# Patient Record
Sex: Female | Born: 2000 | Race: Black or African American | Hispanic: No | Smoking: Never smoker
Health system: Southern US, Community
[De-identification: ages and names within clinical notes are randomized; demographics above are authoritative.]

## PROBLEM LIST (undated history)

## (undated) ENCOUNTER — Inpatient Hospital Stay (HOSPITAL_COMMUNITY): Payer: Self-pay

## (undated) ENCOUNTER — Ambulatory Visit (HOSPITAL_COMMUNITY): Admission: EM

## (undated) DIAGNOSIS — F32A Depression, unspecified: Secondary | ICD-10-CM

## (undated) DIAGNOSIS — O009 Unspecified ectopic pregnancy without intrauterine pregnancy: Secondary | ICD-10-CM

## (undated) DIAGNOSIS — R4689 Other symptoms and signs involving appearance and behavior: Secondary | ICD-10-CM

## (undated) DIAGNOSIS — N83209 Unspecified ovarian cyst, unspecified side: Secondary | ICD-10-CM

## (undated) DIAGNOSIS — F419 Anxiety disorder, unspecified: Secondary | ICD-10-CM

## (undated) DIAGNOSIS — R519 Headache, unspecified: Secondary | ICD-10-CM

## (undated) DIAGNOSIS — O24419 Gestational diabetes mellitus in pregnancy, unspecified control: Secondary | ICD-10-CM

## (undated) DIAGNOSIS — F909 Attention-deficit hyperactivity disorder, unspecified type: Secondary | ICD-10-CM

## (undated) HISTORY — DX: Attention-deficit hyperactivity disorder, unspecified type: F90.9

## (undated) HISTORY — DX: Other symptoms and signs involving appearance and behavior: R46.89

---
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
## 2020-01-04 ENCOUNTER — Ambulatory Visit (HOSPITAL_COMMUNITY)
Admission: EM | Admit: 2020-01-04 | Discharge: 2020-01-04 | Disposition: A | Discharge status: Home / Self Care | Payer: Medicaid Other | Attending: Family Medicine | Admitting: Family Medicine

## 2020-01-04 ENCOUNTER — Encounter (HOSPITAL_COMMUNITY): Payer: Self-pay | Admitting: Emergency Medicine

## 2020-01-04 ENCOUNTER — Other Ambulatory Visit: Payer: Self-pay

## 2020-01-04 DIAGNOSIS — Z3202 Encounter for pregnancy test, result negative: Secondary | ICD-10-CM | POA: Diagnosis not present

## 2020-01-04 DIAGNOSIS — Z113 Encounter for screening for infections with a predominantly sexual mode of transmission: Secondary | ICD-10-CM | POA: Diagnosis present

## 2020-01-04 DIAGNOSIS — R197 Diarrhea, unspecified: Secondary | ICD-10-CM | POA: Diagnosis not present

## 2020-01-04 LAB — POCT URINALYSIS DIPSTICK, ED / UC
Bilirubin Urine: NEGATIVE
Glucose, UA: NEGATIVE mg/dL
Hgb urine dipstick: NEGATIVE
Ketones, ur: NEGATIVE mg/dL
Leukocytes,Ua: NEGATIVE
Nitrite: NEGATIVE
Protein, ur: 30 mg/dL — AB
Specific Gravity, Urine: >=1.03 (ref 1.005–1.030)
Urobilinogen, UA: 0.2 mg/dL (ref 0.0–1.0)
pH: 5.5 (ref 5.0–8.0)

## 2020-01-04 LAB — HIV ANTIBODY (ROUTINE TESTING W REFLEX): HIV Screen 4th Generation wRfx: NONREACTIVE

## 2020-01-04 LAB — POC URINE PREG, ED: Preg Test, Ur: NEGATIVE

## 2020-01-04 NOTE — ED Provider Notes (Signed)
[facility]    CSN: [id number] Arrival date & time: 01/04/20  9379      History   Chief Complaint No chief complaint on file. pregnancy test, STD testing, Diarrhea  HPI Bethany Avery is a 19 y.o. female presenting today for pregnancy testing and STD testing.  Patient reports that her last menstrual cycle was 11/30/2019.  She is not on birth control.  Took 2 pregnancy tests at home reporting 1 was positive hormone was negative.  She is here for clarification.  She denies any pelvic symptoms of abdominal pain, abnormal bleeding, abnormal discharge.  Denies urinary symptoms of dysuria, increased frequency or urgency.  She would like to be screened for STDs.  Does report some diarrhea which has been going on for approximately 2 weeks.  Typically will have bowel movements after eating anything, denies blood in stool.  Denies associated abdominal pain.  Denies nausea or vomiting.  Denies fevers.  HPI  Past Medical History:  Diagnosis Date  . ADHD (attention deficit hyperactivity disorder)   . Gonorrhea 02/02/2018    Patient Active Problem List   Diagnosis Date Noted  . General counseling and advice on female contraception 02/02/2018  . Gonorrhea 02/02/2018  . Adolescent behavior problem     History reviewed. No pertinent surgical history.  OB History    Gravida  1   Para      Term      Preterm      AB  1   Living        SAB  1   TAB      Ectopic      Multiple      Live Births               Home Medications    Prior to Admission medications   Medication Sig Start Date End Date Taking? Authorizing Provider  fluticasone (FLONASE) 50 MCG/ACT nasal spray Place 1-2 sprays into both nostrils daily for 7 days. 11/23/19 11/30/19  Tambra Muller, Junius Creamer, PA-C    Family History Family History  Problem Relation Age of Onset  . Healthy Mother   . Healthy Father     Social History Social History   Tobacco Use  . Smoking status: Never Smoker  . Smokeless  tobacco: Never Used  Vaping Use  . Vaping Use: Never used  Substance Use Topics  . Alcohol use: No  . Drug use: Yes    Types: Marijuana    Comment: LAST SMOKED ON  MONDAY     Allergies   Nickel   Review of Systems Review of Systems  Constitutional: Negative for fever.  Respiratory: Negative for shortness of breath.   Cardiovascular: Negative for chest pain.  Gastrointestinal: Positive for diarrhea. Negative for abdominal pain, nausea and vomiting.  Genitourinary: Positive for menstrual problem. Negative for dysuria, flank pain, genital sores, hematuria, vaginal bleeding, vaginal discharge and vaginal pain.  Musculoskeletal: Negative for back pain.  Skin: Negative for rash.  Neurological: Negative for dizziness, light-headedness and headaches.     Physical Exam Triage Vital Signs ED Triage Vitals  Enc Vitals Group     BP 01/04/20 1037 105/76     Pulse Rate 01/04/20 1037 73     Resp 01/04/20 1037 17     Temp 01/04/20 1037 98.4 F (36.9 C)     Temp Source 01/04/20 1037 Oral     SpO2 01/04/20 1037 100 %     Weight --  Height --      Head Circumference --      Peak Flow --      Pain Score 01/04/20 1036 0     Pain Loc --      Pain Edu? --      Excl. in GC? --    No data found.  Updated Vital Signs BP 105/76 (BP Location: Right Arm)   Pulse 73   Temp 98.4 F (36.9 C) (Oral)   Resp 17   LMP 11/30/2019 (Exact Date)   SpO2 100%   Visual Acuity Right Eye Distance:   Left Eye Distance:   Bilateral Distance:    Right Eye Near:   Left Eye Near:    Bilateral Near:     Physical Exam Vitals and nursing note reviewed.  Constitutional:      Appearance: She is well-developed.     Comments: No acute distress  HENT:     Head: Normocephalic and atraumatic.     Nose: Nose normal.  Eyes:     Conjunctiva/sclera: Conjunctivae normal.  Cardiovascular:     Rate and Rhythm: Normal rate.  Pulmonary:     Effort: Pulmonary effort is normal. No respiratory distress.      Comments: Breathing comfortably at rest, CTABL, no wheezing, rales or other adventitious sounds auscultated Abdominal:     General: There is no distension.     Comments: Soft, nondistended, nontender to light and deep palpation throughout abdomen  Musculoskeletal:        General: Normal range of motion.     Cervical back: Neck supple.  Skin:    General: Skin is warm and dry.  Neurological:     Mental Status: She is alert and oriented to person, place, and time.      UC Treatments / Results  Labs (all labs ordered are listed, but only abnormal results are displayed) Labs Reviewed  POCT URINALYSIS DIPSTICK, ED / UC - Abnormal; Notable for the following components:      Result Value   Protein, ur 30 (*)    All other components within normal limits  HIV ANTIBODY (ROUTINE TESTING W REFLEX)  RPR  POC URINE PREG, ED  CERVICOVAGINAL ANCILLARY ONLY    EKG   Radiology No results found.  Procedures Procedures (including critical care time)  Medications Ordered in UC Medications - No data to display  Initial Impression / Assessment and Plan / UC Course  I have reviewed the triage vital signs and the nursing notes.  Pertinent labs & imaging results that were available during my care of the patient were reviewed by me and considered in my medical decision making (see chart for details).     Pregnancy test negative, UA unremarkable.  Vaginal swab pending for screening of gonorrhea chlamydia and trichomonas.  HIV and RPR blood work pending.  Will call with results and alter treatment as needed.  Recommending probiotics pushing fluids and continued monitoring of diarrhea.  Without abdominal pain.  Symptoms x2 weeks, do not suspect infectious etiology.  Discussed strict return precautions. Patient verbalized understanding and is agreeable with plan.  Final Clinical Impressions(s) / UC Diagnoses   Final diagnoses:  Encounter for pregnancy test with result negative  Screen  for STD (sexually transmitted disease)  Diarrhea, unspecified type     Discharge Instructions     Pregnancy test negative  We are testing you for Gonorrhea, Chlamydia, Trichomonas, Yeast and Bacterial Vaginosis. We will call you if anything is positive and let  you know if you require any further treatment. Please inform partners of any positive results.   Diarrhea-try daily probiotics, daily yogurt/activity, avoid problematic foods-see attached, drink plenty of fluids  Please return if symptoms not improving with treatment, development of fever, nausea, vomiting, abdominal pain.      ED Prescriptions    None     PDMP not reviewed this encounter.   Lew Dawes, PA-C 01/04/20 1124

## 2020-01-04 NOTE — ED Triage Notes (Signed)
Pt presents with concern of pregnancy. States she has taken two home pregnancy test, one resulted negative, and one resulted positive.  Denies vaginal irritation, itching, discharge, burning, blood in urine, or dysuria.   States she has has diarrhea for 1-2 wks. C/o of abdominal and lower back pain and headache on and off. Denies N or V, loss of taste or smell. Pt would like to have pregnancy test and full STD testing.

## 2020-01-04 NOTE — Discharge Instructions (Signed)
Pregnancy test negative  We are testing you for Gonorrhea, Chlamydia, Trichomonas, Yeast and Bacterial Vaginosis. We will call you if anything is positive and let you know if you require any further treatment. Please inform partners of any positive results.   Diarrhea-try daily probiotics, daily yogurt/activity, avoid problematic foods-see attached, drink plenty of fluids  Please return if symptoms not improving with treatment, development of fever, nausea, vomiting, abdominal pain.

## 2020-01-05 ENCOUNTER — Telehealth (HOSPITAL_COMMUNITY): Payer: Self-pay

## 2020-01-05 LAB — CERVICOVAGINAL ANCILLARY ONLY
Bacterial Vaginitis (gardnerella): POSITIVE — AB
Candida Glabrata: NEGATIVE
Candida Vaginitis: NEGATIVE
Chlamydia: NEGATIVE
Comment: NEGATIVE
Comment: NEGATIVE
Comment: NEGATIVE
Comment: NEGATIVE
Comment: NEGATIVE
Comment: NORMAL
Neisseria Gonorrhea: POSITIVE — AB
Trichomonas: NEGATIVE

## 2020-01-05 LAB — RPR: RPR Ser Ql: NONREACTIVE

## 2020-01-05 MED ORDER — METRONIDAZOLE 500 MG PO TABS: 500.0000 mg | ORAL_TABLET | Freq: Two times a day (BID) | ORAL | 0 refills | Status: DC

## 2020-01-09 ENCOUNTER — Other Ambulatory Visit: Payer: Self-pay

## 2020-01-09 ENCOUNTER — Ambulatory Visit (HOSPITAL_COMMUNITY)
Admission: EM | Admit: 2020-01-09 | Discharge: 2020-01-09 | Disposition: A | Discharge status: Home / Self Care | Payer: Medicaid Other | Attending: Urgent Care | Admitting: Urgent Care

## 2020-01-09 DIAGNOSIS — A549 Gonococcal infection, unspecified: Secondary | ICD-10-CM

## 2020-01-09 DIAGNOSIS — Z1152 Encounter for screening for COVID-19: Secondary | ICD-10-CM | POA: Diagnosis present

## 2020-01-09 MED ORDER — LIDOCAINE HCL (PF) 1 % IJ SOLN
INTRAMUSCULAR | Status: AC
  Filled 2020-01-09: qty 2

## 2020-01-09 MED ORDER — CEFTRIAXONE SODIUM 500 MG IJ SOLR
INTRAMUSCULAR | Status: AC
  Filled 2020-01-09: qty 500

## 2020-01-09 MED ORDER — CEFTRIAXONE SODIUM 500 MG IJ SOLR
500.0000 mg | Freq: Once | INTRAMUSCULAR | Status: AC
  Administered 2020-01-09: 500 mg via INTRAMUSCULAR

## 2020-01-09 NOTE — ED Triage Notes (Addendum)
Pt presents for STD treatment. Pt seen on 01/04/20.   Pt present for COVID testing to return to work after travel. Denies any sxs related to covid nor has pt been exposed.

## 2020-01-10 LAB — SARS CORONAVIRUS 2 (TAT 6-24 HRS): SARS Coronavirus 2: NEGATIVE

## 2020-01-14 ENCOUNTER — Ambulatory Visit (HOSPITAL_COMMUNITY)
Admission: EM | Admit: 2020-01-14 | Discharge: 2020-01-14 | Disposition: A | Discharge status: Home / Self Care | Payer: Medicaid Other | Attending: Urgent Care | Admitting: Urgent Care

## 2020-01-14 ENCOUNTER — Other Ambulatory Visit: Payer: Self-pay

## 2020-01-14 ENCOUNTER — Encounter (HOSPITAL_COMMUNITY): Payer: Self-pay

## 2020-01-14 DIAGNOSIS — R05 Cough: Secondary | ICD-10-CM | POA: Insufficient documentation

## 2020-01-14 DIAGNOSIS — R197 Diarrhea, unspecified: Secondary | ICD-10-CM | POA: Diagnosis present

## 2020-01-14 DIAGNOSIS — Z3202 Encounter for pregnancy test, result negative: Secondary | ICD-10-CM | POA: Diagnosis not present

## 2020-01-14 DIAGNOSIS — R079 Chest pain, unspecified: Secondary | ICD-10-CM | POA: Insufficient documentation

## 2020-01-14 DIAGNOSIS — R5383 Other fatigue: Secondary | ICD-10-CM | POA: Diagnosis not present

## 2020-01-14 DIAGNOSIS — R0602 Shortness of breath: Secondary | ICD-10-CM | POA: Diagnosis not present

## 2020-01-14 DIAGNOSIS — R5381 Other malaise: Secondary | ICD-10-CM | POA: Diagnosis not present

## 2020-01-14 DIAGNOSIS — B349 Viral infection, unspecified: Secondary | ICD-10-CM | POA: Insufficient documentation

## 2020-01-14 DIAGNOSIS — R1084 Generalized abdominal pain: Secondary | ICD-10-CM | POA: Diagnosis not present

## 2020-01-14 DIAGNOSIS — R059 Cough, unspecified: Secondary | ICD-10-CM

## 2020-01-14 DIAGNOSIS — R52 Pain, unspecified: Secondary | ICD-10-CM

## 2020-01-14 DIAGNOSIS — U071 COVID-19: Secondary | ICD-10-CM | POA: Insufficient documentation

## 2020-01-14 DIAGNOSIS — R519 Headache, unspecified: Secondary | ICD-10-CM | POA: Diagnosis not present

## 2020-01-14 LAB — POC URINE PREG, ED: Preg Test, Ur: NEGATIVE

## 2020-01-14 MED ORDER — PROMETHAZINE-DM 6.25-15 MG/5ML PO SYRP
5.0000 mL | ORAL_SOLUTION | Freq: Every evening | ORAL | 0 refills | Status: DC | PRN
Start: 2020-01-14 — End: 2020-04-02

## 2020-01-14 MED ORDER — BENZONATATE 100 MG PO CAPS: 100.0000 mg | ORAL_CAPSULE | Freq: Three times a day (TID) | ORAL | 0 refills | Status: DC | PRN

## 2020-01-14 MED ORDER — ALBUTEROL SULFATE HFA 108 (90 BASE) MCG/ACT IN AERS: 1.0000 | INHALATION_SPRAY | Freq: Four times a day (QID) | RESPIRATORY_TRACT | 0 refills | Status: DC | PRN

## 2020-01-14 MED ORDER — LOPERAMIDE HCL 2 MG PO CAPS
4.0000 mg | ORAL_CAPSULE | Freq: Two times a day (BID) | ORAL | 0 refills | Status: DC | PRN
Start: 2020-01-14 — End: 2020-04-02

## 2020-01-14 NOTE — Discharge Instructions (Addendum)
Please make sure you bring back the stool sample today before closing time of 6 PM.  We will notify you of your COVID-19 test results as they arrive and may take between 24 to 48 hours.  I encourage you to sign up for MyChart if you have not already done so as this can be the easiest way for [place] to communicate results to you online or through a phone app.  In the meantime, if you develop worsening symptoms including fever, chest pain, shortness of breath despite our current treatment plan then please report to the emergency room as this may be a sign of worsening status from possible COVID-19 infection.  Otherwise, we will manage this as a viral syndrome. For sore throat or cough try using a honey-based tea. Use 3 teaspoons of honey with juice squeezed from half lemon. Place shaved pieces of ginger into 1/2-1 cup of water and warm over stove top. Then mix the ingredients and repeat every 4 hours as needed. Please take Tylenol 500mg -650mg  every 6 hours for aches and pains, fevers. Hydrate very well with at least 2 liters of water. Eat light meals such as soups to replenish electrolytes and soft fruits, veggies. Start an antihistamine like Zyrtec, Allegra or Claritin for postnasal drainage, sinus congestion.  You can take this together with pseudoephedrine (Sudafed) at a dose of 60 mg 2-3 times a day as needed for the same kind of congestion.

## 2020-01-14 NOTE — ED Provider Notes (Signed)
[facility]   MRN: [id number] DOB: 2001/01/14  Subjective:   Bethany Avery is a 19 y.o. female presenting for 2 to 3-week history of persistent diarrhea, states that she has more than 10 bowel movements a day.  She is also had headaches, body aches, chest pain, shortness of breath, coughing, general malaise and fatigue.  She underwent a course of doxycycline in July and recently got a ceftriaxone injection as well for gonorrhea coverage.  Denies any bloody stools.  Patient would like to get a pregnancy test.  No current facility-administered medications for this encounter.  Current Outpatient Medications:  .  fluticasone (FLONASE) 50 MCG/ACT nasal spray, Place 1-2 sprays into both nostrils daily for 7 days., Disp: 1 g, Rfl: 0 .  metroNIDAZOLE (FLAGYL) 500 MG tablet, Take 1 tablet (500 mg total) by mouth 2 (two) times daily., Disp: 14 tablet, Rfl: 0   Allergies  Allergen Reactions  . Nickel Rash    Past Medical History:  Diagnosis Date  . ADHD (attention deficit hyperactivity disorder)   . Gonorrhea 02/02/2018     History reviewed. No pertinent surgical history.  Family History  Problem Relation Age of Onset  . Healthy Mother   . Healthy Father     Social History   Tobacco Use  . Smoking status: Never Smoker  . Smokeless tobacco: Never Used  Vaping Use  . Vaping Use: Never used  Substance Use Topics  . Alcohol use: No  . Drug use: Yes    Types: Marijuana    Comment: LAST SMOKED ON  MONDAY    ROS   Objective:   Vitals: BP 105/82 (BP Location: Left Arm)   Pulse 82   Temp 98.3 F (36.8 C) (Oral)   Resp 18   SpO2 100%   Physical Exam Constitutional:      General: She is not in acute distress.    Appearance: Normal appearance. She is well-developed and normal weight. She is not ill-appearing, toxic-appearing or diaphoretic.  HENT:     Head: Normocephalic and atraumatic.     Right Ear: External ear normal.     Left Ear: External ear normal.      Nose: Nose normal.     Mouth/Throat:     Mouth: Mucous membranes are moist.     Pharynx: Oropharynx is clear.  Eyes:     General: No scleral icterus.       Right eye: No discharge.        Left eye: No discharge.     Extraocular Movements: Extraocular movements intact.     Conjunctiva/sclera: Conjunctivae normal.     Pupils: Pupils are equal, round, and reactive to light.  Cardiovascular:     Rate and Rhythm: Normal rate and regular rhythm.     Pulses: Normal pulses.     Heart sounds: Normal heart sounds. No murmur heard.  No friction rub. No gallop.   Pulmonary:     Effort: Pulmonary effort is normal. No respiratory distress.     Breath sounds: Normal breath sounds. No stridor. No wheezing, rhonchi or rales.  Abdominal:     General: Bowel sounds are normal. There is no distension.     Palpations: Abdomen is soft. There is no mass.     Tenderness: There is abdominal tenderness (generalized, mild). There is no right CVA tenderness, left CVA tenderness, guarding or rebound.  Skin:    General: Skin is warm and dry.     Coloration: Skin is not pale.  Findings: No rash.  Neurological:     General: No focal deficit present.     Mental Status: She is alert and oriented to person, place, and time.     Cranial Nerves: No cranial nerve deficit.     Motor: No weakness.     Coordination: Coordination normal.     Gait: Gait normal.     Deep Tendon Reflexes: Reflexes normal.  Psychiatric:        Mood and Affect: Mood normal.        Behavior: Behavior normal.        Thought Content: Thought content normal.        Judgment: Judgment normal.     Results for orders placed or performed during the hospital encounter of 01/14/20 (from the past 24 hour(s))  POC urine preg, ED (not at [facility])     Status: None   Collection Time: 01/14/20 12:04 PM  Result Value Ref Range   Preg Test, Ur NEGATIVE NEGATIVE    Assessment and Plan :   PDMP not reviewed this encounter.  1. Viral illness   2.  Diarrhea, unspecified type   3. Generalized abdominal pain   4. Body aches   5. Cough   6. Generalized headaches     Patient provided stool sample in clinic, had a formed stool and therefore I canceled the C. difficile test.  Pregnancy test is negative. Will manage for viral illness such as viral syndrome, COVID-19. Counseled patient on nature of COVID-19 including modes of transmission, diagnostic testing, management and supportive care.  Offered scripts for symptomatic relief. COVID 19 testing is pending. Counseled patient on potential for adverse effects with medications prescribed/recommended today, ER and return-to-clinic precautions discussed, patient verbalized understanding.     Wallis Bamberg, PA-C 01/14/20 1300

## 2020-01-14 NOTE — ED Triage Notes (Signed)
Pt presents for COVID testing and pregnancy test. States having headache, diarrhea and body aches x 2 days. Pt has not take any medications for the complaints.

## 2020-01-15 LAB — GASTROINTESTINAL PANEL BY PCR, STOOL (REPLACES STOOL CULTURE)

## 2020-01-15 LAB — SARS CORONAVIRUS 2 (TAT 6-24 HRS): SARS Coronavirus 2: POSITIVE — AB

## 2020-02-05 ENCOUNTER — Ambulatory Visit (INDEPENDENT_AMBULATORY_CARE_PROVIDER_SITE_OTHER): Payer: Medicaid Other | Admitting: *Deleted

## 2020-02-05 ENCOUNTER — Other Ambulatory Visit: Payer: Self-pay

## 2020-02-05 ENCOUNTER — Encounter: Payer: Self-pay | Admitting: General Practice

## 2020-02-05 VITALS — BP 119/75 | HR 70 | Temp 97.5°F | Ht 64.0 in | Wt 209.8 lb

## 2020-02-05 DIAGNOSIS — Z3202 Encounter for pregnancy test, result negative: Secondary | ICD-10-CM | POA: Diagnosis not present

## 2020-02-05 LAB — POCT URINE PREGNANCY: Preg Test, Ur: NEGATIVE

## 2020-02-05 NOTE — Progress Notes (Signed)
   Bethany Avery presents today for UPT. She has no unusual complaints. LMP: 01/02/2020    OBJECTIVE: Appears well, in no apparent distress.  OB History    Gravida  1   Para      Term      Preterm      AB  1   Living        SAB  1   TAB      Ectopic      Multiple      Live Births             Home UPT Result: Faint positive In-Office UPT result: Negative x 2 I have reviewed the patient's medical, obstetrical, social, and family histories, and medications.   ASSESSMENT: Negative pregnancy test  PLAN Patient to return to clinic for repeat UPT, if no menses for this month.  Clovis Pu, RN

## 2020-03-03 ENCOUNTER — Encounter (HOSPITAL_COMMUNITY): Payer: Self-pay | Admitting: *Deleted

## 2020-03-03 ENCOUNTER — Ambulatory Visit (HOSPITAL_COMMUNITY)
Admission: EM | Admit: 2020-03-03 | Discharge: 2020-03-03 | Disposition: A | Discharge status: Home / Self Care | Payer: Medicaid Other | Attending: Family Medicine | Admitting: Family Medicine

## 2020-03-03 ENCOUNTER — Other Ambulatory Visit: Payer: Self-pay

## 2020-03-03 DIAGNOSIS — R1084 Generalized abdominal pain: Secondary | ICD-10-CM | POA: Insufficient documentation

## 2020-03-03 DIAGNOSIS — R059 Cough, unspecified: Secondary | ICD-10-CM | POA: Diagnosis not present

## 2020-03-03 DIAGNOSIS — Z20822 Contact with and (suspected) exposure to covid-19: Secondary | ICD-10-CM | POA: Insufficient documentation

## 2020-03-03 DIAGNOSIS — J029 Acute pharyngitis, unspecified: Secondary | ICD-10-CM

## 2020-03-03 DIAGNOSIS — Z3202 Encounter for pregnancy test, result negative: Secondary | ICD-10-CM | POA: Diagnosis not present

## 2020-03-03 DIAGNOSIS — Z113 Encounter for screening for infections with a predominantly sexual mode of transmission: Secondary | ICD-10-CM

## 2020-03-03 DIAGNOSIS — Z79899 Other long term (current) drug therapy: Secondary | ICD-10-CM | POA: Insufficient documentation

## 2020-03-03 DIAGNOSIS — Z202 Contact with and (suspected) exposure to infections with a predominantly sexual mode of transmission: Secondary | ICD-10-CM | POA: Insufficient documentation

## 2020-03-03 DIAGNOSIS — R109 Unspecified abdominal pain: Secondary | ICD-10-CM | POA: Insufficient documentation

## 2020-03-03 LAB — POCT URINALYSIS DIPSTICK, ED / UC
Bilirubin Urine: NEGATIVE
Glucose, UA: NEGATIVE mg/dL
Leukocytes,Ua: NEGATIVE
Nitrite: NEGATIVE
Protein, ur: NEGATIVE mg/dL
Specific Gravity, Urine: 1.025 (ref 1.005–1.030)
Urobilinogen, UA: 0.2 mg/dL (ref 0.0–1.0)
pH: 6.5 (ref 5.0–8.0)

## 2020-03-03 LAB — SARS CORONAVIRUS 2 (TAT 6-24 HRS): SARS Coronavirus 2: NEGATIVE

## 2020-03-03 LAB — POC URINE PREG, ED: Preg Test, Ur: NEGATIVE

## 2020-03-03 NOTE — Discharge Instructions (Addendum)
Go to the Emergency Department for evaluation of your abdominal pain.    Your urine pregnancy test is negative.

## 2020-03-03 NOTE — ED Provider Notes (Signed)
[facility]    CSN: [id number] Arrival date & time: 03/03/20  1017      History   Chief Complaint Chief Complaint  Patient presents with  . Cough  . Sore Throat  . Exposure to STD    HPI Bethany Avery is a 19 y.o. female.   Patient presents with 2-week history of cough and sore throat.  She also reports abdominal pain since last night.  She also request an STD test.  She states she thinks her last menstrual cycle was in August.  She denies fever, chills, rash, shortness of breath, vomiting, diarrhea, vaginal discharge, pelvic pain, or other symptoms.  No treatments attempted at home.  COVID positive on 01/14/2020.  Her medical history includes gonorrhea and ADHD.  The history is provided by the patient.    Past Medical History:  Diagnosis Date  . ADHD (attention deficit hyperactivity disorder)   . Gonorrhea 02/02/2018    Patient Active Problem List   Diagnosis Date Noted  . General counseling and advice on female contraception 02/02/2018  . Gonorrhea 02/02/2018  . Adolescent behavior problem     History reviewed. No pertinent surgical history.  OB History    Gravida  1   Para      Term      Preterm      AB  1   Living        SAB  1   TAB      Ectopic      Multiple      Live Births               Home Medications    Prior to Admission medications   Medication Sig Start Date End Date Taking? Authorizing Provider  albuterol (VENTOLIN HFA) 108 (90 Base) MCG/ACT inhaler Inhale 1-2 puffs into the lungs every 6 (six) hours as needed for wheezing or shortness of breath. 01/14/20  Yes Wallis Bamberg, PA-C  benzonatate (TESSALON) 100 MG capsule Take 1-2 capsules (100-200 mg total) by mouth 3 (three) times daily as needed. Patient not taking: Reported on 02/05/2020 01/14/20   Wallis Bamberg, PA-C  fluticasone [facility]) 50 MCG/ACT nasal spray Place 1-2 sprays into both nostrils daily for 7 days. 11/23/19 11/30/19  Wieters, Hallie C, PA-C  loperamide  (IMODIUM) 2 MG capsule Take 2 capsules (4 mg total) by mouth 2 (two) times daily as needed for diarrhea or loose stools. Patient not taking: Reported on 02/05/2020 01/14/20   Wallis Bamberg, PA-C  metroNIDAZOLE (FLAGYL) 500 MG tablet Take 1 tablet (500 mg total) by mouth 2 (two) times daily. Patient not taking: Reported on 02/05/2020 01/05/20   Merrilee Jansky, MD  promethazine-dextromethorphan (PROMETHAZINE-DM) 6.25-15 MG/5ML syrup Take 5 mLs by mouth at bedtime as needed for cough. Patient not taking: Reported on 02/05/2020 01/14/20   Wallis Bamberg, PA-C    Family History Family History  Problem Relation Age of Onset  . Healthy Mother   . Healthy Father     Social History Social History   Tobacco Use  . Smoking status: Never Smoker  . Smokeless tobacco: Never Used  Vaping Use  . Vaping Use: Never used  Substance Use Topics  . Alcohol use: No  . Drug use: Not Currently    Types: Marijuana    Comment: LAST SMOKED ON  MONDAY     Allergies   Nickel   Review of Systems Review of Systems  Constitutional: Negative for chills and fever.  HENT: Positive for  sore throat. Negative for ear pain.   Eyes: Negative for pain and visual disturbance.  Respiratory: Positive for cough. Negative for shortness of breath.   Cardiovascular: Negative for chest pain and palpitations.  Gastrointestinal: Positive for abdominal pain. Negative for diarrhea, nausea and vomiting.  Genitourinary: Negative for dysuria and hematuria.  Musculoskeletal: Negative for arthralgias and back pain.  Skin: Negative for color change and rash.  Neurological: Negative for seizures and syncope.  All other systems reviewed and are negative.    Physical Exam Triage Vital Signs ED Triage Vitals  Enc Vitals Group     BP 03/03/20 1118 115/66     Pulse Rate 03/03/20 1118 79     Resp 03/03/20 1118 18     Temp 03/03/20 1118 98.6 F (37 C)     Temp Source 03/03/20 1118 Oral     SpO2 03/03/20 1118 100 %     Weight --        Height 03/03/20 1123 5\' 5"  (1.651 m)     Head Circumference --      Peak Flow --      Pain Score 03/03/20 1121 8     Pain Loc --      Pain Edu? --      Excl. in GC? --    No data found.  Updated Vital Signs BP 115/66 (BP Location: Right Arm)   Pulse 79   Temp 98.6 F (37 C) (Oral)   Resp 18   Ht 5\' 5"  (1.651 m)   LMP 01/05/2020   SpO2 100%   BMI 34.91 kg/m   Visual Acuity Right Eye Distance:   Left Eye Distance:   Bilateral Distance:    Right Eye Near:   Left Eye Near:    Bilateral Near:     Physical Exam Vitals and nursing note reviewed.  Constitutional:      General: She is not in acute distress.    Appearance: She is well-developed. She is not ill-appearing.  HENT:     Head: Normocephalic and atraumatic.     Right Ear: Tympanic membrane normal.     Left Ear: Tympanic membrane normal.     Nose: Nose normal.     Mouth/Throat:     Mouth: Mucous membranes are moist.     Pharynx: Oropharynx is clear.  Eyes:     Conjunctiva/sclera: Conjunctivae normal.  Cardiovascular:     Rate and Rhythm: Normal rate and regular rhythm.     Heart sounds: Normal heart sounds.  Pulmonary:     Effort: Pulmonary effort is normal. No respiratory distress.     Breath sounds: Normal breath sounds.  Abdominal:     General: Bowel sounds are normal.     Palpations: Abdomen is soft.     Tenderness: There is generalized abdominal tenderness. There is no right CVA tenderness, left CVA tenderness, guarding or rebound.  Musculoskeletal:     Cervical back: Neck supple.  Skin:    General: Skin is warm and dry.     Findings: No rash.  Neurological:     General: No focal deficit present.     Mental Status: She is alert and oriented to person, place, and time.     Gait: Gait normal.  Psychiatric:        Mood and Affect: Mood normal.        Behavior: Behavior normal.      UC Treatments / Results  Labs (all labs ordered are listed, but only abnormal  results are displayed) Labs  Reviewed  POCT URINALYSIS DIPSTICK, ED / UC - Abnormal; Notable for the following components:      Result Value   Ketones, ur TRACE (*)    Hgb urine dipstick TRACE (*)    All other components within normal limits  SARS CORONAVIRUS 2 (TAT 6-24 HRS)  POC URINE PREG, ED  CERVICOVAGINAL ANCILLARY ONLY    EKG   Radiology No results found.  Procedures Procedures (including critical care time)  Medications Ordered in UC Medications - No data to display  Initial Impression / Assessment and Plan / UC Course  I have reviewed the triage vital signs and the nursing notes.  Pertinent labs & imaging results that were available during my care of the patient were reviewed by me and considered in my medical decision making (see chart for details).   Generalized abdominal pain, cough.  Urine pregnancy negative.  Sending patient to the ED for evaluation.     Final Clinical Impressions(s) / UC Diagnoses   Final diagnoses:  Generalized abdominal pain  Cough     Discharge Instructions     Go to the Emergency Department for evaluation of your abdominal pain.    Your urine pregnancy test is negative.        ED Prescriptions    None     PDMP not reviewed this encounter.   Mickie Bail, NP 03/03/20 1216

## 2020-03-03 NOTE — ED Triage Notes (Signed)
PT reports cough and sore throat 2 weeks ago.Pt reports testing positive 3 months ago. Pt also has ABD pain that started last night. Pt denies diarrhea .

## 2020-03-03 NOTE — ED Triage Notes (Signed)
After Triage was c PT requested a STD check. Pt denies any vag discharge

## 2020-03-04 LAB — CERVICOVAGINAL ANCILLARY ONLY
Bacterial Vaginitis (gardnerella): POSITIVE — AB
Candida Glabrata: NEGATIVE
Candida Vaginitis: POSITIVE — AB
Chlamydia: POSITIVE — AB
Comment: NEGATIVE
Comment: NEGATIVE
Comment: NEGATIVE
Comment: NEGATIVE
Comment: NEGATIVE
Comment: NORMAL
Neisseria Gonorrhea: NEGATIVE
Trichomonas: NEGATIVE

## 2020-03-05 ENCOUNTER — Other Ambulatory Visit: Payer: Self-pay

## 2020-03-05 ENCOUNTER — Encounter (HOSPITAL_COMMUNITY): Payer: Self-pay | Admitting: Emergency Medicine

## 2020-03-05 ENCOUNTER — Telehealth (HOSPITAL_COMMUNITY): Payer: Self-pay | Admitting: Emergency Medicine

## 2020-03-05 ENCOUNTER — Emergency Department (HOSPITAL_COMMUNITY)
Admission: EM | Admit: 2020-03-05 | Discharge: 2020-03-05 | Disposition: A | Discharge status: Home / Self Care | Payer: Medicaid Other | Attending: Emergency Medicine | Admitting: Emergency Medicine

## 2020-03-05 DIAGNOSIS — A64 Unspecified sexually transmitted disease: Secondary | ICD-10-CM | POA: Insufficient documentation

## 2020-03-05 DIAGNOSIS — Z5321 Procedure and treatment not carried out due to patient leaving prior to being seen by health care provider: Secondary | ICD-10-CM | POA: Insufficient documentation

## 2020-03-05 DIAGNOSIS — R1030 Lower abdominal pain, unspecified: Secondary | ICD-10-CM | POA: Diagnosis not present

## 2020-03-05 DIAGNOSIS — M545 Low back pain, unspecified: Secondary | ICD-10-CM | POA: Diagnosis not present

## 2020-03-05 LAB — I-STAT BETA HCG BLOOD, ED (MC, WL, AP ONLY): I-stat hCG, quantitative: <5 m[IU]/mL (ref <5)

## 2020-03-05 MED ORDER — AZITHROMYCIN 250 MG PO TABS
1000.0000 mg | ORAL_TABLET | Freq: Once | ORAL | 0 refills | Status: AC
Start: 2020-03-05 — End: 2020-03-05

## 2020-03-05 MED ORDER — FLUCONAZOLE 150 MG PO TABS
150.0000 mg | ORAL_TABLET | Freq: Once | ORAL | 0 refills | Status: AC
Start: 2020-03-05 — End: 2020-03-05

## 2020-03-05 MED ORDER — METRONIDAZOLE 500 MG PO TABS
500.0000 mg | ORAL_TABLET | Freq: Two times a day (BID) | ORAL | 0 refills | Status: DC
Start: 2020-03-05 — End: 2020-04-26

## 2020-03-05 NOTE — ED Notes (Signed)
Pt left stated that UC called her with a prescription.

## 2020-03-05 NOTE — ED Triage Notes (Signed)
Pt arrives to ED with comp;laints of exposure to STDs. Pt was swabbed at UC on 10/10 and came back positive for chlamydia, BV, and CV. Pt complaints of lower abd and lower back pain. Was not treated at [facility]. Has missed period x2 months.

## 2020-04-02 ENCOUNTER — Ambulatory Visit (HOSPITAL_COMMUNITY)
Admission: EM | Admit: 2020-04-02 | Discharge: 2020-04-02 | Disposition: A | Discharge status: Home / Self Care | Payer: Medicaid Other | Attending: Registered Nurse | Admitting: Registered Nurse

## 2020-04-02 ENCOUNTER — Encounter (HOSPITAL_COMMUNITY): Payer: Self-pay | Admitting: Registered Nurse

## 2020-04-02 ENCOUNTER — Other Ambulatory Visit: Payer: Self-pay

## 2020-04-02 DIAGNOSIS — F4323 Adjustment disorder with mixed anxiety and depressed mood: Secondary | ICD-10-CM | POA: Diagnosis present

## 2020-04-02 DIAGNOSIS — Z733 Stress, not elsewhere classified: Secondary | ICD-10-CM | POA: Diagnosis not present

## 2020-04-02 DIAGNOSIS — Z638 Other specified problems related to primary support group: Secondary | ICD-10-CM | POA: Insufficient documentation

## 2020-04-02 NOTE — ED Provider Notes (Signed)
Behavioral Health Urgent Care Medical Screening Exam  Patient Name: Bethany Avery MRN: [id number] Date of Evaluation: 04/02/20 Chief Complaint:   Diagnosis:  Final diagnoses:  Adjustment disorder with mixed anxiety and depressed mood    History of Present illness: Bethany Avery is a 19 y.o. female patient presents to [facility] as walk in via law enforcement with complaints of depression and worsening stress.   Bethany Avery, 19 y.o., female patient seen face to face by this provider, consulted with Dr. Lucianne Muss; and chart reviewed on 04/02/20.  On evaluation Bethany Avery reports that she and her boyfriend have been having relationship problems relate to him cheating on her.  Patient states she had a miscarriage in June or July of this year and another female that her boyfriend was with just lost twins in October of this year, states that she has also had sexual transmitted disease related to his cheating.  States she wants to get her life together and leave but hates being alone.  States she feels she needs someone to talk to.  Reports she was brought in by the police "After me and my boyfriend argued last night I just wanted to be alone so I went to my grandfathers grave site; cause we was real close and just wanted to talk.  I stayed out there all night and when I got home he had the police there and had filed a missing person report."  Patient stated she had worn layers of clothing and stayed outside all night. States she walked to the grave site because she did not want anyone to be able to find her because she wanted to be alone.  Patient denies psychiatric history no (inpatient, outpatient, psychotropic, suicide attempt, or self harm).      During evaluation Bethany Avery is alert/oriented x 4; calm/cooperative; and mood is congruent with affect.  She does not appear to be responding to internal/external stimuli or delusional thoughts.  Patient denies  suicidal/self-harm/homicidal ideation, psychosis, and paranoia.  Patient answered question appropriately.     Psychiatric Specialty Exam  Presentation  General Appearance:Appropriate for Environment;Casual  Eye Contact:Good  Speech:Clear and Coherent;Normal Rate  Speech Volume:Normal  Handedness:Right   Mood and Affect  Mood:Anxious  Affect:Appropriate;Congruent   Thought Process  Thought Processes:Coherent;Goal Directed  Descriptions of Associations:Intact  Orientation:Full (Time, Place and Person)  Thought Content:Logical;WDL  Hallucinations:None  Ideas of Reference:None  Suicidal Thoughts:No  Homicidal Thoughts:No   Sensorium  Memory:Immediate Good;Recent Good;Remote Good  Judgment:Intact  Insight:Good;Present   Executive Functions  Concentration:Good  Attention Span:Good  Recall:Good  Fund of Knowledge:Good  Language:Good   Psychomotor Activity  Psychomotor Activity:Normal   Assets  Assets:Communication Skills;Desire for Improvement;Financial Resources/Insurance;Housing;Physical Health   Sleep  Sleep:Good  Number of hours: No data recorded  Physical Exam: Physical Exam Vitals and nursing note reviewed.  Constitutional:      General: She is not in acute distress.    Appearance: Normal appearance. She is not ill-appearing or diaphoretic.  HENT:     Head: Normocephalic and atraumatic.  Cardiovascular:     Rate and Rhythm: Normal rate and regular rhythm.  Pulmonary:     Effort: Pulmonary effort is normal.     Breath sounds: Normal breath sounds.  Musculoskeletal:        General: Normal range of motion.     Cervical back: Normal range of motion and neck supple.  Neurological:     General: No focal deficit present.  Mental Status: She is alert and oriented to person, place, and time.  Psychiatric:        Attention and Perception: Attention and perception normal. She does not perceive auditory or visual hallucinations.         Mood and Affect: Mood and affect normal.        Speech: Speech normal.        Behavior: Behavior normal. Behavior is cooperative.        Thought Content: Thought content normal. Thought content is not paranoid or delusional. Thought content does not include homicidal or suicidal ideation.        Cognition and Memory: Cognition and memory normal.        Judgment: Judgment normal.    Review of Systems  Psychiatric/Behavioral: Negative for memory loss. Depression: Stabe. Hallucinations: Denies. Substance abuse: Denies. Suicidal ideas: Denies. Nervous/anxious: Stable. Insomnia: Denies.   All other systems reviewed and are negative.  Blood pressure 121/73, pulse 76, temperature 98 F (36.7 C), temperature source Oral, resp. rate 18, height 5\' 5"  (1.651 m), weight 207 lb (93.9 kg), SpO2 100 %. Body mass index is 34.45 kg/m.  Musculoskeletal: Strength & Muscle Tone: within normal limits Gait & Station: normal Patient leans: N/A   [facility] MSE Discharge Disposition for Follow up and Recommendations: Based on my evaluation the patient does not appear to have an emergency medical condition and can be discharged with resources and follow up care in outpatient services for Individual Therapy     Discharge Instructions     Outpatient therapy is recommended.  You have been provided with a resource list, along with the following option for therapists that accept your insurance:  [facility]  [address], [place] [phone]  [facility] [address] [place], [place] Phone: [phone]  [facility] - You have been referred to [facility], [facility] for outpatient therapy.   [address] [phone]        [phone], NP 04/02/2020, 11:53 AM

## 2020-04-02 NOTE — ED Notes (Signed)
LOCKER 25  

## 2020-04-02 NOTE — ED Notes (Signed)
Patient A&O x 4, ambulatory. Patient discharged in no acute distress. Patient denied SI/HI, A/VH upon discharge. Patient verbalized understanding of all discharge instructions explained by staff, to include follow up appointments, RX's and safety plan. Pt belongings returned to patient from locker #25 intact. Patient escorted to lobby via staff for transport to destination. Safety maintained.  

## 2020-04-02 NOTE — Discharge Instructions (Addendum)
Outpatient therapy is recommended.  You have been provided with a resource list, along with the following option for therapists that accept your insurance:  [facility] - You have been referred to Leavy Cella, [facility] for outpatient therapy. She is seeing patients vis telehealth and is out of the [place] office.  [address] Call [phone] to schedule with [facility]  [address], [place] [address] [phone]  [facility] [address] [place], [place] [address] Phone: [phone]

## 2020-04-02 NOTE — BH Assessment (Signed)
Comprehensive Clinical Assessment (CCA) Screening, Triage and Referral Note  04/02/2020 Bethany Avery [id number]   Patient is a 19 y.o. female with a history of situational depression and past trauma who presents voluntarily to [facility] via [employer] for assessment.  Patient reports she got upset yesterday while talking with her boyfriend and she left the house.  When she did not return, her boyfriend called police to report missing person.  When patient returned home this morning, police were at the house.  Patient states she has been "going through a lot lately."  She reports she has been trying to find a therapist, however has had difficulty finding one.  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  Patient states that one year ago, yesterday she lost her grandfather.  They were very close and this has been difficult.  She went to his gravesite last night and states she stayed there "talking to him" through the night.  Patient also has a history of unhealthy relationships, starting at age 10.  She has lived with boyfriends from age 13 until now and has been cheated on multiple times.  This current boyfriend cheated and got another woman pregnant.  Patient had a miscarriage earlier this year, at [redacted] wks gestation and this other woman also lost twins this year.  Patient states she doesn't know why she let him move back in and recognizes this is an unhealthy relationship.  She states she feels overwhelmed at times, however she denies SI, HI and AVH.  She denies history of substance use.    Disposition: Per Assunta Found, NP patient is psychiatrically cleared.  The recommendation is that patient pursue outpatient therapy.  [facility].  She has also been provided with additional resources.   Chief  Complaint:  Chief Complaint  Patient presents with  . urgent emergent eval  . Anxiety  . Stress  . Depression   Visit Diagnosis: Adjustment Disorder with mixed anxiety and depressed mood                             R/O PTSD  Patient Reported Information How did you hear about [place]? Legal System   Referral name: Patient presents voluntarily via [employer].   Referral phone number: No data recorded Whom do you see for routine medical problems? I don't have a doctor   Practice/Facility Name: No data recorded  Practice/Facility Phone Number: No data recorded  Name of Contact: No data recorded  Contact Number: No data recorded  Contact Fax Number: No data recorded  Prescriber Name: No data recorded  Prescriber Address (if known): No data recorded What Is the Reason for Your Visit/Call Today? Patient presents via [employer] after being located from missing persons report placed by her boyfriend.  She reports she has "a lot" going on and needs to talk to someone.  How Long Has This Been Causing You Problems? > than 6 months  Have You Recently Been in Any Inpatient Treatment (Hospital/Detox/Crisis Center/28-Day Program)? No   Name/Location of Program/Hospital:No data recorded  How Long Were You There? No data recorded  When Were You Discharged? No data recorded Have You Ever Received Services From [facility] Before? Yes   Who Do You See at [facility]?  Urgent Care and ED visits  Have You Recently Had Any Thoughts About Hurting Yourself? No   Are You Planning to Commit Suicide/Harm Yourself At This time?  No  Have you Recently Had Thoughts About Hurting Someone Karolee Ohs? No   Explanation: No data recorded Have You Used Any Alcohol or Drugs in the Past 24 Hours? No   How Long Ago Did You Use Drugs or Alcohol?  No data recorded  What Did You Use and How Much? No data recorded What Do You Feel Would Help You the Most Today? Therapy  Do You Currently Have a Therapist/Psychiatrist? No   Name of  Therapist/Psychiatrist: No data recorded  Have You Been Recently Discharged From Any Office Practice or Programs? No   Explanation of Discharge From Practice/Program:  No data recorded    CCA Screening Triage Referral Assessment Type of Contact: Face-to-Face   Is this Initial or Reassessment? No data recorded  Date Telepsych consult ordered in [facility]:  No data recorded  Time Telepsych consult ordered in [facility]:  No data recorded Patient Reported Information Reviewed? Yes   Patient Left Without Being Seen? No data recorded  Reason for Not Completing Assessment: No data recorded Collateral Involvement: N/A  Does Patient Have a Court Appointed Legal Guardian? No data recorded  Name and Contact of Legal Guardian:  No data recorded If Minor and Not Living with Parent(s), Who has Custody? No data recorded Is CPS involved or ever been involved? Never  Is APS involved or ever been involved? Never  Patient Determined To Be At Risk for Harm To Self or Others Based on Review of Patient Reported Information or Presenting Complaint? No   Method: No data recorded  Availability of Means: No data recorded  Intent: No data recorded  Notification Required: No data recorded  Additional Information for Danger to Others Potential:  No data recorded  Additional Comments for Danger to Others Potential:  No data recorded  Are There Guns or Other Weapons in Your Home?  No data recorded   Types of Guns/Weapons: No data recorded   Are These Weapons Safely Secured?                              No data recorded   Who Could Verify You Are Able To Have These Secured:    No data recorded Do You Have any Outstanding Charges, Pending Court Dates, Parole/Probation? No data recorded Contacted To Inform of Risk of Harm To Self or Others: No data recorded Location of Assessment: [facility]  Does Patient Present under Involuntary Commitment? No   IVC Papers Initial File Date: No data  recorded  [place] of Residence: [place]  Patient Currently Receiving the Following Services: No data recorded  Determination of Need: No data recorded  Options For Referral: No data recorded  Yetta Glassman, [facility]

## 2020-04-03 ENCOUNTER — Ambulatory Visit (HOSPITAL_COMMUNITY)
Admission: EM | Admit: 2020-04-03 | Discharge: 2020-04-03 | Disposition: A | Discharge status: Home / Self Care | Payer: Medicaid Other | Attending: Family Medicine | Admitting: Family Medicine

## 2020-04-03 ENCOUNTER — Encounter (HOSPITAL_COMMUNITY): Payer: Self-pay

## 2020-04-03 ENCOUNTER — Other Ambulatory Visit: Payer: Self-pay

## 2020-04-03 DIAGNOSIS — Z3201 Encounter for pregnancy test, result positive: Secondary | ICD-10-CM | POA: Insufficient documentation

## 2020-04-03 DIAGNOSIS — Z113 Encounter for screening for infections with a predominantly sexual mode of transmission: Secondary | ICD-10-CM | POA: Insufficient documentation

## 2020-04-03 LAB — POC URINE PREG, ED: Preg Test, Ur: POSITIVE — AB

## 2020-04-03 NOTE — ED Triage Notes (Signed)
Pt present possible pregnancy. Pt states he took two pregnancy at home and the lines was faint and she would like to get a recheck.  Pt would like to get retested for an std. She came in last month and tested positive for an STD but she would like to get recheck to make sure its clear. Pt denies any symptom.

## 2020-04-04 ENCOUNTER — Inpatient Hospital Stay (HOSPITAL_COMMUNITY): Payer: Medicaid Other

## 2020-04-04 ENCOUNTER — Other Ambulatory Visit: Payer: Self-pay

## 2020-04-04 ENCOUNTER — Inpatient Hospital Stay (HOSPITAL_COMMUNITY)
Admission: AD | Admit: 2020-04-04 | Discharge: 2020-04-04 | Disposition: A | Discharge status: Home / Self Care | Payer: Medicaid Other | Attending: Obstetrics and Gynecology | Admitting: Obstetrics and Gynecology

## 2020-04-04 ENCOUNTER — Encounter (HOSPITAL_COMMUNITY): Payer: Self-pay | Admitting: *Deleted

## 2020-04-04 DIAGNOSIS — O26891 Other specified pregnancy related conditions, first trimester: Secondary | ICD-10-CM | POA: Diagnosis not present

## 2020-04-04 DIAGNOSIS — O26899 Other specified pregnancy related conditions, unspecified trimester: Secondary | ICD-10-CM

## 2020-04-04 DIAGNOSIS — Z3A01 Less than 8 weeks gestation of pregnancy: Secondary | ICD-10-CM

## 2020-04-04 DIAGNOSIS — M549 Dorsalgia, unspecified: Secondary | ICD-10-CM | POA: Diagnosis not present

## 2020-04-04 DIAGNOSIS — R109 Unspecified abdominal pain: Secondary | ICD-10-CM | POA: Diagnosis not present

## 2020-04-04 DIAGNOSIS — Z3201 Encounter for pregnancy test, result positive: Secondary | ICD-10-CM | POA: Diagnosis not present

## 2020-04-04 LAB — URINALYSIS, ROUTINE W REFLEX MICROSCOPIC
Bilirubin Urine: NEGATIVE
Glucose, UA: NEGATIVE mg/dL
Hgb urine dipstick: NEGATIVE
Ketones, ur: NEGATIVE mg/dL
Leukocytes,Ua: NEGATIVE
Nitrite: NEGATIVE
Protein, ur: NEGATIVE mg/dL
Specific Gravity, Urine: 1.02 (ref 1.005–1.030)
pH: 7 (ref 5.0–8.0)

## 2020-04-04 LAB — CERVICOVAGINAL ANCILLARY ONLY
Bacterial Vaginitis (gardnerella): POSITIVE — AB
Candida Glabrata: NEGATIVE
Candida Vaginitis: NEGATIVE
Chlamydia: NEGATIVE
Comment: NEGATIVE
Comment: NEGATIVE
Comment: NEGATIVE
Comment: NEGATIVE
Comment: NEGATIVE
Comment: NORMAL
Neisseria Gonorrhea: NEGATIVE
Trichomonas: NEGATIVE

## 2020-04-04 LAB — HCG, QUANTITATIVE, PREGNANCY: hCG, Beta Chain, Quant, S: 15 m[IU]/mL — ABNORMAL HIGH (ref <5)

## 2020-04-04 LAB — COMPREHENSIVE METABOLIC PANEL
ALT: 18 U/L (ref 0–44)
AST: 18 U/L (ref 15–41)
Albumin: 3.9 g/dL (ref 3.5–5.0)
Alkaline Phosphatase: 74 U/L (ref 38–126)
Anion gap: 9 (ref 5–15)
BUN: 8 mg/dL (ref 6–20)
CO2: 22 mmol/L (ref 22–32)
Calcium: 9.2 mg/dL (ref 8.9–10.3)
Chloride: 105 mmol/L (ref 98–111)
Creatinine, Ser: 0.75 mg/dL (ref 0.44–1.00)
GFR, Estimated: >60 mL/min (ref >60)
Glucose, Bld: 94 mg/dL (ref 70–99)
Potassium: 4 mmol/L (ref 3.5–5.1)
Sodium: 136 mmol/L (ref 135–145)
Total Bilirubin: 0.6 mg/dL (ref 0.3–1.2)
Total Protein: 7 g/dL (ref 6.5–8.1)

## 2020-04-04 LAB — CBC
HCT: 39.5 % (ref 36.0–46.0)
Hemoglobin: 13.3 g/dL (ref 12.0–15.0)
MCH: 30.6 pg (ref 26.0–34.0)
MCHC: 33.7 g/dL (ref 30.0–36.0)
MCV: 91 fL (ref 80.0–100.0)
Platelets: 366 10*3/uL (ref 150–400)
RBC: 4.34 MIL/uL (ref 3.87–5.11)
RDW: 11.7 % (ref 11.5–15.5)
WBC: 7 10*3/uL (ref 4.0–10.5)
nRBC: 0 % (ref 0.0–0.2)

## 2020-04-04 NOTE — MAU Note (Signed)
Presents with c/o abdominal and back pain.  Reports abdominal pain is across lower abdomen and back pain is right lower.  Denies VB.  LMP 03/07/2020.  +UPT yesterday @ Urgent Care.

## 2020-04-04 NOTE — ED Provider Notes (Signed)
[facility]   [id number] 04/03/20 Arrival Time: 1704  ASSESSMENT & PLAN:  1. Positive pregnancy test   2. Screening for STDs (sexually transmitted diseases)     Vaginal cytology pending. No symptoms. No empiric tx. UPT positive.  Recommend:   Follow-up Information    Schedule an appointment as soon as possible for a visit  with [facility].   Specialty: Obstetrics and Gynecology Contact information: [address] [place] [address] [phone]              Reviewed expectations re: course of current medical issues. Questions answered. Outlined signs and symptoms indicating need for more acute intervention. Understanding verbalized. After Visit Summary given.   SUBJECTIVE: History from: patient. Bethany Avery is a 19 y.o. female who requests preg test after + at home x 2. Also requests STD testing. No vaginal d/c. Normal urination.  Normal PO intake without n/v/d.    OBJECTIVE:  Vitals:   04/03/20 1735  BP: 119/71  Pulse: 98  Resp: 18  Temp: 98.9 F (37.2 C)  TempSrc: Oral  SpO2: 100%    General appearance: alert; no distress Eyes: PERRLA; EOMI; conjunctiva normal HENT: [place]; AT Neck: supple  Lungs: speaks full sentences without difficulty; unlabored GU: deferred Extremities: no edema Skin: warm and dry Neurologic: normal gait Psychological: alert and cooperative; normal mood and affect  Labs:  Labs Reviewed  POC URINE PREG, ED - Abnormal; Notable for the following components:      Result Value   Preg Test, Ur POSITIVE (*)    All other components within normal limits  CERVICOVAGINAL ANCILLARY ONLY      Allergies  Allergen Reactions  . Nickel Rash    Past Medical History:  Diagnosis Date  . ADHD (attention deficit hyperactivity disorder)   . Gonorrhea 02/02/2018   Social History   Socioeconomic History  . Marital status: Significant  Other    Spouse name: Not on file  . Number of children: Not on file  . Years of education: Not on file  . Highest education level: Not on file  Occupational History  . Not on file  Tobacco Use  . Smoking status: Never Smoker  . Smokeless tobacco: Never Used  Vaping Use  . Vaping Use: Never used  Substance and Sexual Activity  . Alcohol use: No  . Drug use: Not Currently    Types: Marijuana    Comment: LAST SMOKED ON  MONDAY  . Sexual activity: Yes    Birth control/protection: None  Other Topics Concern  . Not on file  Social History Narrative  . Not on file   Social Determinants of Health   Financial Resource Strain:   . Difficulty of Paying Living Expenses: Not on file  Food Insecurity:   . Worried About Programme researcher, broadcasting/film/video in the Last Year: Not on file  . Ran Out of Food in the Last Year: Not on file  Transportation Needs:   . Lack of Transportation (Medical): Not on file  . Lack of Transportation (Non-Medical): Not on file  Physical Activity:   . Days of Exercise per Week: Not on file  . Minutes of Exercise per Session: Not on file  Stress:   . Feeling of Stress : Not on file  Social Connections:   . Frequency of Communication with Friends and Family: Not on file  . Frequency of Social Gatherings with Friends and Family: Not on  file  . Attends Religious Services: Not on file  . Active Member of Clubs or Organizations: Not on file  . Attends Banker Meetings: Not on file  . Marital Status: Not on file  Intimate Partner Violence:   . Fear of Current or Ex-Partner: Not on file  . Emotionally Abused: Not on file  . Physically Abused: Not on file  . Sexually Abused: Not on file   Family History  Problem Relation Age of Onset  . Healthy Mother   . Healthy Father    History reviewed. No pertinent surgical history.   Mardella Layman, MD 04/04/20 [phone]

## 2020-04-04 NOTE — Discharge Instructions (Signed)
Ectopic Pregnancy  An ectopic pregnancy is when the fertilized egg attaches (implants) outside the uterus. Most ectopic pregnancies occur in one of the tubes where eggs travel from the ovary to the uterus (fallopian tubes), but the implanting can occur in other locations. In rare cases, ectopic pregnancies occur on the ovary, intestine, pelvis, abdomen, or cervix. In an ectopic pregnancy, the fertilized egg does not have the ability to develop into a normal, healthy baby. A ruptured ectopic pregnancy is one in which tearing or bursting of a fallopian tube causes internal bleeding. Often, there is intense lower abdominal pain, and vaginal bleeding sometimes occurs. Having an ectopic pregnancy can be life-threatening. If this dangerous condition is not treated, it can lead to blood loss, shock, or even death. What are the causes? The most common cause of this condition is damage to one of the fallopian tubes. A fallopian tube may be narrowed or blocked, and that keeps the fertilized egg from reaching the uterus. What increases the risk? This condition is more likely to develop in women of childbearing age who have different levels of risk. The levels of risk can be divided into three categories. High risk  You have gone through infertility treatment.  You have had an ectopic pregnancy before.  You have had surgery on the fallopian tubes, or another surgical procedure, such as an abortion.  You have had surgery to have the fallopian tubes tied (tubal ligation).  You have problems or diseases of the fallopian tubes.  You have been exposed to diethylstilbestrol (DES). This medicine was used until 1971, and it had effects on babies whose mothers took the medicine.  You become pregnant while using an IUD (intrauterine device) for birth control. Moderate risk  You have a history of infertility.  You have had an STI (sexually transmitted infection).  You have a history of pelvic inflammatory  disease (PID).  You have scarring from endometriosis.  You have multiple sexual partners.  You smoke. Low risk  You have had pelvic surgery.  You use vaginal douches.  You became sexually active before age 18. What are the signs or symptoms? Common symptoms of this condition include normal pregnancy symptoms, such as missing a period, nausea, tiredness, abdominal pain, breast tenderness, and bleeding. However, ectopic pregnancy will have additional symptoms, such as:  Pain with intercourse.  Irregular vaginal bleeding or spotting.  Cramping or pain on one side or in the lower abdomen.  Fast heartbeat, low blood pressure, and sweating.  Passing out while having a bowel movement. Symptoms of a ruptured ectopic pregnancy and internal bleeding may include:  Sudden, severe pain in the abdomen and pelvis.  Dizziness, weakness, light-headedness, or fainting.  Pain in the shoulder or neck area. How is this diagnosed? This condition is diagnosed by:  A pelvic exam to locate pain or a mass in the abdomen.  A pregnancy test. This blood test checks for the presence as well as the specific level of pregnancy hormone in the bloodstream.  Ultrasound. This is performed if a pregnancy test is positive. In this test, a probe is inserted into the vagina. The probe will detect a fetus, possibly in a location other than the uterus.  Taking a sample of uterus tissue (dilation and curettage, or D&C).  Surgery to perform a visual exam of the inside of the abdomen using a thin, lighted tube that has a tiny camera on the end (laparoscope).  Culdocentesis. This procedure involves inserting a needle at the top of   the vagina, behind the uterus. If blood is present in this area, it may indicate that a fallopian tube is torn. How is this treated? This condition is treated with medicine or surgery. Medicine  An injection of a medicine (methotrexate) may be given to cause the pregnancy tissue to be  absorbed. This medicine may save your fallopian tube. It may be given if: ? The diagnosis is made early, with no signs of active bleeding. ? The fallopian tube has not ruptured. ? You are considered to be a good candidate for the medicine. Usually, pregnancy hormone blood levels are checked after methotrexate treatment. This is to be sure that the medicine is effective. It may take 4-6 weeks for the pregnancy to be absorbed. Most pregnancies will be absorbed by 3 weeks. Surgery  A laparoscope may be used to remove the pregnancy tissue.  If severe internal bleeding occurs, a larger cut (incision) may be made in the lower abdomen (laparotomy) to remove the fetus and placenta. This is done to stop the bleeding.  Part or all of the fallopian tube may be removed (salpingectomy) along with the fetus and placenta. The fallopian tube may also be repaired during the surgery.  In very rare circumstances, removal of the uterus (hysterectomy) may be required.  After surgery, pregnancy hormone testing may be done to be sure that there is no pregnancy tissue left. Whether your treatment is medicine or surgery, you may receive a Rho (D) immune globulin shot to prevent problems with any future pregnancy. This shot may be given if:  You are Rh-negative and the baby's father is Rh-positive.  You are Rh-negative and you do not know the Rh type of the baby's father. Follow these instructions at home:  Rest and limit your activity after the procedure for as long as told by your health care provider.  Until your health care provider says that it is safe: ? Do not lift anything that is heavier than 10 lb (4.5 kg), or the limit that your health care provider tells you. ? Avoid physical exercise and any movement that requires effort (is strenuous).  To help prevent constipation: ? Eat a healthy diet that includes fruits, vegetables, and whole grains. ? Drink 6-8 glasses of water per day. Get help right away  if:  You develop worsening pain that is not relieved by medicine.  You have: ? A fever or chills. ? Vaginal bleeding. ? Redness and swelling at the incision site. ? Nausea and vomiting.  You feel dizzy or weak.  You feel light-headed or you faint. This information is not intended to replace advice given to you by your health care provider. Make sure you discuss any questions you have with your health care provider. Document Revised: 04/23/2017 Document Reviewed: 12/11/2015 Elsevier Patient Education  2020 [employer]        First Trimester of Pregnancy The first trimester of pregnancy is from week 1 until the end of week 13 (months 1 through 3). A week after a sperm fertilizes an egg, the egg will implant on the wall of the uterus. This embryo will begin to develop into a baby. Genes from you and your partner will form the baby. The female genes will determine whether the baby will be a boy or a girl. At 6-8 weeks, the eyes and face will be formed, and the heartbeat can be seen on ultrasound. At the end of 12 weeks, all the baby's organs will be formed. Now that you are   pregnant, you will want to do everything you can to have a healthy baby. Two of the most important things are to get good prenatal care and to follow your health care provider's instructions. Prenatal care is all the medical care you receive before the baby's birth. This care will help prevent, find, and treat any problems during the pregnancy and childbirth. Body changes during your first trimester Your body goes through many changes during pregnancy. The changes vary from woman to woman.  You may gain or lose a couple of pounds at first.  You may feel sick to your stomach (nauseous) and you may throw up (vomit). If the vomiting is uncontrollable, call your health care provider.  You may tire easily.  You may develop headaches that can be relieved by medicines. All medicines should be approved by your health care  provider.  You may urinate more often. Painful urination may mean you have a bladder infection.  You may develop heartburn as a result of your pregnancy.  You may develop constipation because certain hormones are causing the muscles that push stool through your intestines to slow down.  You may develop hemorrhoids or swollen veins (varicose veins).  Your breasts may begin to grow larger and become tender. Your nipples may stick out more, and the tissue that surrounds them (areola) may become darker.  Your gums may bleed and may be sensitive to brushing and flossing.  Dark spots or blotches (chloasma, mask of pregnancy) may develop on your face. This will likely fade after the baby is born.  Your menstrual periods will stop.  You may have a loss of appetite.  You may develop cravings for certain kinds of food.  You may have changes in your emotions from day to day, such as being excited to be pregnant or being concerned that something may go wrong with the pregnancy and baby.  You may have more vivid and strange dreams.  You may have changes in your hair. These can include thickening of your hair, rapid growth, and changes in texture. Some women also have hair loss during or after pregnancy, or hair that feels dry or thin. Your hair will most likely return to normal after your baby is born. What to expect at prenatal visits During a routine prenatal visit:  You will be weighed to make sure you and the baby are growing normally.  Your blood pressure will be taken.  Your abdomen will be measured to track your baby's growth.  The fetal heartbeat will be listened to between weeks 10 and 14 of your pregnancy.  Test results from any previous visits will be discussed. Your health care provider may ask you:  How you are feeling.  If you are feeling the baby move.  If you have had any abnormal symptoms, such as leaking fluid, bleeding, severe headaches, or abdominal cramping.  If  you are using any tobacco products, including cigarettes, chewing tobacco, and electronic cigarettes.  If you have any questions. Other tests that may be performed during your first trimester include:  Blood tests to find your blood type and to check for the presence of any previous infections. The tests will also be used to check for low iron levels (anemia) and protein on red blood cells (Rh antibodies). Depending on your risk factors, or if you previously had diabetes during pregnancy, you may have tests to check for high blood sugar that affects pregnant women (gestational diabetes).  Urine tests to check for infections, diabetes,   or protein in the urine.  An ultrasound to confirm the proper growth and development of the baby.  Fetal screens for spinal cord problems (spina bifida) and Down syndrome.  HIV (human immunodeficiency virus) testing. Routine prenatal testing includes screening for HIV, unless you choose not to have this test.  You may need other tests to make sure you and the baby are doing well. Follow these instructions at home: Medicines  Follow your health care provider's instructions regarding medicine use. Specific medicines may be either safe or unsafe to take during pregnancy.  Take a prenatal vitamin that contains at least 600 micrograms (mcg) of folic acid.  If you develop constipation, try taking a stool softener if your health care provider approves. Eating and drinking   Eat a balanced diet that includes fresh fruits and vegetables, whole grains, good sources of protein such as meat, eggs, or tofu, and low-fat dairy. Your health care provider will help you determine the amount of weight gain that is right for you.  Avoid raw meat and uncooked cheese. These carry germs that can cause birth defects in the baby.  Eating four or five small meals rather than three large meals a day may help relieve nausea and vomiting. If you start to feel nauseous, eating a few  soda crackers can be helpful. Drinking liquids between meals, instead of during meals, also seems to help ease nausea and vomiting.  Limit foods that are high in fat and processed sugars, such as fried and sweet foods.  To prevent constipation: ? Eat foods that are high in fiber, such as fresh fruits and vegetables, whole grains, and beans. ? Drink enough fluid to keep your urine clear or pale yellow. Activity  Exercise only as directed by your health care provider. Most women can continue their usual exercise routine during pregnancy. Try to exercise for 30 minutes at least 5 days a week. Exercising will help you: ? Control your weight. ? Stay in shape. ? Be prepared for labor and delivery.  Experiencing pain or cramping in the lower abdomen or lower back is a good sign that you should stop exercising. Check with your health care provider before continuing with normal exercises.  Try to avoid standing for long periods of time. Move your legs often if you must stand in one place for a long time.  Avoid heavy lifting.  Wear low-heeled shoes and practice good posture.  You may continue to have sex unless your health care provider tells you not to. Relieving pain and discomfort  Wear a good support bra to relieve breast tenderness.  Take warm sitz baths to soothe any pain or discomfort caused by hemorrhoids. Use hemorrhoid cream if your health care provider approves.  Rest with your legs elevated if you have leg cramps or low back pain.  If you develop varicose veins in your legs, wear support hose. Elevate your feet for 15 minutes, 3-4 times a day. Limit salt in your diet. Prenatal care  Schedule your prenatal visits by the twelfth week of pregnancy. They are usually scheduled monthly at first, then more often in the last 2 months before delivery.  Write down your questions. Take them to your prenatal visits.  Keep all your prenatal visits as told by your health care provider.  This is important. Safety  Wear your seat belt at all times when driving.  Make a list of emergency phone numbers, including numbers for family, friends, the hospital, and police and fire departments. General   instructions  Ask your health care provider for a referral to a local prenatal education class. Begin classes no later than the beginning of month 6 of your pregnancy.  Ask for help if you have counseling or nutritional needs during pregnancy. Your health care provider can offer advice or refer you to specialists for help with various needs.  Do not use hot tubs, steam rooms, or saunas.  Do not douche or use tampons or scented sanitary pads.  Do not cross your legs for long periods of time.  Avoid cat litter boxes and soil used by cats. These carry germs that can cause birth defects in the baby and possibly loss of the fetus by miscarriage or stillbirth.  Avoid all smoking, herbs, alcohol, and medicines not prescribed by your health care provider. Chemicals in these products affect the formation and growth of the baby.  Do not use any products that contain nicotine or tobacco, such as cigarettes and e-cigarettes. If you need help quitting, ask your health care provider. You may receive counseling support and other resources to help you quit.  Schedule a dentist appointment. At home, brush your teeth with a soft toothbrush and be gentle when you floss. Contact a health care provider if:  You have dizziness.  You have mild pelvic cramps, pelvic pressure, or nagging pain in the abdominal area.  You have persistent nausea, vomiting, or diarrhea.  You have a bad smelling vaginal discharge.  You have pain when you urinate.  You notice increased swelling in your face, hands, legs, or ankles.  You are exposed to fifth disease or chickenpox.  You are exposed to German measles (rubella) and have never had it. Get help right away if:  You have a fever.  You are leaking fluid  from your vagina.  You have spotting or bleeding from your vagina.  You have severe abdominal cramping or pain.  You have rapid weight gain or loss.  You vomit blood or material that looks like coffee grounds.  You develop a severe headache.  You have shortness of breath.  You have any kind of trauma, such as from a fall or a car accident. Summary  The first trimester of pregnancy is from week 1 until the end of week 13 (months 1 through 3).  Your body goes through many changes during pregnancy. The changes vary from woman to woman.  You will have routine prenatal visits. During those visits, your health care provider will examine you, discuss any test results you may have, and talk with you about how you are feeling. This information is not intended to replace advice given to you by your health care provider. Make sure you discuss any questions you have with your health care provider. Document Revised: 04/23/2017 Document Reviewed: 04/22/2016 Elsevier Patient Education  2020 [employer]        Miscarriage A miscarriage is the loss of an unborn baby (fetus) before the 20th week of pregnancy. Most miscarriages happen during the first 3 months of pregnancy. Sometimes, a miscarriage can happen before a woman knows that she is pregnant. Having a miscarriage can be an emotional experience. If you have had a miscarriage, talk with your health care provider about any questions you may have about miscarrying, the grieving process, and your plans for future pregnancy. What are the causes? A miscarriage may be caused by:  Problems with the genes or chromosomes of the fetus. These problems make it impossible for the baby to develop normally. They   are often the result of random errors that occur early in the development of the baby, and are not passed from parent to child (not inherited).  Infection of the cervix or uterus.  Conditions that affect hormone balance in the  body.  Problems with the cervix, such as the cervix opening and thinning before pregnancy is at term (cervical insufficiency).  Problems with the uterus. These may include: ? A uterus with an abnormal shape. ? Fibroids in the uterus. ? Congenital abnormalities. These are problems that were present at birth.  Certain medical conditions.  Smoking, drinking alcohol, or using drugs.  Injury (trauma). In many cases, the cause of a miscarriage is not known. What are the signs or symptoms? Symptoms of this condition include:  Vaginal bleeding or spotting, with or without cramps or pain.  Pain or cramping in the abdomen or lower back.  Passing fluid, tissue, or blood clots from the vagina. How is this diagnosed? This condition may be diagnosed based on:  A physical exam.  Ultrasound.  Blood tests.  Urine tests. How is this treated? Treatment for a miscarriage is sometimes not necessary if you naturally pass all the tissue that was in your uterus. If necessary, this condition may be treated with:  Dilation and curettage (D&C). This is a procedure in which the cervix is stretched open and the lining of the uterus (endometrium) is scraped. This is done only if tissue from the fetus or placenta remains in the body (incomplete miscarriage).  Medicines, such as: ? Antibiotic medicine, to treat infection. ? Medicine to help the body pass any remaining tissue. ? Medicine to reduce (contract) the size of the uterus. These medicines may be given if you have a lot of bleeding. If you have Rh negative blood and your baby was Rh positive, you will need a shot of a medicine called Rh immunoglobulinto protect your future babies from Rh blood problems. "Rh-negative" and "Rh-positive" refer to whether or not the blood has a specific protein found on the surface of red blood cells (Rh factor). Follow these instructions at home: Medicines   Take over-the-counter and prescription medicines only as  told by your health care provider.  If you were prescribed antibiotic medicine, take it as told by your health care provider. Do not stop taking the antibiotic even if you start to feel better.  Do not take NSAIDs, such as aspirin and ibuprofen, unless they are approved by your health care provider. These medicines can cause bleeding. Activity  Rest as directed. Ask your health care provider what activities are safe for you.  Have someone help with home and family responsibilities during this time. General instructions  Keep track of the number of sanitary pads you use each day and how soaked (saturated) they are. Write down this information.  Monitor the amount of tissue or blood clots that you pass from your vagina. Save any large amounts of tissue for your health care provider to examine.  Do not use tampons, douche, or have sex until your health care provider approves.  To help you and your partner with the process of grieving, talk with your health care provider or seek counseling.  When you are ready, meet with your health care provider to discuss any important steps you should take for your health. Also, discuss steps you should take to have a healthy pregnancy in the future.  Keep all follow-up visits as told by your health care provider. This is important. Where to find   more information  The [employer]nec[employer]: www.acog.org  [place] Department of Health and Human Services Office of Women's Health: www.womenshealth.gov Contact a health care provider if:  You have a fever or chills.  You have a foul smelling vaginal discharge.  You have more bleeding instead of less. Get help right away if:  You have severe cramps or pain in your back or abdomen.  You pass blood clots or tissue from your vagina that is walnut-sized or larger.  You soak more than 1 regular sanitary pad in an hour.  You become light-headed or weak.  You pass out.  You  have feelings of sadness that take over your thoughts, or you have thoughts of hurting yourself. Summary  Most miscarriages happen in the first 3 months of pregnancy. Sometimes miscarriage happens before a woman even knows that she is pregnant.  Follow your health care provider's instruction for home care. Keep all follow-up appointments.  To help you and your partner with the process of grieving, talk with your health care provider or seek counseling. This information is not intended to replace advice given to you by your health care provider. Make sure you discuss any questions you have with your health care provider. Document Revised: 09/02/2018 Document Reviewed: 06/16/2016 Elsevier Patient Education  2020 [employer]                        Safe Medications in Pregnancy    Acne: Benzoyl Peroxide Salicylic Acid  Backache/Headache: Tylenol: 2 regular strength every 4 hours OR              2 Extra strength every 6 hours  Colds/Coughs/Allergies: Benadryl (alcohol free) 25 mg every 6 hours as needed Breath right strips Claritin Cepacol throat lozenges Chloraseptic throat spray Cold-Eeze- up to three times per day Cough drops, alcohol free Flonase (by prescription only) Guaifenesin Mucinex Robitussin DM (plain only, alcohol free) Saline nasal spray/drops Sudafed (pseudoephedrine) & Actifed ** use only after [redacted] weeks gestation and if you do not have high blood pressure Tylenol Vicks Vaporub Zinc lozenges Zyrtec   Constipation: Colace Ducolax suppositories Fleet enema Glycerin suppositories Metamucil Milk of magnesia Miralax Senokot Smooth move tea  Diarrhea: Kaopectate Imodium A-D  *NO pepto Bismol  Hemorrhoids: Anusol Anusol HC Preparation H Tucks  Indigestion: Tums Maalox Mylanta Zantac  Pepcid  Insomnia: Benadryl (alcohol free) 25mg every 6 hours as needed Tylenol PM Unisom, no Gelcaps  Leg Cramps: Tums MagGel  Nausea/Vomiting:   Bonine Dramamine Emetrol Ginger extract Sea bands Meclizine  Nausea medication to take during pregnancy:  Unisom (doxylamine succinate 25 mg tablets) Take one tablet daily at bedtime. If symptoms are not adequately controlled, the dose can be increased to a maximum recommended dose of two tablets daily (1/2 tablet in the morning, 1/2 tablet mid-afternoon and one at bedtime). Vitamin B6 100mg tablets. Take one tablet twice a day (up to 200 mg per day).  Skin Rashes: Aveeno products Benadryl cream or 25mg every 6 hours as needed Calamine Lotion 1% cortisone cream  Yeast infection: Gyne-lotrimin 7 Monistat 7   **If taking multiple medications, please check labels to avoid duplicating the same active ingredients **take medication as directed on the label ** Do not exceed 4000 mg of tylenol in 24 hours **Do not take medications that contain aspirin or ibuprofen          Prenatal Care Providers             [facility] @ [facility] - accepts patients without insurance  Phone: [phone]  [facility]   Phone: [phone]  [facility]       Phone: [phone]            [facility] [place]     Phone: [phone]          [facility] [place]   Phone: [phone]  [facility] - accepts patients without insurance  Phone: [phone]  [facility] [place] Phone: [phone]     [facility] - accepts patients without insurance Phone: [phone]  [facility]  Phone: [phone]  [facility] Phone: [phone]  [facility] Phone: [phone]  [facility] Phone: [phone]  [facility] Phone: [phone]  [facility]  Phone: [phone]   

## 2020-04-04 NOTE — MAU Provider Note (Signed)
History     CSN: [id number]  Arrival date and time: 04/04/20 6546   First Provider Initiated Contact with Patient 04/04/20 1148      Chief Complaint  Patient presents with  . Abdominal Pain  . Back Pain   [name] is a 19 y.o. G2P0010 at [redacted]w[redacted]d who presents to MAU for lower abdominal cramping x4 days that is intermittent. Patient reports the pain has stayed the same since that time and nothing makes it better or worse. Patient has not tried anything for the pain, but reports that it is not currently present and the last time she felt it was this morning, briefly. Patient went to Urgent Care yesterday to confirm pregnancy.  Pt denies VB, vaginal discharge/odor/itching. Pt denies N/V, constipation, diarrhea, or urinary problems. Pt denies fever, chills, fatigue, sweating or changes in appetite. Pt denies SOB or chest pain. Pt denies dizziness, HA, light-headedness, weakness.   OB History    Gravida  2   Para      Term      Preterm      AB  1   Living        SAB  1   TAB      Ectopic      Multiple      Live Births              Past Medical History:  Diagnosis Date  . ADHD (attention deficit hyperactivity disorder)   . Gonorrhea 02/02/2018    History reviewed. No pertinent surgical history.  Family History  Problem Relation Age of Onset  . Healthy Mother   . Healthy Father     Social History   Tobacco Use  . Smoking status: Never Smoker  . Smokeless tobacco: Never Used  Vaping Use  . Vaping Use: Never used  Substance Use Topics  . Alcohol use: Not Currently  . Drug use: Yes    Types: Marijuana    Comment: Last used 03/03/20    Allergies:  Allergies  Allergen Reactions  . Nickel Rash    Medications Prior to Admission  Medication Sig Dispense Refill Last Dose  . albuterol (VENTOLIN HFA) 108 (90 Base) MCG/ACT inhaler Inhale 1-2 puffs into the lungs every 6 (six) hours as needed for wheezing or shortness of breath. 18 g  0 More than a month at Unknown time  . fluticasone (FLONASE) 50 MCG/ACT nasal spray Place 1-2 sprays into both nostrils daily for 7 days. 1 g 0   . metroNIDAZOLE (FLAGYL) 500 MG tablet Take 1 tablet (500 mg total) by mouth 2 (two) times daily. 14 tablet 0     Review of Systems  Constitutional: Negative for chills, diaphoresis, fatigue and fever.  Eyes: Negative for visual disturbance.  Respiratory: Negative for shortness of breath.   Cardiovascular: Negative for chest pain.  Gastrointestinal: Negative for abdominal pain, constipation, diarrhea, nausea and vomiting.  Genitourinary: Negative for dysuria, flank pain, frequency, pelvic pain (not currently present), urgency, vaginal bleeding and vaginal discharge.  Neurological: Negative for dizziness, weakness, light-headedness and headaches.   Physical Exam   Blood pressure 120/70, pulse 69, temperature 98.7 F (37.1 C), temperature source Oral, resp. rate 20, height 5\' 5"  (1.651 m), weight 94.3 kg, last menstrual period 03/07/2020, SpO2 96 %.  Patient Vitals for the past 24 hrs:  BP Temp Temp src Pulse Resp SpO2 Height Weight  04/04/20 1011 120/70 98.7 F (37.1 C) Oral 69 20 96 % -- --  04/04/20  1005 -- -- -- -- -- -- 5\' 5"  (1.651 m) 94.3 kg   Physical Exam Vitals and nursing note reviewed.  Constitutional:      General: She is not in acute distress.    Appearance: Normal appearance. She is well-developed. She is not ill-appearing, toxic-appearing or diaphoretic.  HENT:     Head: Normocephalic and atraumatic.  Pulmonary:     Effort: Pulmonary effort is normal.  Abdominal:     General: There is no distension.     Palpations: Abdomen is soft. There is no mass.     Tenderness: There is no abdominal tenderness. There is no guarding or rebound.  Skin:    General: Skin is warm and dry.  Neurological:     Mental Status: She is alert and oriented to person, place, and time.  Psychiatric:        Mood and Affect: Mood normal.         Behavior: Behavior normal.        Thought Content: Thought content normal.        Judgment: Judgment normal.    Results for orders placed or performed during the hospital encounter of 04/04/20 (from the past 24 hour(s))  CBC     Status: None   Collection Time: 04/04/20 10:16 AM  Result Value Ref Range   WBC 7.0 4.0 - 10.5 K/uL   RBC 4.34 3.87 - 5.11 MIL/uL   Hemoglobin 13.3 12.0 - 15.0 g/dL   HCT 16.139.5 36 - 46 %   MCV 91.0 80.0 - 100.0 fL   MCH 30.6 26.0 - 34.0 pg   MCHC 33.7 30.0 - 36.0 g/dL   RDW 09.611.7 04.511.5 - 40.915.5 %   Platelets 366 150 - 400 K/uL   nRBC 0.0 0.0 - 0.2 %  Comprehensive metabolic panel     Status: None   Collection Time: 04/04/20 10:16 AM  Result Value Ref Range   Sodium 136 135 - 145 mmol/L   Potassium 4.0 3.5 - 5.1 mmol/L   Chloride 105 98 - 111 mmol/L   CO2 22 22 - 32 mmol/L   Glucose, Bld 94 70 - 99 mg/dL   BUN 8 6 - 20 mg/dL   Creatinine, Ser 8.110.75 0.44 - 1.00 mg/dL   Calcium 9.2 8.9 - 91.410.3 mg/dL   Total Protein 7.0 6.5 - 8.1 g/dL   Albumin 3.9 3.5 - 5.0 g/dL   AST 18 15 - 41 U/L   ALT 18 0 - 44 U/L   Alkaline Phosphatase 74 38 - 126 U/L   Total Bilirubin 0.6 0.3 - 1.2 mg/dL   GFR, Estimated >78>60 >29>60 mL/min   Anion gap 9 5 - 15  hCG, quantitative, pregnancy     Status: Abnormal   Collection Time: 04/04/20 10:16 AM  Result Value Ref Range   hCG, Beta Chain, Quant, S 15 (H) <5 mIU/mL  Urinalysis, Routine w reflex microscopic Urine, Clean Catch     Status: None   Collection Time: 04/04/20 10:59 AM  Result Value Ref Range   Color, Urine YELLOW YELLOW   APPearance CLEAR CLEAR   Specific Gravity, Urine 1.020 1.005 - 1.030   pH 7.0 5.0 - 8.0   Glucose, UA NEGATIVE NEGATIVE mg/dL   Hgb urine dipstick NEGATIVE NEGATIVE   Bilirubin Urine NEGATIVE NEGATIVE   Ketones, ur NEGATIVE NEGATIVE mg/dL   Protein, ur NEGATIVE NEGATIVE mg/dL   Nitrite NEGATIVE NEGATIVE   Leukocytes,Ua NEGATIVE NEGATIVE   No results found.  MAU  Course  Procedures  MDM -+UPT  without abdominal pain at this time -UA: WNL, urine sent for culture based on symptoms -CBC: WNL -CMP: WNL -[place]: not performed based on hCG 15 and absence of abdominal pain at this time -hCG: 15 -ABO: A Positive -WetPrep/GC/CT collected yesterday at Urgent Care, not recollected Discussed with client the diagnosis of pregnancy of unknown anatomic location.  Three possibilities of outcome are: a healthy pregnancy that is too early to see a yolk sac to confirm the pregnancy is in the uterus, a pregnancy that is not healthy and has not developed and will not develop, and an ectopic pregnancy that is in the abdomen that cannot be identified at this time.  And ectopic pregnancy can be a life threatening situation as a pregnancy needs to be in the uterus which is a muscle and can stretch to accommodate the growth of a pregnancy.  Other structures in the pelvis and abdomen as not muscular and do not stretch with the growth of a pregnancy.  Worst case scenario is that a structure ruptures with a growing pregnancy not in the uterus and and internal hemorrhage can be a life threatening situation.  We need to follow the progression of this pregnancy carefully.  We need to check another serum pregnancy hormone level to determine if the levels are rising appropriately  and to determine the next steps that are needed for you. Patient's questions were answered. -pt discharged to home in stable condition  Orders Placed This Encounter  Procedures  . Culture, OB Urine    Standing Status:   Standing    Number of Occurrences:   1  . CBC    Standing Status:   Standing    Number of Occurrences:   1  . Comprehensive metabolic panel    Standing Status:   Standing    Number of Occurrences:   1  . hCG, quantitative, pregnancy    Standing Status:   Standing    Number of Occurrences:   1  . Urinalysis, Routine w reflex microscopic Urine, Clean Catch    Standing Status:   Standing    Number of Occurrences:   1  .  Discharge patient    Order Specific Question:   Discharge disposition    Answer:   01-Home or Self Care [1]    Order Specific Question:   Discharge patient date    Answer:   04/04/2020   No orders of the defined types were placed in this encounter.   Assessment and Plan   1. Pregnancy test positive   2. Abdominal pain in pregnancy     Allergies as of 04/04/2020      Reactions   Nickel Rash      Medication List    TAKE these medications   albuterol 108 (90 Base) MCG/ACT inhaler Commonly known as: VENTOLIN HFA Inhale 1-2 puffs into the lungs every 6 (six) hours as needed for wheezing or shortness of breath.   fluticasone 50 MCG/ACT nasal spray Commonly known as: FLONASE Place 1-2 sprays into both nostrils daily for 7 days.   metroNIDAZOLE 500 MG tablet Commonly known as: FLAGYL Take 1 tablet (500 mg total) by mouth 2 (two) times daily.       -will call with culture results, if positive -safe meds in pregnancy list given -list of OB providers given -discussed ectopic vs. SAB vs. miscarriage -strict ectopic precautions given -return MAU precautions -f/u on 04/06/2020 at 1015AM at MAU for repeat  hCG -pt discharged to home in stable condition  Odie Sera Clark Cuff 04/04/2020, 12:27 PM

## 2020-04-04 NOTE — MAU Note (Signed)
Called lab about urine result and lab have misplaced urine NT sent urine after being collected and witnessed by RN

## 2020-04-05 ENCOUNTER — Telehealth (HOSPITAL_COMMUNITY): Payer: Self-pay | Admitting: Emergency Medicine

## 2020-04-05 LAB — CULTURE, OB URINE: Culture: NO GROWTH

## 2020-04-05 NOTE — Telephone Encounter (Signed)
Patient called to ask about prescription for positive BV.  When this RN reviewed, it appears prescription on medication list at d/c yesterday was not removed from last month.  Gave verbal to pharmacy, as I had called to check up and realized.  Patient updated.

## 2020-04-06 ENCOUNTER — Inpatient Hospital Stay (HOSPITAL_COMMUNITY)
Admission: AD | Admit: 2020-04-06 | Discharge: 2020-04-06 | Disposition: A | Discharge status: Home / Self Care | Payer: Medicaid Other | Attending: Obstetrics and Gynecology | Admitting: Obstetrics and Gynecology

## 2020-04-06 ENCOUNTER — Other Ambulatory Visit: Payer: Self-pay

## 2020-04-06 DIAGNOSIS — Z3A01 Less than 8 weeks gestation of pregnancy: Secondary | ICD-10-CM | POA: Insufficient documentation

## 2020-04-06 DIAGNOSIS — Z3689 Encounter for other specified antenatal screening: Secondary | ICD-10-CM | POA: Diagnosis not present

## 2020-04-06 DIAGNOSIS — E349 Endocrine disorder, unspecified: Secondary | ICD-10-CM

## 2020-04-06 DIAGNOSIS — O3680X0 Pregnancy with inconclusive fetal viability, not applicable or unspecified: Secondary | ICD-10-CM | POA: Insufficient documentation

## 2020-04-06 LAB — HCG, QUANTITATIVE, PREGNANCY: hCG, Beta Chain, Quant, S: 59 m[IU]/mL — ABNORMAL HIGH (ref <5)

## 2020-04-06 NOTE — MAU Note (Signed)
Bethany Avery is a 19 y.o. at [redacted]w[redacted]d here in MAU reporting: here for follow up hcg. Denies bleeding. States that she is having intermittent mild cramping.  Onset of complaint: ongoing  Pain score: 0/10  Vitals:   04/06/20 1031  BP: 123/73  Pulse: 83  Resp: 16  Temp: 98.4 F (36.9 C)  SpO2: 100%     Lab orders placed from triage: hcg

## 2020-04-06 NOTE — MAU Provider Note (Signed)
History   Chief Complaint:  Follow-up   Bethany Avery is  19 y.o. G2P0010 Patient's last menstrual period was 03/07/2020.Marland Kitchen Patient is here for follow up of quantitative HCG and ongoing surveillance of pregnancy status. She is [redacted]w[redacted]d weeks gestation  by LMP.    Since her last visit, the patient is without new complaint. The patient reports bleeding as  none now.  She denies any pain.  General ROS:  negative  Her previous Quantitative HCG values are:  Results for Bethany, Avery (MRN [id number]) as of 04/06/2020 12:02  Ref. Range 04/04/2020 10:16  HCG, Beta Chain, Quant, S Latest Ref Range: <5 mIU/mL 15 (H)   Physical Exam   Blood pressure 123/73, pulse 83, temperature 98.4 F (36.9 C), temperature source Oral, resp. rate 16, last menstrual period 03/07/2020, SpO2 100 %.  Focused Gynecological Exam: examination not indicated  Labs: Results for orders placed or performed during the hospital encounter of 04/06/20 (from the past 24 hour(s))  hCG, quantitative, pregnancy   Collection Time: 04/06/20 10:57 AM  Result Value Ref Range   hCG, Beta Chain, Quant, S 59 (H) <5 mIU/mL   Assessment:   1. Elevated serum hCG     -Appropriate rise in HCG. Discussed with patient and reviewed follow up plan. Patient agreeable to plan of care. Warning signs reviewed at length  Plan: -Discharge home in stable condition -Vaginal bleeding and pain precautions discussed -Patient advised to follow-up with [facility] in 2 weeks for repeat ultrasound, order placed -Patient may return to MAU as needed or if her condition were to change or worsen  Rolm Bookbinder, CNM 04/06/2020, 11:59 AM

## 2020-04-11 ENCOUNTER — Inpatient Hospital Stay (HOSPITAL_COMMUNITY)
Admission: AD | Admit: 2020-04-11 | Discharge: 2020-04-11 | Discharge status: Against Medical Advice | Payer: Medicaid Other | Attending: Obstetrics & Gynecology | Admitting: Obstetrics & Gynecology

## 2020-04-11 ENCOUNTER — Other Ambulatory Visit: Payer: Self-pay

## 2020-04-11 DIAGNOSIS — R102 Pelvic and perineal pain: Secondary | ICD-10-CM

## 2020-04-11 DIAGNOSIS — O26891 Other specified pregnancy related conditions, first trimester: Secondary | ICD-10-CM

## 2020-04-11 NOTE — Progress Notes (Signed)
Pt called for Triage and not in lobby 

## 2020-04-14 ENCOUNTER — Ambulatory Visit (HOSPITAL_COMMUNITY)
Admission: EM | Admit: 2020-04-14 | Discharge: 2020-04-14 | Discharge status: Against Medical Advice | Payer: Medicaid Other

## 2020-04-14 ENCOUNTER — Other Ambulatory Visit: Payer: Self-pay

## 2020-04-14 NOTE — ED Notes (Signed)
No answer when called in lobby and outside.

## 2020-04-14 NOTE — ED Notes (Signed)
Pt called in lobby again. No response

## 2020-04-14 NOTE — ED Notes (Signed)
No answer when called on phone

## 2020-04-14 NOTE — ED Notes (Signed)
Pt called in lobby. No response 

## 2020-04-17 ENCOUNTER — Inpatient Hospital Stay: Admission: RE | Admit: 2020-04-17 | Payer: Medicaid Other | Source: Ambulatory Visit

## 2020-04-17 ENCOUNTER — Other Ambulatory Visit: Payer: Self-pay

## 2020-04-17 ENCOUNTER — Inpatient Hospital Stay (HOSPITAL_COMMUNITY)
Admission: AD | Admit: 2020-04-17 | Discharge: 2020-04-17 | Disposition: A | Discharge status: Home / Self Care | Payer: Medicaid Other | Attending: Obstetrics and Gynecology | Admitting: Obstetrics and Gynecology

## 2020-04-17 DIAGNOSIS — Z3A01 Less than 8 weeks gestation of pregnancy: Secondary | ICD-10-CM | POA: Insufficient documentation

## 2020-04-17 DIAGNOSIS — Z3491 Encounter for supervision of normal pregnancy, unspecified, first trimester: Secondary | ICD-10-CM | POA: Diagnosis not present

## 2020-04-17 DIAGNOSIS — Z711 Person with feared health complaint in whom no diagnosis is made: Secondary | ICD-10-CM | POA: Diagnosis not present

## 2020-04-17 NOTE — MAU Note (Signed)
Patient states she is here for follow up ultrasound because she had scheduled in the office but the tech had called out sick so she was told to come here for her imaging.  Patient still endorses some abdominal cramping but denies any vaginal bleeding.

## 2020-04-17 NOTE — MAU Provider Note (Signed)
First Provider Initiated Contact with Patient 04/17/20 1132      S [name] is a 19 y.o. G2P0010 patient who presents to MAU today with complaint of none. Patient was scheduled to have an [place] today and was notified that the tech was sick and her [place] would need to be rescheduled. Patient reports she called the office and was told she could come to MAU instead, but also had her [place] appt rescheduled to 04/23/2020. Patient denies pain or bleeding at this time.  O BP 126/77   Pulse 76   Temp 98.5 F (36.9 C)   Resp 19   Wt 94.8 kg   LMP 03/07/2020   BMI 34.78 kg/m    Patient Vitals for the past 24 hrs:  BP Temp Pulse Resp Weight  04/17/20 1126 126/77 98.5 F (36.9 C) 76 19 --  04/17/20 1124 -- -- -- -- 94.8 kg   Physical Exam Vitals and nursing note reviewed.  Constitutional:      General: She is not in acute distress.    Appearance: Normal appearance. She is not ill-appearing, toxic-appearing or diaphoretic.  HENT:     Head: Normocephalic and atraumatic.  Pulmonary:     Effort: Pulmonary effort is normal.  Abdominal:     General: There is no distension.     Palpations: Abdomen is soft. There is no mass.     Tenderness: There is no abdominal tenderness. There is no guarding or rebound.  Skin:    General: Skin is warm and dry.  Neurological:     Mental Status: She is alert and oriented to person, place, and time.  Psychiatric:        Mood and Affect: Mood normal.        Behavior: Behavior normal.        Thought Content: Thought content normal.        Judgment: Judgment normal.    A Medical screening exam complete  P Discharge from MAU in stable condition Informed MAU does not do routine Korea Pt to keep Korea appt as scheduled Warning signs for worsening condition that would warrant emergency follow-up discussed Patient may return to MAU as needed   Charlis Harner, Odie Sera, NP 04/17/2020 11:38 AM

## 2020-04-23 ENCOUNTER — Other Ambulatory Visit: Payer: Self-pay

## 2020-04-23 ENCOUNTER — Ambulatory Visit (INDEPENDENT_AMBULATORY_CARE_PROVIDER_SITE_OTHER): Payer: Medicaid Other | Admitting: *Deleted

## 2020-04-23 ENCOUNTER — Ambulatory Visit
Admission: RE | Admit: 2020-04-23 | Discharge: 2020-04-23 | Disposition: A | Discharge status: Home / Self Care | Payer: Medicaid Other | Source: Ambulatory Visit

## 2020-04-23 DIAGNOSIS — E349 Endocrine disorder, unspecified: Secondary | ICD-10-CM | POA: Insufficient documentation

## 2020-04-23 DIAGNOSIS — Z712 Person consulting for explanation of examination or test findings: Secondary | ICD-10-CM | POA: Diagnosis not present

## 2020-04-23 NOTE — Progress Notes (Signed)
Chart reviewed for nurse visit. Agree with plan of care.   Armani Gawlik M, MD 04/23/20 11:33 AM 

## 2020-04-23 NOTE — Progress Notes (Signed)
Pt presents for [place] results which were discussed with and reviewed by Dr. Crissie Reese.  Pt was informed of viable intrauterine pregnancy and that the fetal heart rate is slightly low however since the gestation is still very early, this is not unusual. She should keep appt to begin prenatal care as scheduled on 12/17 @ [facility] office. If she develops severe abdominal pain or heavy vaginal bleeding she should go to MAU immediately for evaluation. Pt voiced understanding of all information and instructions given.

## 2020-04-26 ENCOUNTER — Encounter (HOSPITAL_COMMUNITY): Payer: Self-pay | Admitting: Obstetrics & Gynecology

## 2020-04-26 ENCOUNTER — Inpatient Hospital Stay (HOSPITAL_COMMUNITY): Payer: Medicaid Other

## 2020-04-26 ENCOUNTER — Other Ambulatory Visit: Payer: Self-pay

## 2020-04-26 ENCOUNTER — Inpatient Hospital Stay (HOSPITAL_COMMUNITY)
Admission: AD | Admit: 2020-04-26 | Discharge: 2020-04-26 | Disposition: A | Discharge status: Home / Self Care | Payer: Medicaid Other | Attending: Obstetrics & Gynecology | Admitting: Obstetrics & Gynecology

## 2020-04-26 DIAGNOSIS — Z3A01 Less than 8 weeks gestation of pregnancy: Secondary | ICD-10-CM | POA: Insufficient documentation

## 2020-04-26 DIAGNOSIS — W19XXXA Unspecified fall, initial encounter: Secondary | ICD-10-CM | POA: Diagnosis not present

## 2020-04-26 DIAGNOSIS — M791 Myalgia, unspecified site: Secondary | ICD-10-CM

## 2020-04-26 DIAGNOSIS — M7918 Myalgia, other site: Secondary | ICD-10-CM

## 2020-04-26 DIAGNOSIS — O9A211 Injury, poisoning and certain other consequences of external causes complicating pregnancy, first trimester: Secondary | ICD-10-CM

## 2020-04-26 DIAGNOSIS — O99891 Other specified diseases and conditions complicating pregnancy: Secondary | ICD-10-CM | POA: Diagnosis not present

## 2020-04-26 DIAGNOSIS — M25512 Pain in left shoulder: Secondary | ICD-10-CM | POA: Diagnosis not present

## 2020-04-26 DIAGNOSIS — M79604 Pain in right leg: Secondary | ICD-10-CM | POA: Insufficient documentation

## 2020-04-26 DIAGNOSIS — Y92414 Local residential or business street as the place of occurrence of the external cause: Secondary | ICD-10-CM | POA: Diagnosis not present

## 2020-04-26 DIAGNOSIS — R109 Unspecified abdominal pain: Secondary | ICD-10-CM | POA: Diagnosis not present

## 2020-04-26 DIAGNOSIS — Z3491 Encounter for supervision of normal pregnancy, unspecified, first trimester: Secondary | ICD-10-CM

## 2020-04-26 DIAGNOSIS — O26891 Other specified pregnancy related conditions, first trimester: Secondary | ICD-10-CM

## 2020-04-26 LAB — URINALYSIS, ROUTINE W REFLEX MICROSCOPIC
Bilirubin Urine: NEGATIVE
Glucose, UA: NEGATIVE mg/dL
Hgb urine dipstick: NEGATIVE
Ketones, ur: 20 mg/dL — AB
Leukocytes,Ua: NEGATIVE
Nitrite: NEGATIVE
Protein, ur: 30 mg/dL — AB
Specific Gravity, Urine: 1.027 (ref 1.005–1.030)
pH: 5 (ref 5.0–8.0)

## 2020-04-26 MED ORDER — CONCEPT OB 130-92.4-1 MG PO CAPS: 1.0000 | ORAL_CAPSULE | Freq: Every day | ORAL | 11 refills | Status: DC

## 2020-04-26 NOTE — Discharge Instructions (Signed)
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
## 2020-05-11 ENCOUNTER — Inpatient Hospital Stay (HOSPITAL_COMMUNITY)
Admission: AD | Admit: 2020-05-11 | Discharge: 2020-05-11 | Disposition: A | Discharge status: Home / Self Care | Payer: Medicaid Other | Attending: Obstetrics & Gynecology | Admitting: Obstetrics & Gynecology

## 2020-05-11 ENCOUNTER — Inpatient Hospital Stay (HOSPITAL_COMMUNITY): Payer: Medicaid Other

## 2020-05-11 ENCOUNTER — Encounter (HOSPITAL_COMMUNITY): Payer: Self-pay | Admitting: Obstetrics & Gynecology

## 2020-05-11 ENCOUNTER — Other Ambulatory Visit: Payer: Self-pay

## 2020-05-11 DIAGNOSIS — Z8616 Personal history of COVID-19: Secondary | ICD-10-CM | POA: Diagnosis not present

## 2020-05-11 DIAGNOSIS — O26891 Other specified pregnancy related conditions, first trimester: Secondary | ICD-10-CM | POA: Diagnosis not present

## 2020-05-11 DIAGNOSIS — Z3A08 8 weeks gestation of pregnancy: Secondary | ICD-10-CM

## 2020-05-11 DIAGNOSIS — R109 Unspecified abdominal pain: Secondary | ICD-10-CM

## 2020-05-11 DIAGNOSIS — Z20822 Contact with and (suspected) exposure to covid-19: Secondary | ICD-10-CM | POA: Insufficient documentation

## 2020-05-11 DIAGNOSIS — R051 Acute cough: Secondary | ICD-10-CM | POA: Insufficient documentation

## 2020-05-11 DIAGNOSIS — O208 Other hemorrhage in early pregnancy: Secondary | ICD-10-CM | POA: Diagnosis not present

## 2020-05-11 DIAGNOSIS — O468X1 Other antepartum hemorrhage, first trimester: Secondary | ICD-10-CM | POA: Diagnosis not present

## 2020-05-11 LAB — URINALYSIS, ROUTINE W REFLEX MICROSCOPIC
Bilirubin Urine: NEGATIVE
Glucose, UA: NEGATIVE mg/dL
Hgb urine dipstick: NEGATIVE
Ketones, ur: NEGATIVE mg/dL
Leukocytes,Ua: NEGATIVE
Nitrite: NEGATIVE
Protein, ur: NEGATIVE mg/dL
Specific Gravity, Urine: 1.018 (ref 1.005–1.030)
pH: 7 (ref 5.0–8.0)

## 2020-05-11 LAB — RESP PANEL BY RT-PCR (FLU A&B, COVID) ARPGX2
Influenza A by PCR: NEGATIVE
Influenza B by PCR: NEGATIVE
SARS Coronavirus 2 by RT PCR: NEGATIVE

## 2020-05-11 LAB — WET PREP, GENITAL
Clue Cells Wet Prep HPF POC: NONE SEEN
Sperm: NONE SEEN
Trich, Wet Prep: NONE SEEN
Yeast Wet Prep HPF POC: NONE SEEN

## 2020-05-11 LAB — CBC
HCT: 40.6 % (ref 36.0–46.0)
Hemoglobin: 13.4 g/dL (ref 12.0–15.0)
MCH: 30.6 pg (ref 26.0–34.0)
MCHC: 33 g/dL (ref 30.0–36.0)
MCV: 92.7 fL (ref 80.0–100.0)
Platelets: 377 10*3/uL (ref 150–400)
RBC: 4.38 MIL/uL (ref 3.87–5.11)
RDW: 11.7 % (ref 11.5–15.5)
WBC: 8.5 10*3/uL (ref 4.0–10.5)
nRBC: 0 % (ref 0.0–0.2)

## 2020-05-11 LAB — HIV ANTIBODY (ROUTINE TESTING W REFLEX): HIV Screen 4th Generation wRfx: NONREACTIVE

## 2020-05-11 NOTE — Discharge Instructions (Signed)
Return to MAU:  If you have heavier bleeding that soaks through more that 2 pads per hour for an hour or more  If you bleed so much that you feel like you might pass out or you do pass out  If you have significant abdominal pain that is not improved with Tylenol 1000 mg every 6 hours as needed for pain  If you develop a fever > 100.5    Safe Medications in Pregnancy   Acne: Benzoyl Peroxide Salicylic Acid  Backache/Headache: Tylenol: 2 regular strength every 4 hours OR              2 Extra strength every 6 hours  Colds/Coughs/Allergies: Benadryl (alcohol free) 25 mg every 6 hours as needed Breath right strips Claritin Cepacol throat lozenges Chloraseptic throat spray Cold-Eeze- up to three times per day Cough drops, alcohol free Flonase (by prescription only) Guaifenesin Mucinex Robitussin DM (plain only, alcohol free) Saline nasal spray/drops Sudafed (pseudoephedrine) & Actifed ** use only after [redacted] weeks gestation and if you do not have high blood pressure Tylenol Vicks Vaporub Zinc lozenges Zyrtec   Constipation: Colace Ducolax suppositories Fleet enema Glycerin suppositories Metamucil Milk of magnesia Miralax Senokot Smooth move tea  Diarrhea: Kaopectate Imodium A-D  *NO pepto Bismol  Hemorrhoids: Anusol Anusol HC Preparation H Tucks  Indigestion: Tums Maalox Mylanta Zantac  Pepcid  Insomnia: Benadryl (alcohol free) 25mg every 6 hours as needed Tylenol PM Unisom, no Gelcaps  Leg Cramps: Tums MagGel  Nausea/Vomiting:  Bonine Dramamine Emetrol Ginger extract Sea bands Meclizine  Nausea medication to take during pregnancy:  Unisom (doxylamine succinate 25 mg tablets) Take one tablet daily at bedtime. If symptoms are not adequately controlled, the dose can be increased to a maximum recommended dose of two tablets daily (1/2 tablet in the morning, 1/2 tablet mid-afternoon and one at bedtime). Vitamin B6 100mg tablets. Take one  tablet twice a day (up to 200 mg per day).  Skin Rashes: Aveeno products Benadryl cream or 25mg every 6 hours as needed Calamine Lotion 1% cortisone cream  Yeast infection: Gyne-lotrimin 7 Monistat 7   **If taking multiple medications, please check labels to avoid duplicating the same active ingredients **take medication as directed on the label ** Do not exceed 4000 mg of tylenol in 24 hours **Do not take medications that contain aspirin or ibuprofen     

## 2020-05-11 NOTE — MAU Provider Note (Signed)
History     CSN: [id number]  Arrival date and time: 05/11/20 1151   Event Date/Time   First Provider Initiated Contact with Patient 05/11/20 1251      Chief Complaint  Patient presents with  . Abdominal Pain  . Vaginal Bleeding  . Cough   [name] is a 19 y.o. year old G65P0010 female at [redacted]w[redacted]d weeks gestation who presents to MAU reporting lower abdominal cramping, spotting with wiping last night. She reports that she "just got back with boyfriend at the beginning of the week and had SI 2 days ago." She states they were broken up for about 1 month and she is not sure if he had SI with someone else. She also complains of a productive cough 5 days ago. She states that she coughs up phlegm and that triggers her to vomit. She was dx'd with COVID in August and does not feel like that. She states she went to a friend's house last Sunday who was "just getting over strep throat."   OB History    Gravida  2   Para      Term      Preterm      AB  1   Living        SAB  1   IAB      Ectopic      Multiple      Live Births              Past Medical History:  Diagnosis Date  . ADHD (attention deficit hyperactivity disorder)   . Gonorrhea 02/02/2018    History reviewed. No pertinent surgical history.  Family History  Problem Relation Age of Onset  . Healthy Mother   . Healthy Father     Social History   Tobacco Use  . Smoking status: Never Smoker  . Smokeless tobacco: Never Used  Vaping Use  . Vaping Use: Never used  Substance Use Topics  . Alcohol use: Not Currently  . Drug use: Yes    Types: Marijuana    Comment: used 3 days ago     Allergies:  Allergies  Allergen Reactions  . Nickel Rash    Medications Prior to Admission  Medication Sig Dispense Refill Last Dose  . albuterol (VENTOLIN HFA) 108 (90 Base) MCG/ACT inhaler Inhale 1-2 puffs into the lungs every 6 (six) hours as needed for wheezing or shortness of breath. 18 g 0 Past  Week at Unknown time  . fluticasone (FLONASE) 50 MCG/ACT nasal spray Place 1-2 sprays into both nostrils daily for 7 days. 1 g 0   . Prenat w/o A Vit-FeFum-FePo-FA (CONCEPT OB) 130-92.4-1 MG CAPS Take 1 capsule by mouth daily. 30 capsule 11     Review of Systems  Constitutional: Negative.   HENT: Negative.   Eyes: Negative.   Respiratory: Positive for cough. Negative for chest tightness and shortness of breath.   Cardiovascular: Negative.   Gastrointestinal: Negative.   Endocrine: Negative.   Genitourinary: Positive for pelvic pain (cramping) and vaginal bleeding (spotting).  Musculoskeletal: Negative.   Skin: Negative.   Allergic/Immunologic: Negative.   Neurological: Negative.   Hematological: Negative.   Psychiatric/Behavioral: Negative.    Physical Exam   Blood pressure 120/68, pulse 75, temperature (!) 97.5 F (36.4 C), temperature source Oral, resp. rate 16, last menstrual period 03/07/2020, SpO2 100 %.  Physical Exam Vitals and nursing note reviewed. Exam conducted with a chaperone present.  Constitutional:  Appearance: She is well-developed. She is obese.  HENT:     Head: Normocephalic and atraumatic.  Cardiovascular:     Rate and Rhythm: Normal rate.  Pulmonary:     Effort: Pulmonary effort is normal.  Abdominal:     General: Abdomen is flat.  Genitourinary:    Vagina: Normal.     Cervix: Normal.     Uterus: Enlarged.      Adnexa:        Right: Tenderness (mild) present.        Left: Tenderness (mild) present.   Skin:    General: Skin is warm and dry.  Neurological:     Mental Status: She is alert and oriented to person, place, and time.  Psychiatric:        Mood and Affect: Mood normal.        Behavior: Behavior normal.     MAU Course  Procedures  MDM CCUA CBC Wet Prep GC/CT -- pending OB < 14 wks [place] with TV COVID-19 testing   Results for orders placed or performed during the hospital encounter of 05/11/20 (from the past 24 hour(s))   Urinalysis, Routine w reflex microscopic Urine, Clean Catch     Status: Abnormal   Collection Time: 05/11/20  1:03 PM  Result Value Ref Range   Color, Urine AMBER (A) YELLOW   APPearance TURBID (A) CLEAR   Specific Gravity, Urine 1.018 1.005 - 1.030   pH 7.0 5.0 - 8.0   Glucose, UA NEGATIVE NEGATIVE mg/dL   Hgb urine dipstick NEGATIVE NEGATIVE   Bilirubin Urine NEGATIVE NEGATIVE   Ketones, ur NEGATIVE NEGATIVE mg/dL   Protein, ur NEGATIVE NEGATIVE mg/dL   Nitrite NEGATIVE NEGATIVE   Leukocytes,Ua NEGATIVE NEGATIVE   Bacteria, UA RARE (A) NONE SEEN   Squamous Epithelial / LPF 11-20 0 - 5   Amorphous Crystal PRESENT   Wet prep, genital     Status: Abnormal   Collection Time: 05/11/20  1:11 PM  Result Value Ref Range   Yeast Wet Prep HPF POC NONE SEEN NONE SEEN   Trich, Wet Prep NONE SEEN NONE SEEN   Clue Cells Wet Prep HPF POC NONE SEEN NONE SEEN   WBC, Wet Prep HPF POC MODERATE (A) NONE SEEN   Sperm NONE SEEN   Resp Panel by RT-PCR (Flu A&B, Covid) Nasopharyngeal Swab     Status: None   Collection Time: 05/11/20  1:13 PM   Specimen: Nasopharyngeal Swab; Nasopharyngeal(NP) swabs in vial transport medium  Result Value Ref Range   SARS Coronavirus 2 by RT PCR NEGATIVE NEGATIVE   Influenza A by PCR NEGATIVE NEGATIVE   Influenza B by PCR NEGATIVE NEGATIVE  CBC     Status: None   Collection Time: 05/11/20  1:45 PM  Result Value Ref Range   WBC 8.5 4.0 - 10.5 K/uL   RBC 4.38 3.87 - 5.11 MIL/uL   Hemoglobin 13.4 12.0 - 15.0 g/dL   HCT 93.9 03.0 - 09.2 %   MCV 92.7 80.0 - 100.0 fL   MCH 30.6 26.0 - 34.0 pg   MCHC 33.0 30.0 - 36.0 g/dL   RDW 33.0 07.6 - 22.6 %   Platelets 377 150 - 400 K/uL   nRBC 0.0 0.0 - 0.2 %    US OB LESS THAN 14 WEEKS WITH OB TRANSVAGINAL  Result Date: 05/11/2020 CLINICAL DATA:  Pregnant patient with pelvic pain. EXAM: OBSTETRIC <14 WK [place] AND TRANSVAGINAL OB US TECHNIQUE: Both transabdominal and transvaginal ultrasound examinations  were performed for  complete evaluation of the gestation as well as the maternal uterus, adnexal regions, and pelvic cul-de-sac. Transvaginal technique was performed to assess early pregnancy. COMPARISON:  Pelvic ultrasound 04/26/2020 FINDINGS: Intrauterine gestational sac: Single Yolk sac:  Visualized. Embryo:  Visualized. Cardiac Activity: Visualized. Heart Rate: 170 bpm CRL:  21.1 mm   8 w   5 d                  [place] EDC: 12/16/2020 Subchorionic hemorrhage:  Small Maternal uterus/adnexae: Normal right and left ovaries. Small cyst within the left ovary. Retroverted uterus. No free fluid in the pelvis. IMPRESSION: Single live intrauterine gestation.  Small subchorionic hemorrhage. Electronically Signed   By: Annia Belt M.D.   On: 05/11/2020 14:52     Assessment and Plan  Abdominal pain during pregnancy in first trimester  - Information provided on abd pain in preg   Subchorionic hemorrhage of placenta in first trimester, single or unspecified fetus  - Information provided on [facility], threatened miscarriage, VB in preg  - Discharge patient - Keep scheduled appt at [facility] on 06/01/19 - Patient verbalized an understanding of the plan of care and agrees.   Raelyn Mora, MSN 05/11/2020, 1:18 PM

## 2020-05-11 NOTE — MAU Note (Signed)
Pt reports to mau with c/o lower abd cramping and some spotting when she wipes.  Pt reports last IC 2 days ago.  Pt reports cramping and spotting started last night.  Pt also requesting STD testing.  Pt also reports a productive cough for the past 5 days.  Denies Loss of taste or smell.  Denies Sore throat.

## 2020-05-13 LAB — GC/CHLAMYDIA PROBE AMP (~~LOC~~) NOT AT ARMC
Chlamydia: NEGATIVE
Comment: NEGATIVE
Comment: NORMAL
Neisseria Gonorrhea: NEGATIVE

## 2020-05-26 ENCOUNTER — Inpatient Hospital Stay (HOSPITAL_COMMUNITY)
Admission: AD | Admit: 2020-05-26 | Discharge: 2020-05-26 | Disposition: A | Discharge status: Home / Self Care | Payer: Medicaid Other | Attending: Obstetrics & Gynecology | Admitting: Obstetrics & Gynecology

## 2020-05-26 ENCOUNTER — Encounter (HOSPITAL_COMMUNITY): Payer: Self-pay | Admitting: Obstetrics & Gynecology

## 2020-05-26 DIAGNOSIS — N930 Postcoital and contact bleeding: Secondary | ICD-10-CM

## 2020-05-26 DIAGNOSIS — O99891 Other specified diseases and conditions complicating pregnancy: Secondary | ICD-10-CM | POA: Diagnosis not present

## 2020-05-26 DIAGNOSIS — O209 Hemorrhage in early pregnancy, unspecified: Secondary | ICD-10-CM | POA: Insufficient documentation

## 2020-05-26 DIAGNOSIS — R1032 Left lower quadrant pain: Secondary | ICD-10-CM | POA: Insufficient documentation

## 2020-05-26 DIAGNOSIS — O468X1 Other antepartum hemorrhage, first trimester: Secondary | ICD-10-CM

## 2020-05-26 DIAGNOSIS — O26891 Other specified pregnancy related conditions, first trimester: Secondary | ICD-10-CM | POA: Insufficient documentation

## 2020-05-26 DIAGNOSIS — O418X10 Other specified disorders of amniotic fluid and membranes, first trimester, not applicable or unspecified: Secondary | ICD-10-CM

## 2020-05-26 DIAGNOSIS — Z3A10 10 weeks gestation of pregnancy: Secondary | ICD-10-CM | POA: Insufficient documentation

## 2020-05-26 NOTE — Discharge Instructions (Signed)

## 2020-05-26 NOTE — MAU Provider Note (Addendum)
Event Date/Time   First Provider Initiated Contact with Patient 05/26/20 [id number]     S [name] is a 20 y.o. G2P0010 pregnant female at [redacted]w[redacted]d who presents to MAU today via EMS with complaint of lower left abdominal pain and post-coital spotting of dark red to brown blood. She had IC at 4am, woke at 6:30am with mild cramping and the bleeding with wiping and called EMS. She was seen on 05/11/20 in MAU for abdominal pain and diagnosed with a small SCH and luteal cyst on the left ovary.   O BP 122/68 (BP Location: Right Arm)   Pulse 96   Temp 99.4 F (37.4 C) (Oral)   Resp 16   LMP 03/07/2020   SpO2 100%  Physical Exam Vitals and nursing note reviewed. Exam conducted with a chaperone present.  Constitutional:      General: She is not in acute distress.    Appearance: She is well-developed and normal weight. She is not ill-appearing.  Cardiovascular:     Rate and Rhythm: Normal rate and regular rhythm.  Pulmonary:     Effort: Pulmonary effort is normal.  Abdominal:     Palpations: Abdomen is soft.     Tenderness: There is no abdominal tenderness.  Genitourinary:    Vagina: Bleeding (dark brown in the introitus, none coming from cervix) present. No tenderness.     Cervix: Normal.     Uterus: Normal.   Skin:    General: Skin is warm and dry.     Capillary Refill: Capillary refill takes less than 2 seconds.  Neurological:     Mental Status: She is alert and oriented to person, place, and time.  Psychiatric:        Mood and Affect: Mood normal.        Behavior: Behavior normal.    Confirmed U/S on 05/11/20 showed Navos and left luteal cyst. Explained that [facility] can cause post-coital bleeding and cramping, gave reassurance that bleeding was not actively coming from os. Advised she may see some additional spotting with wiping today as there is scant blood left in the introitus. Pt verbalized understanding.  FHR: 160 using bedside U/S  A Post-coital bleeding r/t  subchorionic hemorrhage  P Discharge from MAU in stable condition with bleeding precautions Initial OB visit on 06/01/19 with Gerrit Heck, CNM Warning signs for worsening condition that would warrant emergency follow-up discussed  Bernerd Limbo, CNM 05/26/2020 7:01 AM

## 2020-05-26 NOTE — MAU Note (Signed)
.  Bethany Avery is a 20 y.o. at [redacted]w[redacted]d here in MAU reporting: lower left abdominal pain that began around 5 am. Patient reports some vaginal bleeding.  Last Int: 4 am   Pain score: 6/10 Vitals:   05/26/20 0630  BP: 122/68  Pulse: 96  Resp: 16  Temp: 99.4 F (37.4 C)  SpO2: 100%

## 2020-05-30 ENCOUNTER — Ambulatory Visit (INDEPENDENT_AMBULATORY_CARE_PROVIDER_SITE_OTHER): Payer: Medicaid Other | Admitting: Licensed Clinical Social Worker

## 2020-05-30 DIAGNOSIS — Z8659 Personal history of other mental and behavioral disorders: Secondary | ICD-10-CM

## 2020-05-30 NOTE — BH Specialist Note (Addendum)
Integrated Behavioral Health Initial Visit  MRN: [id number] Name: Bethany Avery  Number of Integrated Behavioral Health Clinician visits:: 1/6 Session Start time: 10:15am  Session End time: 10:53am Total time: 38 minutes via MYCHART  Types of Service: General Behavioral Integrated Care (BHI)  Interpretor:No. Interpretor Name and Language: None    Warm Hand Off Completed.     Pt: Home  Provider: [facility]   I connected with Bethany Avery and n/a by Telephone  (Video is Surveyor, mining) and verified that I am speaking with the correct person using two identifiers.Discussed confidentiality: Yes   I discussed the limitations of telemedicine and the availability of in person appointments.  Discussed there is a possibility of technology failure and discussed alternative modes of communication if that failure occurs.  I discussed that engaging in this telemedicine visit, they consent to the provision of behavioral healthcare and the services will be billed under their insurance.  Patient and/or legal guardian expressed understanding and consented to Telemedicine visit: Yes    Subjective: Bethany Avery is a 20 y.o. female accompanied by n/a Patient was referred by self referred  Patient reports the following symptoms/concerns: history of depression Duration of problem: adolescence; Severity of problem: mild  Objective: Mood: Euthymic and Affect: Appropriate Risk of harm to self or others: No plan to harm self or others  Life Context: Family and Social: Lives with father of baby in [place]  School/Work: Warehouse   Self-Care: n/a Life Changes: New pregnancy   Patient and/or Family's Strengths/Protective Factors: Concrete supports in place (healthy food, safe environments, etc.)  Goals Addressed: Patient will: 1. Reduce symptoms of: depression 2. Increase knowledge and/or ability of: healthy habits  3. Demonstrate ability to:  Increase healthy adjustment to current life circumstances  Progress towards Goals: Ongoing  Interventions: Interventions utilized: Supportive Counseling  Standardized Assessments completed: PHQ 9    Assessment: Patient has history of depression. Bethany Avery reports difficulty sustaining healthy relationships and trust. Bethany Avery will began identifying contributing factors that prevent building healthy relationships. Bethany Avery will began modeling positive communication and conflict resolution to effectively manage interpersonal and professional relationships    Patient may benefit from integrated behavioral health   Plan: 1. Follow up with behavioral health clinician on : 3 weeks via mychart  2. Behavioral recommendations: Learn and demonstrate effective communication skills, prioritize rest, take prenatal vitamins as directed by medical professional, and attend all scheduled appointments.  3. Referral(s): [facility] 4. "From scale of 1-10, how likely are you to follow plan?":   Bethany Saxon, LCSW

## 2020-05-31 ENCOUNTER — Other Ambulatory Visit: Payer: Self-pay

## 2020-05-31 ENCOUNTER — Ambulatory Visit (INDEPENDENT_AMBULATORY_CARE_PROVIDER_SITE_OTHER): Payer: Medicaid Other

## 2020-05-31 VITALS — BP 108/70 | HR 85 | Temp 97.9°F | Wt 202.2 lb

## 2020-05-31 DIAGNOSIS — O219 Vomiting of pregnancy, unspecified: Secondary | ICD-10-CM

## 2020-05-31 DIAGNOSIS — O99210 Obesity complicating pregnancy, unspecified trimester: Secondary | ICD-10-CM | POA: Insufficient documentation

## 2020-05-31 DIAGNOSIS — Z3A11 11 weeks gestation of pregnancy: Secondary | ICD-10-CM

## 2020-05-31 DIAGNOSIS — O0990 Supervision of high risk pregnancy, unspecified, unspecified trimester: Secondary | ICD-10-CM | POA: Insufficient documentation

## 2020-05-31 DIAGNOSIS — Z3480 Encounter for supervision of other normal pregnancy, unspecified trimester: Secondary | ICD-10-CM | POA: Insufficient documentation

## 2020-05-31 DIAGNOSIS — Z8659 Personal history of other mental and behavioral disorders: Secondary | ICD-10-CM | POA: Insufficient documentation

## 2020-05-31 HISTORY — DX: Obesity complicating pregnancy, unspecified trimester: O99.210

## 2020-05-31 HISTORY — DX: Supervision of high risk pregnancy, unspecified, unspecified trimester: O09.90

## 2020-05-31 MED ORDER — ASPIRIN EC 81 MG PO TBEC: 81.0000 mg | DELAYED_RELEASE_TABLET | Freq: Every day | ORAL | 2 refills | Status: DC

## 2020-05-31 MED ORDER — GOJJI WEIGHT SCALE MISC: 1.0000 | Freq: Every day | 0 refills | Status: DC | PRN

## 2020-05-31 MED ORDER — DOXYLAMINE-PYRIDOXINE 10-10 MG PO TBEC
2.0000 | DELAYED_RELEASE_TABLET | Freq: Every day | ORAL | 2 refills | Status: DC
Start: 2020-05-31 — End: 2020-12-01

## 2020-05-31 MED ORDER — BLOOD PRESSURE MONITOR AUTOMAT DEVI: 1.0000 | Freq: Every day | 0 refills | Status: DC

## 2020-05-31 NOTE — Assessment & Plan Note (Signed)
Recommendations Arly.Keller ] Aspirin 81 mg daily after 12 weeks; discontinue after 36 weeks [ ]  Nutrition consult ] Weight gain 11-20 lbs for singleton and 25-35 lbs for twin pregnancy (IOM guidelines) . Higher class of obesity patients recommended to gain closer to lower limit  . Weight loss is associated with adverse outcomes 11-28-1993 ] Screen for DM with A1C or early 2 hr GTT Arly.Keller ] Baseline and surveillance labs (pulled in from [facility], refresh links as needed)  Lab Results  Component Value Date   PLT 377 05/11/2020   CREATININE 0.75 04/04/2020   AST 18 04/04/2020   ALT 18 04/04/2020   Antenatal Testing: Not indicated.  [ ]  Growth scans every 4-6 weeks as needed (fundal height likely inadequate in morbidly obese patients)  Postpartum Care: [ ]  Consider prophylactic wound vac/PICO for C/S [ ]  Lovenox for DVT/PE prophylaxis (6 hours after vaginal delivery, 12 hours after C/S).    Lovenox 40 mg [place] q24h (BMI 30.0-39.9 kg/m2)   Lovenox 0.5 mg/kg [place] q12h ((BMI ?40 kg/m2 ); Max 150 mg [place] q12h.   Consider prolonged therapy x 6 weeks PP in very concerning patients (I.e morbid obesity with other co-morbidities that increase risk of DVT/PE) [ ]  Counsel about diet, exercise and weight loss. Referrals PRN.

## 2020-05-31 NOTE — Patient Instructions (Signed)
Eating Plan for Pregnant Women While you are pregnant, your body requires additional nutrition to help support your growing baby. You also have a higher need for some vitamins and minerals, such as folic acid, calcium, iron, and vitamin D. Eating a healthy, well-balanced diet is very important for your health and your baby's health. Your need for extra calories varies for the three 3-month segments of your pregnancy (trimesters). For most women, it is recommended to consume:  150 extra calories a day during the first trimester.  300 extra calories a day during the second trimester.  300 extra calories a day during the third trimester. What are tips for following this plan?   Do not try to lose weight or go on a diet during pregnancy.  Limit your overall intake of foods that have "empty calories." These are foods that have little nutritional value, such as sweets, desserts, candies, and sugar-sweetened beverages.  Eat a variety of foods (especially fruits and vegetables) to get a full range of vitamins and minerals.  Take a prenatal vitamin to help meet your additional vitamin and mineral needs during pregnancy, specifically for folic acid, iron, calcium, and vitamin D.  Remember to stay active. Ask your health care provider what types of exercise and activities are safe for you.  Practice good food safety and cleanliness. Wash your hands before you eat and after you prepare raw meat. Wash all fruits and vegetables well before peeling or eating. Taking these actions can help to prevent food-borne illnesses that can be very dangerous to your baby, such as listeriosis. Ask your health care provider for more information about listeriosis. What does 150 extra calories look like? Healthy options that provide 150 extra calories each day could be any of the following:  6-8 oz (170-230 g) of plain low-fat yogurt with  cup of berries.  1 apple with 2 teaspoons (11 g) of peanut butter.  Cut-up  vegetables with  cup (60 g) of hummus.  8 oz (230 mL) or 1 cup of low-fat chocolate milk.  1 stick of string cheese with 1 medium orange.  1 peanut butter and jelly sandwich that is made with one slice of whole-wheat bread and 1 tsp (5 g) of peanut butter. For 300 extra calories, you could eat two of those healthy options each day. What is a healthy amount of weight to gain? The right amount of weight gain for you is based on your BMI before you became pregnant. If your BMI:  Was less than 18 (underweight), you should gain 28-40 lb (13-18 kg).  Was 18-24.9 (normal), you should gain 25-35 lb (11-16 kg).  Was 25-29.9 (overweight), you should gain 15-25 lb (7-11 kg).  Was 30 or greater (obese), you should gain 11-20 lb (5-9 kg). What if I am having twins or multiples? Generally, if you are carrying twins or multiples:  You may need to eat 300-600 extra calories a day.  The recommended range for total weight gain is 25-54 lb (11-25 kg), depending on your BMI before pregnancy.  Talk with your health care provider to find out about nutritional needs, weight gain, and exercise that is right for you. What foods can I eat?  Fruits All fruits. Eat a variety of colors and types of fruit. Remember to wash your fruits well before peeling or eating. Vegetables All vegetables. Eat a variety of colors and types of vegetables. Remember to wash your vegetables well before peeling or eating. Grains All grains. Choose whole grains, such   as whole-wheat bread, oatmeal, or brown rice. Meats and other protein foods Lean meats, including chicken, turkey, fish, and lean cuts of beef, veal, or pork. If you eat fish or seafood, choose options that are higher in omega-3 fatty acids and lower in mercury, such as salmon, herring, mussels, trout, sardines, pollock, shrimp, crab, and lobster. Tofu. Tempeh. Beans. Eggs. Peanut butter and other nut butters. Make sure that all meats, poultry, and eggs are cooked to  food-safe temperatures or "well-done." Two or more servings of fish are recommended each week in order to get the most benefits from omega-3 fatty acids that are found in seafood. Choose fish that are lower in mercury. You can find more information online:  www.fda.gov Dairy Pasteurized milk and milk alternatives (such as almond milk). Pasteurized yogurt and pasteurized cheese. Cottage cheese. Sour cream. Beverages Water. Juices that contain 100% fruit juice or vegetable juice. Caffeine-free teas and decaffeinated coffee. Drinks that contain caffeine are okay to drink, but it is better to avoid caffeine. Keep your total caffeine intake to less than 200 mg each day (which is 12 oz or 355 mL of coffee, tea, or soda) or the limit as told by your health care provider. Fats and oils Fats and oils are okay to include in moderation. Sweets and desserts Sweets and desserts are okay to include in moderation. Seasoning and other foods All pasteurized condiments. The items listed above may not be a complete list of foods and beverages you can eat. Contact a dietitian for more information. What foods are not recommended? Fruits Unpasteurized fruit juices. Vegetables Raw (unpasteurized) vegetable juices. Meats and other protein foods Lunch meats, bologna, hot dogs, or other deli meats. (If you must eat those meats, reheat them until they are steaming hot.) Refrigerated pat, meat spreads from a meat counter, smoked seafood that is found in the refrigerated section of a store. Raw or undercooked meats, poultry, and eggs. Raw fish, such as sushi or sashimi. Fish that have high mercury content, such as tilefish, shark, swordfish, and king mackerel. To learn more about mercury in fish, talk with your health care provider or look for online resources, such as:  www.fda.gov Dairy Raw (unpasteurized) milk and any foods that have raw milk in them. Soft cheeses, such as feta, queso blanco, queso fresco, Brie,  Camembert cheeses, blue-veined cheeses, and Panela cheese (unless it is made with pasteurized milk, which must be stated on the label). Beverages Alcohol. Sugar-sweetened beverages, such as sodas, teas, or energy drinks. Seasoning and other foods Homemade fermented foods and drinks, such as pickles, sauerkraut, or kombucha drinks. (Store-bought pasteurized versions of these are okay.) Salads that are made in a store or deli, such as ham salad, chicken salad, egg salad, tuna salad, and seafood salad. The items listed above may not be a complete list of foods and beverages you should avoid. Contact a dietitian for more information. Where to find more information To calculate the number of calories you need based on your height, weight, and activity level, you can use an online calculator such as:  www.choosemyplate.gov/MyPlatePlan To calculate how much weight you should gain during pregnancy, you can use an online pregnancy weight gain calculator such as:  www.choosemyplate.gov/pregnancy-weight-gain-calculator Summary  While you are pregnant, your body requires additional nutrition to help support your growing baby.  Eat a variety of foods, especially fruits and vegetables to get a full range of vitamins and minerals.  Practice good food safety and cleanliness. Wash your hands before you eat   and after you prepare raw meat. Wash all fruits and vegetables well before peeling or eating. Taking these actions can help to prevent food-borne illnesses, such as listeriosis, that can be very dangerous to your baby.  Do not eat raw meat or fish. Do not eat fish that have high mercury content, such as tilefish, shark, swordfish, and king mackerel. Do not eat unpasteurized (raw) dairy.  Take a prenatal vitamin to help meet your additional vitamin and mineral needs during pregnancy, specifically for folic acid, iron, calcium, and vitamin D. This information is not intended to replace advice given to you by  your health care provider. Make sure you discuss any questions you have with your health care provider. Document Revised: 09/29/2018 Document Reviewed: 02/05/2017 Elsevier Patient Education  2020 Elsevier Inc.  

## 2020-05-31 NOTE — Progress Notes (Signed)
Subjective:   Bethany Avery is a 20 y.o. G2P0010 at [redacted]w[redacted]d by 5.6 week early ultrasound being seen today for her first obstetrical visit.  She is accompanied by her mother, Helmut Muster.   Gynecological/Obstetrical History: Her obstetrical history is significant for obesity. Patient does intend to breast feed. Pregnancy history fully reviewed. Patient reports nausea.  She states she is nauseous throughout the day and dependent upon what she eats.  She does desire medication for management and states she has not tried anything.  She states she has tried "the crackers, but it doesn't really help that much I still be nauseous."  She reports that her appetite has been abnormal due to nausea.  She also reports some feeling of lightheadedness when she gets hot.   Sexual Activity and Vaginal Concerns: She denies vaginal bleeding, discharge, and bleeding.    She reports some left sided pain with sexual activity. She states that "the deeper he goes the more it starts to hurt."  She reports it does resolve with position change and reports some pain afterwards for ~20 minutes, but it does not occur consistently.  Last sexual activity 2 days ago with pain.    Medical History/ROS:  Patient denies significant medical history including heart conditions, bleeding/clotting disorders, asthma or other respiratory disorders. She reports history of depression and is not currently taking medication. She reports taking medication in the past and discontinued last year at her own discretion.  Patient denies pain or discomfort with urination.  No constipation or diarrhea.   Social History: Patient denies current usage of tobacco and alcohol. She reports history of alcohol usage and states she has decreased her MJ usage. She reports smoking 3-5 blunts daily and now smokes 1-2x daily, but not a whole blunt.  She clarifies that she smokes to help curb nausea.  She denies usage of other drugs.   Patient reports the FOB  is Festus Barren who is involved, but not present today due to having court today.  Patient reports that she lives with Irving Burton and endorses safety at home.  Patient denies DV/A. Patient is currently employed at a [employer] and is about to start a work from home job in the next week.  HISTORY: OB History  Gravida Para Term Preterm AB Living  2 0 0 0 1 0  SAB IAB Ectopic Multiple Live Births  1 0 0 0 0    # Outcome Date GA Lbr Len/2nd Weight Sex Delivery Anes PTL Lv  2 Current           1 SAB 08/03/19 [redacted]w[redacted]d    SAB       No pap smear on file d/t age.   Past Medical History:  Diagnosis Date   ADHD (attention deficit hyperactivity disorder)    Gonorrhea 02/02/2018   History reviewed. No pertinent surgical history. Family History  Problem Relation Age of Onset   Healthy Mother    Healthy Father    Social History   Tobacco Use   Smoking status: Never Smoker   Smokeless tobacco: Never Used  Building services engineer Use: Never used  Substance Use Topics   Alcohol use: Not Currently   Drug use: Yes    Types: Marijuana    Comment: used this week   Allergies  Allergen Reactions   Nickel Rash   Current Outpatient Medications on File Prior to Visit  Medication Sig Dispense Refill   albuterol (VENTOLIN HFA) 108 (90 Base) MCG/ACT inhaler  Inhale 1-2 puffs into the lungs every 6 (six) hours as needed for wheezing or shortness of breath. 18 g 0   Prenat w/o A Vit-FeFum-FePo-FA (CONCEPT OB) 130-92.4-1 MG CAPS Take 1 capsule by mouth daily. 30 capsule 11   fluticasone (FLONASE) 50 MCG/ACT nasal spray Place 1-2 sprays into both nostrils daily for 7 days. 1 g 0   No current facility-administered medications on file prior to visit.    Review of Systems Pertinent items noted in HPI and remainder of comprehensive ROS otherwise negative.  Exam   Vitals:   05/31/20 0915  BP: 108/70  Pulse: 85  Temp: 97.9 F (36.6 C)  Weight: 202 lb 3.2 oz (91.7 kg)        Physical  Exam Constitutional:      Appearance: Normal appearance.  HENT:     Head: Normocephalic and atraumatic.  Eyes:     Conjunctiva/sclera: Conjunctivae normal.  Neck:     Thyroid: No thyroid mass, thyromegaly or thyroid tenderness.  Cardiovascular:     Rate and Rhythm: Normal rate and regular rhythm.     Heart sounds: Normal heart sounds.  Pulmonary:     Effort: Pulmonary effort is normal.     Breath sounds: Normal breath sounds.  Abdominal:     General: Bowel sounds are normal.     Tenderness: There is no abdominal tenderness.     Comments: BSUS performed and fetal movement noted. Doppler applied and FHTs 161  Musculoskeletal:        General: Normal range of motion.     Cervical back: Normal range of motion. No rigidity.  Neurological:     Mental Status: She is alert and oriented to person, place, and time.  Skin:    General: Skin is warm and dry.  Psychiatric:        Mood and Affect: Mood normal.        Behavior: Behavior normal.        Thought Content: Thought content normal.  Vitals reviewed.     Assessment:   20 y.o. year old G2P0010 Patient Active Problem List   Diagnosis Date Noted   Supervision of other normal pregnancy, antepartum 05/31/2020   History of depression 05/31/2020   Obesity in pregnancy, antepartum 05/31/2020   Adjustment disorder with mixed anxiety and depressed mood 04/02/2020   Adolescent behavior problem      Plan:  1. Supervision of other normal pregnancy, antepartum -Congratulations given and patient welcomed to practice. -Discussed usage of Babyscripts and virtual visits as additional source of managing and completing PN visits in midst of coronavirus.   *Instructed to take blood pressure and record weekly into babyscripts. *Reviewed modified prenatal visit schedule and platforms used for virtual visits.  -Anticipatory guidance for prenatal visits including labs, ultrasounds, and testing; Initial labs drawn. -Genetic Screening  discussed, Quad screen and NIPS: ordered. -Encouraged to complete MyChart Registration for her ability to review results, send requests, and have questions addressed.  -Discussed estimated due date of December 18, 2020. -Ultrasound discussed; fetal anatomic survey: ordered. -Continue prenatal vitamins; Rx sent to pharmacy on file.  -Influenza offered and patient is considering. -Encouraged to seek out care at office or emergency room for urgent and/or emergent concerns. -Educated on the nature of [place] [facility] with multiple MDs and other Advanced Practice Providers was explained to patient; also emphasized that residents, students are part of our team. Informed of her right to refuse care as she deems appropriate.  -  No questions or concerns.   2. Nausea and vomiting in pregnancy -Discussed usage of antiemetic for management. -Rx for Diclegis sent to pharmacy on file.  -Encouraged complete MJ cessation ~ 10 days after daily dosing for avoidance of hyperemesis reaction.   3. Obesity in pregnancy, antepartum -Discussed BMI of 34 and need for bASA dosing.  -Informed of EBR that supplements bASA dosing for decreased incident/risk of PreEclampsia in pregnancy. -Patient informed of risk factors including nulla para, AA decent, and BMI. -Instructed to start regime in 2-3 weeks.   -RX sent to pharmacy on file.   4. History of depression -Stable. -Discussed risk of increased depression during prenatal and postpartum period.  -Informed of availability of ambulatory BHS if symptoms arise.   Problem list reviewed and updated. Routine obstetric precautions reviewed.  No orders of the defined types were placed in this encounter.   No follow-ups on file.     Maryann Conners, CNM 05/31/2020 9:35 AM

## 2020-06-01 LAB — CBC/D/PLT+RPR+RH+ABO+RUB AB...
Antibody Screen: NEGATIVE
Basophils Absolute: 0.1 10*3/uL (ref 0.0–0.2)
Basos: 1 %
EOS (ABSOLUTE): 0.2 10*3/uL (ref 0.0–0.4)
Eos: 3 %
HCV Ab: <0.1 s/co ratio (ref 0.0–0.9)
HIV Screen 4th Generation wRfx: NONREACTIVE
Hematocrit: 37.2 % (ref 34.0–46.6)
Hemoglobin: 13 g/dL (ref 11.1–15.9)
Hepatitis B Surface Ag: NEGATIVE
Immature Grans (Abs): 0 10*3/uL (ref 0.0–0.1)
Immature Granulocytes: 1 %
Lymphocytes Absolute: 1.2 10*3/uL (ref 0.7–3.1)
Lymphs: 16 %
MCH: 31.3 pg (ref 26.6–33.0)
MCHC: 34.9 g/dL (ref 31.5–35.7)
MCV: 90 fL (ref 79–97)
Monocytes Absolute: 0.6 10*3/uL (ref 0.1–0.9)
Monocytes: 9 %
Neutrophils Absolute: 5.2 10*3/uL (ref 1.4–7.0)
Neutrophils: 70 %
Platelets: 372 10*3/uL (ref 150–450)
RBC: 4.15 x10E6/uL (ref 3.77–5.28)
RDW: 11.5 % — ABNORMAL LOW (ref 11.7–15.4)
RPR Ser Ql: NONREACTIVE
Rh Factor: POSITIVE
Rubella Antibodies, IGG: 4.22 index (ref >0.99)
WBC: 7.2 10*3/uL (ref 3.4–10.8)

## 2020-06-01 LAB — HEMOGLOBIN A1C
Est. average glucose Bld gHb Est-mCnc: 108 mg/dL
Hgb A1c MFr Bld: 5.4 % (ref 4.8–5.6)

## 2020-06-01 LAB — HCV INTERPRETATION

## 2020-06-02 LAB — URINE CULTURE, OB REFLEX

## 2020-06-02 LAB — CULTURE, OB URINE

## 2020-06-13 ENCOUNTER — Inpatient Hospital Stay (HOSPITAL_COMMUNITY)
Admission: EM | Admit: 2020-06-13 | Discharge: 2020-06-13 | Disposition: A | Discharge status: Home / Self Care | Payer: Medicaid Other | Attending: Obstetrics & Gynecology | Admitting: Obstetrics & Gynecology

## 2020-06-13 ENCOUNTER — Other Ambulatory Visit: Payer: Self-pay

## 2020-06-13 ENCOUNTER — Encounter (HOSPITAL_COMMUNITY): Payer: Self-pay | Admitting: Obstetrics & Gynecology

## 2020-06-13 DIAGNOSIS — Z3491 Encounter for supervision of normal pregnancy, unspecified, first trimester: Secondary | ICD-10-CM

## 2020-06-13 DIAGNOSIS — Z79899 Other long term (current) drug therapy: Secondary | ICD-10-CM | POA: Insufficient documentation

## 2020-06-13 DIAGNOSIS — R109 Unspecified abdominal pain: Secondary | ICD-10-CM | POA: Diagnosis present

## 2020-06-13 DIAGNOSIS — Z3A13 13 weeks gestation of pregnancy: Secondary | ICD-10-CM

## 2020-06-13 DIAGNOSIS — M7918 Myalgia, other site: Secondary | ICD-10-CM | POA: Diagnosis not present

## 2020-06-13 DIAGNOSIS — R1032 Left lower quadrant pain: Secondary | ICD-10-CM | POA: Insufficient documentation

## 2020-06-13 DIAGNOSIS — Z7982 Long term (current) use of aspirin: Secondary | ICD-10-CM | POA: Diagnosis not present

## 2020-06-13 DIAGNOSIS — O26891 Other specified pregnancy related conditions, first trimester: Secondary | ICD-10-CM | POA: Diagnosis not present

## 2020-06-13 LAB — CBC
HCT: 34.1 % — ABNORMAL LOW (ref 36.0–46.0)
Hemoglobin: 11.7 g/dL — ABNORMAL LOW (ref 12.0–15.0)
MCH: 30.5 pg (ref 26.0–34.0)
MCHC: 34.3 g/dL (ref 30.0–36.0)
MCV: 89 fL (ref 80.0–100.0)
Platelets: 381 10*3/uL (ref 150–400)
RBC: 3.83 MIL/uL — ABNORMAL LOW (ref 3.87–5.11)
RDW: 11.6 % (ref 11.5–15.5)
WBC: 10 10*3/uL (ref 4.0–10.5)
nRBC: 0 % (ref 0.0–0.2)

## 2020-06-13 LAB — WET PREP, GENITAL
Sperm: NONE SEEN
Trich, Wet Prep: NONE SEEN

## 2020-06-13 LAB — URINALYSIS, ROUTINE W REFLEX MICROSCOPIC
Bilirubin Urine: NEGATIVE
Glucose, UA: NEGATIVE mg/dL
Hgb urine dipstick: NEGATIVE
Ketones, ur: NEGATIVE mg/dL
Leukocytes,Ua: NEGATIVE
Nitrite: NEGATIVE
Protein, ur: NEGATIVE mg/dL
Specific Gravity, Urine: 1.019 (ref 1.005–1.030)
pH: 7 (ref 5.0–8.0)

## 2020-06-13 LAB — RAPID URINE DRUG SCREEN, HOSP PERFORMED
Amphetamines: NOT DETECTED
Barbiturates: NOT DETECTED
Benzodiazepines: NOT DETECTED
Cocaine: NOT DETECTED
Opiates: NOT DETECTED
Tetrahydrocannabinol: POSITIVE — AB

## 2020-06-13 MED ORDER — ACETAMINOPHEN 500 MG PO TABS
1000.0000 mg | ORAL_TABLET | Freq: Once | ORAL | Status: AC
  Administered 2020-06-13: 1000 mg via ORAL
  Filled 2020-06-13: qty 2

## 2020-06-13 NOTE — Discharge Instructions (Signed)
Obstetrics: Normal and Problem Pregnancies (7th ed., pp. 102-121). [place], [place]: Elsevier."> Textbook of Family Medicine (9th ed., pp. 365-410). [place], [place]: [employer].">  First Trimester of Pregnancy  The first trimester of pregnancy starts on the first day of your last menstrual period until the end of week 12. This is months 1 through 3 of pregnancy. A week after a sperm fertilizes an egg, the egg will implant into the wall of the uterus and begin to develop into a baby. By the end of 12 weeks, all the baby's organs will be formed and the baby will be 2-3 inches in size. Body changes during your first trimester Your body goes through many changes during pregnancy. The changes vary and generally return to normal after your baby is born. Physical changes  You may gain or lose weight.  Your breasts may begin to grow larger and become tender. The tissue that surrounds your nipples (areola) may become darker.  Dark spots or blotches (chloasma or mask of pregnancy) may develop on your face.  You may have changes in your hair. These can include thickening or thinning of your hair or changes in texture. Health changes  You may feel nauseous, and you may vomit.  You may have heartburn.  You may develop headaches.  You may develop constipation.  Your gums may bleed and may be sensitive to brushing and flossing. Other changes  You may tire easily.  You may urinate more often.  Your menstrual periods will stop.  You may have a loss of appetite.  You may develop cravings for certain kinds of food.  You may have changes in your emotions from day to day.  You may have more vivid and strange dreams. Follow these instructions at home: Medicines  Follow your health care provider's instructions regarding medicine use. Specific medicines may be either safe or unsafe to take during pregnancy. Do not take any medicines unless told to by your health care provider.  Take a  prenatal vitamin that contains at least 600 micrograms (mcg) of folic acid. Eating and drinking  Eat a healthy diet that includes fresh fruits and vegetables, whole grains, good sources of protein such as meat, eggs, or tofu, and low-fat dairy products.  Avoid raw meat and unpasteurized juice, milk, and cheese. These carry germs that can harm you and your baby.  If you feel nauseous or you vomit: ? Eat 4 or 5 small meals a day instead of 3 large meals. ? Try eating a few soda crackers. ? Drink liquids between meals instead of during meals.  You may need to take these actions to prevent or treat constipation: ? Drink enough fluid to keep your urine pale yellow. ? Eat foods that are high in fiber, such as beans, whole grains, and fresh fruits and vegetables. ? Limit foods that are high in fat and processed sugars, such as fried or sweet foods. Activity  Exercise only as directed by your health care provider. Most people can continue their usual exercise routine during pregnancy. Try to exercise for 30 minutes at least 5 days a week.  Stop exercising if you develop pain or cramping in the lower abdomen or lower back.  Avoid exercising if it is very hot or humid or if you are at high altitude.  Avoid heavy lifting.  If you choose to, you may have sex unless your health care provider tells you not to. Relieving pain and discomfort  Wear a good support bra to relieve breast   tenderness.  Rest with your legs elevated if you have leg cramps or low back pain.  If you develop bulging veins (varicose veins) in your legs: ? Wear support hose as told by your health care provider. ? Elevate your feet for 15 minutes, 3-4 times a day. ? Limit salt in your diet. Safety  Wear your seat belt at all times when driving or riding in a car.  Talk with your health care provider if someone is verbally or physically abusive to you.  Talk with your health care provider if you are feeling sad or have  thoughts of hurting yourself. Lifestyle  Do not use hot tubs, steam rooms, or saunas.  Do not douche. Do not use tampons or scented sanitary pads.  Do not use herbal remedies, alcohol, illegal drugs, or medicines that are not approved by your health care provider. Chemicals in these products can harm your baby.  Do not use any products that contain nicotine or tobacco, such as cigarettes, e-cigarettes, and chewing tobacco. If you need help quitting, ask your health care provider.  Avoid cat litter boxes and soil used by cats. These carry germs that can cause birth defects in the baby and possibly loss of the unborn baby (fetus) by miscarriage or stillbirth. General instructions  During routine prenatal visits in the first trimester, your health care provider will do a physical exam, perform necessary tests, and ask you how things are going. Keep all follow-up visits. This is important.  Ask for help if you have counseling or nutritional needs during pregnancy. Your health care provider can offer advice or refer you to specialists for help with various needs.  Schedule a dentist appointment. At home, brush your teeth with a soft toothbrush. Floss gently.  Write down your questions. Take them to your prenatal visits. Where to find more information  American Pregnancy Association: americanpregnancy.org  American College of Obstetricians and Gynecologists: acog.org/en/Womens%20Health/Pregnancy  Office on Women's Health: womenshealth.gov/pregnancy Contact a health care provider if you have:  Dizziness.  A fever.  Mild pelvic cramps, pelvic pressure, or nagging pain in the abdominal area.  Nausea, vomiting, or diarrhea that lasts for 24 hours or longer.  A bad-smelling vaginal discharge.  Pain when you urinate.  Known exposure to a contagious illness, such as chickenpox, measles, Zika virus, HIV, or hepatitis. Get help right away if you have:  Spotting or bleeding from your  vagina.  Severe abdominal cramping or pain.  Shortness of breath or chest pain.  Any kind of trauma, such as from a fall or a car crash.  New or increased pain, swelling, or redness in an arm or leg. Summary  The first trimester of pregnancy starts on the first day of your last menstrual period until the end of week 12 (months 1 through 3).  Eating 4 or 5 small meals a day rather than 3 large meals may help to relieve nausea and vomiting.  Do not use any products that contain nicotine or tobacco, such as cigarettes, e-cigarettes, and chewing tobacco. If you need help quitting, ask your health care provider.  Keep all follow-up visits. This is important. This information is not intended to replace advice given to you by your health care provider. Make sure you discuss any questions you have with your health care provider. Document Revised: 10/18/2019 Document Reviewed: 08/24/2019 Elsevier Patient Education  2021 [employer].  Safe Medications in Pregnancy   Acne: Benzoyl Peroxide Salicylic Acid  Backache/Headache: Tylenol: 2 regular strength every   4 hours OR              2 Extra strength every 6 hours  Colds/Coughs/Allergies: Benadryl (alcohol free) 25 mg every 6 hours as needed Breath right strips Claritin Cepacol throat lozenges Chloraseptic throat spray Cold-Eeze- up to three times per day Cough drops, alcohol free Flonase (by prescription only) Guaifenesin Mucinex Robitussin DM (plain only, alcohol free) Saline nasal spray/drops Sudafed (pseudoephedrine) & Actifed ** use only after [redacted] weeks gestation and if you do not have high blood pressure Tylenol Vicks Vaporub Zinc lozenges Zyrtec   Constipation: Colace Ducolax suppositories Fleet enema Glycerin suppositories Metamucil Milk of magnesia Miralax Senokot Smooth move tea  Diarrhea: Kaopectate Imodium A-D  *NO pepto Bismol  Hemorrhoids: Anusol Anusol HC Preparation  H Tucks  Indigestion: Tums Maalox Mylanta Zantac  Pepcid  Insomnia: Benadryl (alcohol free) 25mg every 6 hours as needed Tylenol PM Unisom, no Gelcaps  Leg Cramps: Tums MagGel  Nausea/Vomiting:  Bonine Dramamine Emetrol Ginger extract Sea bands Meclizine  Nausea medication to take during pregnancy:  Unisom (doxylamine succinate 25 mg tablets) Take one tablet daily at bedtime. If symptoms are not adequately controlled, the dose can be increased to a maximum recommended dose of two tablets daily (1/2 tablet in the morning, 1/2 tablet mid-afternoon and one at bedtime). Vitamin B6 100mg tablets. Take one tablet twice a day (up to 200 mg per day).  Skin Rashes: Aveeno products Benadryl cream or 25mg every 6 hours as needed Calamine Lotion 1% cortisone cream  Yeast infection: Gyne-lotrimin 7 Monistat 7   **If taking multiple medications, please check labels to avoid duplicating the same active ingredients **take medication as directed on the label ** Do not exceed 4000 mg of tylenol in 24 hours **Do not take medications that contain aspirin or ibuprofen      

## 2020-06-13 NOTE — MAU Note (Addendum)
Right abdominal cramping, radiating to vagina and HA since since yesterday, "I felt like I was about to pass out earlier when I was cooking and my chest hurt a little bit"Last intercourse was yesterday.

## 2020-06-13 NOTE — ED Triage Notes (Signed)
Emergency Medicine Provider OB Triage Evaluation Note  Bethany Avery is a 20 y.o. female, G2P0010, at [redacted]w[redacted]d gestation who presents to the emergency department with complaints of abdominal pain.  Patient states she is 13 weeks 3 days pregnant.  [facility]. She has had confirmation of IUP with ultrasound.  She has had intermittent abdominal pain throughout pregnancy, worse in the past day or 2.  It is mostly on the right side.  It is sharp and severe.  She is not taken nothing for it.  No associated fevers, chills, nausea, vomit, vaginal bleeding or discharge.  She has had 1 previous pregnancy, ended in miscarriage  Review of  Systems  Positive: abd pain Negative: fever, n/v, vag bleeding  Physical Exam  BP 116/70 (BP Location: Right Arm)   Pulse 81   Temp 98.1 F (36.7 C) (Oral)   Resp 17   LMP 03/07/2020   SpO2 98%  General: Awake, no distress  HEENT: Atraumatic  Resp: Normal effort  Cardiac: Normal rate Abd: ttp of lower abd, R>L.   MSK: Moves all extremities without difficulty Neuro: Speech clear  Medical Decision Making  Pt evaluated for pregnancy concern and is stable for transfer to MAU. Pt is in agreement with plan for transfer.  9:37 PM Discussed with MAU APP, who accepts patient in transfer.  Clinical Impression  No diagnosis found.     Alveria Apley, PA-C 06/13/20 2139

## 2020-06-13 NOTE — MAU Provider Note (Signed)
History     CSN: [id number]  Arrival date and time: 06/13/20 2125   Event Date/Time   First Provider Initiated Contact with Patient 06/13/20 2228      Chief Complaint  Patient presents with  . Abdominal Pain   HPI Bethany Avery is a 20 y.o. G2P0010 at [redacted]w[redacted]d who presents to MAU with chief complaint of abdominal pain. This is a new problem. Patient previously complained of LLQ pain but states her pain is now inferior to her umbilicus near the right margin of her lower mid abdomen. She denies aggravating or alleviating factors. She has not taken medication or tried other treatments for this complaint. She states her FOB does not keep medicine in the house so she does not have access to anything but her previously prescribed bASA and antiemetics.  Patient also c/o pain with intercourse. Degree of pain is influenced by position. Female partner, penetrative sex. She endorses pain with all penetration which worsens over time. Patient stops when she tells him to stop. She denies concern for IPV.  She denies abdominal tenderness, vaginal bleeding, abnormal vaginal discharge, dysuria, fever or recent illness. Most recent bowel movement this morning.  She receives care with [facility]  OB History    Gravida  2   Para      Term      Preterm      AB  1   Living        SAB  1   IAB      Ectopic      Multiple      Live Births              Past Medical History:  Diagnosis Date  . ADHD (attention deficit hyperactivity disorder)   . Gonorrhea 02/02/2018    History reviewed. No pertinent surgical history.  Family History  Problem Relation Age of Onset  . Healthy Mother   . Healthy Father     Social History   Tobacco Use  . Smoking status: Never Smoker  . Smokeless tobacco: Never Used  Vaping Use  . Vaping Use: Never used  Substance Use Topics  . Alcohol use: Not Currently  . Drug use: Yes    Types: Marijuana    Comment: used this week     Allergies:  Allergies  Allergen Reactions  . Nickel Rash    Medications Prior to Admission  Medication Sig Dispense Refill Last Dose  . albuterol (VENTOLIN HFA) 108 (90 Base) MCG/ACT inhaler Inhale 1-2 puffs into the lungs every 6 (six) hours as needed for wheezing or shortness of breath. 18 g 0   . aspirin EC 81 MG tablet Take 1 tablet (81 mg total) by mouth daily. 60 tablet 2   . Blood Pressure Monitoring (BLOOD PRESSURE MONITOR AUTOMAT) DEVI 1 Device by Does not apply route daily. Automatic blood pressure cuff large size. To monitor blood pressure regularly at home. ICD-10 code:Z34.90 1 each 0   . Doxylamine-Pyridoxine 10-10 MG TBEC Take 2 tablets by mouth at bedtime. 60 tablet 2   . fluticasone (FLONASE) 50 MCG/ACT nasal spray Place 1-2 sprays into both nostrils daily for 7 days. 1 g 0   . Misc. Devices (GOJJI WEIGHT SCALE) MISC 1 Device by Does not apply route daily as needed. To weight self daily as needed at home. ICD-10 code: Z34.90 1 each 0   . Prenat w/o A Vit-FeFum-FePo-FA (CONCEPT OB) 130-92.4-1 MG CAPS Take 1 capsule by mouth daily. 30 capsule  11     Review of Systems  Gastrointestinal: Positive for abdominal pain.  Genitourinary: Negative for dysuria and vaginal bleeding.  All other systems reviewed and are negative.  Physical Exam   Blood pressure 127/71, pulse 77, temperature 98.5 F (36.9 C), temperature source Oral, resp. rate 16, height 5\' 5"  (1.651 m), weight 92.1 kg, last menstrual period 03/07/2020, SpO2 98 %.  Physical Exam Vitals and nursing note reviewed. Exam conducted with a chaperone present.  Constitutional:      Appearance: She is well-developed.  Cardiovascular:     Rate and Rhythm: Normal rate.     Heart sounds: Normal heart sounds.  Pulmonary:     Effort: Pulmonary effort is normal.     Breath sounds: Normal breath sounds.  Abdominal:     General: Bowel sounds are normal.     Palpations: Abdomen is soft.     Tenderness: There is no  abdominal tenderness. There is no right CVA tenderness, left CVA tenderness, guarding or rebound.    Skin:    General: Skin is warm and dry.     Capillary Refill: Capillary refill takes less than 2 seconds.  Neurological:     Mental Status: She is alert and oriented to person, place, and time.  Psychiatric:        Mood and Affect: Mood normal.        Behavior: Behavior normal.     MAU Course  Procedures  --Clue cells not c/w patient report or exam. Will not treat for Bacterial Vaginosis --Patient sleeping at 1130 --Pertinent negatives: RLQ pain, abdominal tenderness, elevated WBCs, fever, persistent pain  Patient Vitals for the past 24 hrs:  BP Temp Temp src Pulse Resp SpO2 Height Weight  06/13/20 2215 127/71 98.5 F (36.9 C) Oral 77 16 -- -- --  06/13/20 2202 -- -- -- -- -- -- 5\' 5"  (1.651 m) 92.1 kg  06/13/20 2129 116/70 98.1 F (36.7 C) Oral 81 17 98 % -- --   Results for orders placed or performed during the hospital encounter of 06/13/20 (from the past 24 hour(s))  Urinalysis, Routine w reflex microscopic     Status: Abnormal   Collection Time: 06/13/20 10:05 PM  Result Value Ref Range   Color, Urine YELLOW YELLOW   APPearance CLOUDY (A) CLEAR   Specific Gravity, Urine 1.019 1.005 - 1.030   pH 7.0 5.0 - 8.0   Glucose, UA NEGATIVE NEGATIVE mg/dL   Hgb urine dipstick NEGATIVE NEGATIVE   Bilirubin Urine NEGATIVE NEGATIVE   Ketones, ur NEGATIVE NEGATIVE mg/dL   Protein, ur NEGATIVE NEGATIVE mg/dL   Nitrite NEGATIVE NEGATIVE   Leukocytes,Ua NEGATIVE NEGATIVE  Rapid urine drug screen (hospital performed)     Status: Abnormal   Collection Time: 06/13/20 10:21 PM  Result Value Ref Range   Opiates NONE DETECTED NONE DETECTED   Cocaine NONE DETECTED NONE DETECTED   Benzodiazepines NONE DETECTED NONE DETECTED   Amphetamines NONE DETECTED NONE DETECTED   Tetrahydrocannabinol POSITIVE (A) NONE DETECTED   Barbiturates NONE DETECTED NONE DETECTED  CBC     Status: Abnormal    Collection Time: 06/13/20 10:38 PM  Result Value Ref Range   WBC 10.0 4.0 - 10.5 K/uL   RBC 3.83 (L) 3.87 - 5.11 MIL/uL   Hemoglobin 11.7 (L) 12.0 - 15.0 g/dL   HCT 06/15/20 (L) 06/15/20 - 62.6 %   MCV 89.0 80.0 - 100.0 fL   MCH 30.5 26.0 - 34.0 pg   MCHC 34.3 30.0 -  36.0 g/dL   RDW 17.7 93.9 - 03.0 %   Platelets 381 150 - 400 K/uL   nRBC 0.0 0.0 - 0.2 %  Wet prep, genital     Status: Abnormal   Collection Time: 06/13/20 10:48 PM  Result Value Ref Range   Yeast Wet Prep HPF POC PRESENT (A) NONE SEEN   Trich, Wet Prep NONE SEEN NONE SEEN   Clue Cells Wet Prep HPF POC PRESENT (A) NONE SEEN   WBC, Wet Prep HPF POC MANY (A) NONE SEEN   Sperm NONE SEEN    Assessment and Plan  --20 y.o. G2P0010 at [redacted]w[redacted]d  --FHT 154 by Doppler --Musculoskeletal pain, resolved with Tylenol --Discharge home in stable condition  Calvert Cantor, CNM 06/13/2020, 11:47 PM

## 2020-06-14 LAB — GC/CHLAMYDIA PROBE AMP (~~LOC~~) NOT AT ARMC
Chlamydia: NEGATIVE
Comment: NEGATIVE
Comment: NORMAL
Neisseria Gonorrhea: NEGATIVE

## 2020-06-14 LAB — COMPREHENSIVE METABOLIC PANEL
ALT: 23 U/L (ref 0–44)
AST: 16 U/L (ref 15–41)
Albumin: 3.3 g/dL — ABNORMAL LOW (ref 3.5–5.0)
Alkaline Phosphatase: 45 U/L (ref 38–126)
Anion gap: 10 (ref 5–15)
BUN: 6 mg/dL (ref 6–20)
CO2: 21 mmol/L — ABNORMAL LOW (ref 22–32)
Calcium: 8.9 mg/dL (ref 8.9–10.3)
Chloride: 105 mmol/L (ref 98–111)
Creatinine, Ser: 0.47 mg/dL (ref 0.44–1.00)
GFR, Estimated: >60 mL/min (ref >60)
Glucose, Bld: 105 mg/dL — ABNORMAL HIGH (ref 70–99)
Potassium: 3.9 mmol/L (ref 3.5–5.1)
Sodium: 136 mmol/L (ref 135–145)
Total Bilirubin: 0.4 mg/dL (ref 0.3–1.2)
Total Protein: 6.5 g/dL (ref 6.5–8.1)

## 2020-06-17 ENCOUNTER — Encounter: Payer: Self-pay | Admitting: *Deleted

## 2020-06-18 ENCOUNTER — Other Ambulatory Visit: Payer: Self-pay | Admitting: *Deleted

## 2020-06-18 DIAGNOSIS — B373 Candidiasis of vulva and vagina: Secondary | ICD-10-CM

## 2020-06-18 DIAGNOSIS — B3731 Acute candidiasis of vulva and vagina: Secondary | ICD-10-CM

## 2020-06-18 MED ORDER — TERCONAZOLE 0.4 % VA CREA: 1.0000 | TOPICAL_CREAM | Freq: Every day | VAGINAL | 0 refills | Status: DC

## 2020-06-19 ENCOUNTER — Ambulatory Visit (INDEPENDENT_AMBULATORY_CARE_PROVIDER_SITE_OTHER): Payer: Medicaid Other | Admitting: Licensed Clinical Social Worker

## 2020-06-19 ENCOUNTER — Other Ambulatory Visit: Payer: Self-pay | Admitting: *Deleted

## 2020-06-19 DIAGNOSIS — Z8659 Personal history of other mental and behavioral disorders: Secondary | ICD-10-CM

## 2020-06-19 DIAGNOSIS — B9689 Other specified bacterial agents as the cause of diseases classified elsewhere: Secondary | ICD-10-CM

## 2020-06-19 DIAGNOSIS — A599 Trichomoniasis, unspecified: Secondary | ICD-10-CM

## 2020-06-19 MED ORDER — METRONIDAZOLE 500 MG PO TABS: 500.0000 mg | ORAL_TABLET | Freq: Two times a day (BID) | ORAL | 0 refills | Status: DC

## 2020-06-19 NOTE — BH Specialist Note (Addendum)
Integrated Behavioral Health Follow Up Visit  MRN: [id number] Name: Bethany Avery  Number of Integrated Behavioral Health Clinician visits: 2/6 Session Start time: 9:35am  Session End time: 9:55am Total time: 20 minutes via mychart   PT:  home  Provider: [facility]   Types of Service: General Behavioral Integrated Care (BHI)  Interpretor:No. Interpretor Name and Language: none  Subjective: Bethany Avery is a 20 y.o. female accompanied by n/a Patient was self referred. Patient reports the following symptoms/concerns: history of depression Duration of problem: adolescene; Severity of problem: mild  Objective: Mood: Euthymic and Affect: Appropriate Risk of harm to self or others: No plan to harm self or others  Life Context: Family and Social: Lives with FOB in [place]  School/Work: New employment working from home  Self-Care: n/a Life Changes: New pregnancy   Patient and/or Family's Strengths/Protective Factors: Concrete supports in place (healthy food, safe environments, etc.)  Goals Addressed: Patient will: 1.  Reduce symptoms of: depression  2.  Increase knowledge and/or ability of: coping skills  3.  Demonstrate ability to: Increase healthy adjustment to current life circumstances  Progress towards Goals: Ongoing  Interventions: Interventions utilized:  Supportive Counseling Standardized Assessments completed: Not Needed   Assessment: Patient has a history of depression. Ms. Schlauch reports coping skills are effective symptoms are improving. According to Ms. Szymborski, argumentative behaviors have decreased due to conflict resolution skills and FOB on the road. Ms. Kassel does not report any new depression symptoms at this time.   Patient may benefit from integrated behavior health   Plan: 1. Follow up with behavioral health clinician on : 3-4 weeks via mychart  1. Behavioral recommendations: demonstrate effective communication skills,  prioritize rest, take prenatal vitamins and prescriptions as directed by medical professional, and attend all scheduled appointments.  2. Referral(s): n/a 3. "From scale of 1-10, how likely are you to follow plan?":  [name], LCSW

## 2020-06-19 NOTE — Telephone Encounter (Signed)
Called patient regarding MAU and lab result. Patient was upset they did not mentioned that she had BV or yeast. She saw her results on Mychart. Patient stated she is having symptoms now +odor,white discharge. Medication for BV sent to pharmacy. Medication for yeast sent yesterday 06/18/20. Please see Mychart messages for prior concerns.  Clovis Pu, RN

## 2020-07-02 ENCOUNTER — Encounter: Payer: Self-pay | Admitting: Student

## 2020-07-03 ENCOUNTER — Encounter: Payer: Self-pay | Admitting: *Deleted

## 2020-07-03 ENCOUNTER — Telehealth (INDEPENDENT_AMBULATORY_CARE_PROVIDER_SITE_OTHER): Payer: Medicaid Other | Admitting: Student

## 2020-07-03 VITALS — BP 132/95 | HR 92

## 2020-07-03 DIAGNOSIS — O139 Gestational [pregnancy-induced] hypertension without significant proteinuria, unspecified trimester: Secondary | ICD-10-CM

## 2020-07-03 DIAGNOSIS — O99213 Obesity complicating pregnancy, third trimester: Secondary | ICD-10-CM | POA: Diagnosis not present

## 2020-07-03 DIAGNOSIS — O26892 Other specified pregnancy related conditions, second trimester: Secondary | ICD-10-CM

## 2020-07-03 DIAGNOSIS — Z3480 Encounter for supervision of other normal pregnancy, unspecified trimester: Secondary | ICD-10-CM

## 2020-07-03 DIAGNOSIS — Z3A16 16 weeks gestation of pregnancy: Secondary | ICD-10-CM

## 2020-07-03 DIAGNOSIS — R519 Headache, unspecified: Secondary | ICD-10-CM

## 2020-07-03 DIAGNOSIS — R030 Elevated blood-pressure reading, without diagnosis of hypertension: Secondary | ICD-10-CM

## 2020-07-03 DIAGNOSIS — O99342 Other mental disorders complicating pregnancy, second trimester: Secondary | ICD-10-CM

## 2020-07-03 DIAGNOSIS — Z148 Genetic carrier of other disease: Secondary | ICD-10-CM

## 2020-07-03 DIAGNOSIS — F4323 Adjustment disorder with mixed anxiety and depressed mood: Secondary | ICD-10-CM

## 2020-07-03 HISTORY — DX: Genetic carrier of other disease: Z14.8

## 2020-07-03 HISTORY — DX: Gestational (pregnancy-induced) hypertension without significant proteinuria, unspecified trimester: O13.9

## 2020-07-03 HISTORY — DX: Headache, unspecified: R51.9

## 2020-07-03 HISTORY — DX: Other specified pregnancy related conditions, second trimester: O26.892

## 2020-07-03 MED ORDER — METOCLOPRAMIDE HCL 10 MG PO TABS: 10.0000 mg | ORAL_TABLET | Freq: Three times a day (TID) | ORAL | 0 refills | Status: DC | PRN

## 2020-07-03 MED ORDER — CYCLOBENZAPRINE HCL 5 MG PO TABS: 5.0000 mg | ORAL_TABLET | Freq: Three times a day (TID) | ORAL | 0 refills | Status: DC | PRN

## 2020-07-03 NOTE — Patient Instructions (Addendum)
For prevention of migraines in pregnancy: -Magnesium, 400mg  by mouth, once daily -Vitamin B2, 400mg  by mouth, once daily  For treatment of migraines in pregnancy: -take medication at the first sign of the pain of a headache, or the first sign of your aura -start with 1000mg  Tylenol (do not exceed 4000mg  of Tylenol in 24hrs), with or without Reglan 10mg  -if no relief after 1-2hours, can take Flexeril 10mg       Back Pain in Pregnancy Back pain during pregnancy is common. Back pain may be caused by several factors that are related to changes during your pregnancy. Follow these instructions at home: Managing pain, stiffness, and swelling  If directed, for sudden (acute) back pain, put ice on the painful area. ? Put ice in a plastic bag. ? Place a towel between your skin and the bag. ? Leave the ice on for 20 minutes, 2-3 times per day.  If directed, apply heat to the affected area before you exercise. Use the heat source that your health care provider recommends, such as a moist heat pack or a heating pad. ? Place a towel between your skin and the heat source. ? Leave the heat on for 20-30 minutes. ? Remove the heat if your skin turns bright red. This is especially important if you are unable to feel pain, heat, or cold. You may have a greater risk of getting burned.  If directed, massage the affected area.      Activity  Exercise as told by your health care provider. Gentle exercise is the best way to prevent or manage back pain.  Listen to your body when lifting. If lifting hurts, ask for help or bend your knees. This uses your leg muscles instead of your back muscles.  Squat down when picking up something from the floor. Do not bend over.  Only use bed rest for short periods as told by your health care provider. Bed rest should only be used for the most severe episodes of back pain. Standing, sitting, and lying down  Do not stand in one place for long periods of time.  Use  good posture when sitting. Make sure your head rests over your shoulders and is not hanging forward. Use a pillow on your lower back if necessary.  Try sleeping on your side, preferably the left side, with a pregnancy support pillow or 1-2 regular pillows between your legs. ? If you have back pain after a night's rest, your bed may be too soft. ? A firm mattress may provide more support for your back during pregnancy. General instructions  Do not wear high heels.  Eat a healthy diet. Try to gain weight within your health care provider's recommendations.  Use a maternity girdle, elastic sling, or back brace as told by your health care provider.  Take over-the-counter and prescription medicines only as told by your health care provider.  Work with a physical therapist or massage therapist to find ways to manage back pain. Acupuncture or massage therapy may be helpful.  Keep all follow-up visits as told by your health care provider. This is important. Contact a health care provider if:  Your back pain interferes with your daily activities.  You have increasing pain in other parts of your body. Get help right away if:  You develop numbness, tingling, weakness, or problems with the use of your arms or legs.  You develop severe back pain that is not controlled with medicine.  You have a change in bowel or bladder  control.  You develop shortness of breath, dizziness, or you faint.  You develop nausea, vomiting, or sweating.  You have back pain that is a rhythmic, cramping pain similar to labor pains. Labor pain is usually 1-2 minutes apart, lasts for about 1 minute, and involves a bearing down feeling or pressure in your pelvis.  You have back pain and your water breaks or you have vaginal bleeding.  You have back pain or numbness that travels down your leg.  Your back pain developed after you fell.  You develop pain on one side of your back.  You see blood in your urine.  You  develop skin blisters in the area of your back pain. Summary  Back pain may be caused by several factors that are related to changes during your pregnancy.  Follow instructions as told by your health care provider for managing pain, stiffness, and swelling.  Exercise as told by your health care provider. Gentle exercise is the best way to prevent or manage back pain.  Take over-the-counter and prescription medicines only as told by your health care provider.  Keep all follow-up visits as told by your health care provider. This is important. This information is not intended to replace advice given to you by your health care provider. Make sure you discuss any questions you have with your health care provider. Document Revised: 08/30/2018 Document Reviewed: 10/27/2017 Elsevier Patient Education  2021 [employer].

## 2020-07-03 NOTE — Progress Notes (Signed)
I connected with Bethany Avery 07/03/20 at  8:10 AM EST by: MyChart video and verified that I am speaking with the correct person using two identifiers.  Patient is located at home and provider is located at [facility].     The purpose of this virtual visit is to provide medical care while limiting exposure to the novel coronavirus. I discussed the limitations, risks, security and privacy concerns of performing an evaluation and management service by MyChart video and the availability of in person appointments. I also discussed with the patient that there may be a patient responsible charge related to this service. By engaging in this virtual visit, you consent to the provision of healthcare.  Additionally, you authorize for your insurance to be billed for the services provided during this visit.  The patient expressed understanding and agreed to proceed.  The following staff members participated in the virtual visit:  Dorisann Frames, RN    PRENATAL VISIT NOTE  Subjective:  Bethany Avery is a 20 y.o. G2P0010 at [redacted]w[redacted]d  for phone visit for ongoing prenatal care.  She is currently monitored for the following issues for this low-risk pregnancy and has Adolescent behavior problem; Adjustment disorder with mixed anxiety and depressed mood; Supervision of other normal pregnancy, antepartum; History of depression; Obesity in pregnancy, antepartum; Marijuana user; and Attention deficit hyperactivity disorder on their problem list.  Patient reports backache and headache.  Contractions: Not present. Vag. Bleeding: None.  Movement: Absent. Denies leaking of fluid.   Reports daily headaches. Takes tylenol and goes to sleep, headaches resolve but return the next day. Has had headaches before but not as frequent. Also reports increase in back pain.   The following portions of the patient's history were reviewed and updated as appropriate: allergies, current medications, past family history,  past medical history, past social history, past surgical history and problem list.   Objective:   Vitals:   07/03/20 0819  BP: (!) 132/95  Pulse: 92   Self-Obtained  Fetal Status:     Movement: Absent     Assessment and Plan:  Pregnancy: G2P0010 at [redacted]w[redacted]d 1. Supervision of other normal pregnancy, antepartum -discussed upcoming appointments & informed of scheduled anatomy scan  2. Headache in pregnancy, antepartum, second trimester -likely hormonal. Discussed continued use of tylenol with addition of reglan. Can add flexeril if needed. If headaches worsen will consider referral to the headache clinic.   - metoCLOPramide (REGLAN) 10 MG tablet; Take 1 tablet (10 mg total) by mouth every 8 (eight) hours as needed (take with tylenol for headaches).  Dispense: 30 tablet; Refill: 0 - cyclobenzaprine (FLEXERIL) 5 MG tablet; Take 1 tablet (5 mg total) by mouth 3 (three) times daily as needed (for headachec).  Dispense: 20 tablet; Refill: 0  3. Elevated BP without diagnosis of hypertension -no diagnosis of hypertension. Looked through previous visits in Epic & do not see any other elevations. Will continue to monitor. Requested patient to check BP weekly & send to [place] via my chart with parameters of 140/90 -Taking baby ASA  4. Carrier of spinal muscular atrophy -will contact [facility]ater[facility] genetic counselor for FOB testing   Preterm labor symptoms and general obstetric precautions including but not limited to vaginal bleeding, contractions, leaking of fluid and fetal movement were reviewed in detail with the patient.  Return in about 4 weeks (around 07/31/2020) for Routine OB, virtual.  No future appointments.   Time spent on virtual visit: 18 minutes  Judeth Horn, NP

## 2020-07-05 ENCOUNTER — Other Ambulatory Visit: Payer: Self-pay

## 2020-07-05 DIAGNOSIS — Z3480 Encounter for supervision of other normal pregnancy, unspecified trimester: Secondary | ICD-10-CM

## 2020-07-05 DIAGNOSIS — Z148 Genetic carrier of other disease: Secondary | ICD-10-CM

## 2020-07-24 ENCOUNTER — Ambulatory Visit: Payer: Medicaid Other | Admitting: *Deleted

## 2020-07-24 ENCOUNTER — Other Ambulatory Visit: Payer: Self-pay | Admitting: Obstetrics

## 2020-07-24 ENCOUNTER — Ambulatory Visit: Payer: Medicaid Other

## 2020-07-24 ENCOUNTER — Other Ambulatory Visit: Payer: Self-pay

## 2020-07-24 ENCOUNTER — Encounter: Payer: Self-pay | Admitting: *Deleted

## 2020-07-24 DIAGNOSIS — O99210 Obesity complicating pregnancy, unspecified trimester: Secondary | ICD-10-CM

## 2020-07-24 DIAGNOSIS — E669 Obesity, unspecified: Secondary | ICD-10-CM

## 2020-07-24 DIAGNOSIS — O99212 Obesity complicating pregnancy, second trimester: Secondary | ICD-10-CM

## 2020-07-24 DIAGNOSIS — O358XX0 Maternal care for other (suspected) fetal abnormality and damage, not applicable or unspecified: Secondary | ICD-10-CM

## 2020-07-24 DIAGNOSIS — Z148 Genetic carrier of other disease: Secondary | ICD-10-CM

## 2020-07-24 DIAGNOSIS — Z3480 Encounter for supervision of other normal pregnancy, unspecified trimester: Secondary | ICD-10-CM

## 2020-07-24 DIAGNOSIS — Z362 Encounter for other antenatal screening follow-up: Secondary | ICD-10-CM

## 2020-07-24 DIAGNOSIS — Z363 Encounter for antenatal screening for malformations: Secondary | ICD-10-CM | POA: Insufficient documentation

## 2020-07-24 DIAGNOSIS — Z3A19 19 weeks gestation of pregnancy: Secondary | ICD-10-CM | POA: Insufficient documentation

## 2020-07-25 ENCOUNTER — Ambulatory Visit (INDEPENDENT_AMBULATORY_CARE_PROVIDER_SITE_OTHER): Payer: Medicaid Other | Admitting: *Deleted

## 2020-07-25 ENCOUNTER — Other Ambulatory Visit (HOSPITAL_COMMUNITY)
Admission: RE | Admit: 2020-07-25 | Discharge: 2020-07-25 | Disposition: A | Discharge status: Home / Self Care | Payer: Medicaid Other | Source: Ambulatory Visit | Attending: Obstetrics and Gynecology | Admitting: Obstetrics and Gynecology

## 2020-07-25 VITALS — BP 108/74 | HR 94 | Temp 98.0°F | Ht 65.0 in | Wt 207.6 lb

## 2020-07-25 DIAGNOSIS — O26899 Other specified pregnancy related conditions, unspecified trimester: Secondary | ICD-10-CM | POA: Diagnosis present

## 2020-07-25 DIAGNOSIS — N898 Other specified noninflammatory disorders of vagina: Secondary | ICD-10-CM

## 2020-07-25 NOTE — Progress Notes (Signed)
   SUBJECTIVE:  20 y.o. female complains of white vaginal discharge for 1 week(s). Denies abnormal vaginal bleeding or significant pelvic pain or fever. No UTI symptoms. Denies history of known exposure to STD.  Patient's last menstrual period was 03/07/2020.  OBJECTIVE:  She appears well, afebrile. Urine dipstick: not done.  ASSESSMENT:  Vaginal Discharge  Vaginal Odor   PLAN:  GC, chlamydia, trichomonas, BVAG, CVAG probe sent to lab. Treatment: To be determined once lab results are received ROV prn if symptoms persist or worsen.  Clovis Pu, RN

## 2020-07-29 ENCOUNTER — Telehealth: Payer: Self-pay | Admitting: *Deleted

## 2020-07-29 DIAGNOSIS — O98819 Other maternal infectious and parasitic diseases complicating pregnancy, unspecified trimester: Secondary | ICD-10-CM

## 2020-07-29 DIAGNOSIS — B373 Candidiasis of vulva and vagina: Secondary | ICD-10-CM

## 2020-07-29 DIAGNOSIS — B9689 Other specified bacterial agents as the cause of diseases classified elsewhere: Secondary | ICD-10-CM

## 2020-07-29 DIAGNOSIS — A749 Chlamydial infection, unspecified: Secondary | ICD-10-CM

## 2020-07-29 DIAGNOSIS — B3731 Acute candidiasis of vulva and vagina: Secondary | ICD-10-CM

## 2020-07-29 DIAGNOSIS — A599 Trichomoniasis, unspecified: Secondary | ICD-10-CM

## 2020-07-29 LAB — CERVICOVAGINAL ANCILLARY ONLY
Bacterial Vaginitis (gardnerella): NEGATIVE
Candida Glabrata: NEGATIVE
Candida Vaginitis: POSITIVE — AB
Chlamydia: POSITIVE — AB
Comment: NEGATIVE
Comment: NEGATIVE
Comment: NEGATIVE
Comment: NEGATIVE
Comment: NEGATIVE
Comment: NORMAL
Neisseria Gonorrhea: NEGATIVE
Trichomonas: POSITIVE — AB

## 2020-07-29 MED ORDER — AZITHROMYCIN 500 MG PO TABS
1000.0000 mg | ORAL_TABLET | Freq: Every day | ORAL | 0 refills | Status: AC
Start: 2020-07-29 — End: 2020-07-30

## 2020-07-29 MED ORDER — METRONIDAZOLE 500 MG PO TABS: 500.0000 mg | ORAL_TABLET | Freq: Two times a day (BID) | ORAL | 0 refills | Status: DC

## 2020-07-29 MED ORDER — TERCONAZOLE 0.4 % VA CREA: 1.0000 | TOPICAL_CREAM | Freq: Every day | VAGINAL | 0 refills | Status: DC

## 2020-07-29 NOTE — Telephone Encounter (Signed)
-----   Message from Raelyn Mora, [place] sent at 07/29/2020  2:17 PM EST ----- Please treat for CT, Trich and then yeast. She has an appt on Wednesday 07/31/20

## 2020-07-29 NOTE — Telephone Encounter (Signed)
    S: Patient called today for STD treatment of chlamydia, Trich and yeast.    O: Need for treatment of Chlamydia, Trich and yeast infections.  A: RX Azithromycin 1 GM PO x 1, Metronidazole 500 mg 1 tab PO BID x 7 days and terazole vaginal cream 1 app qhs per Raelyn Mora, CNM  orders.    P: Patient to follow up in 1 month for re-screening.    STD report form fax completed and faxed to [facility] Department at [phone] (STD department).     Patient advised to abstain from sex for 7-10 days after treatment or when partner has been tested/treated.    Clovis Pu, RN

## 2020-07-31 ENCOUNTER — Other Ambulatory Visit: Payer: Self-pay

## 2020-07-31 ENCOUNTER — Encounter: Payer: Self-pay | Admitting: Obstetrics and Gynecology

## 2020-07-31 ENCOUNTER — Telehealth (INDEPENDENT_AMBULATORY_CARE_PROVIDER_SITE_OTHER): Payer: Medicaid Other | Admitting: Obstetrics and Gynecology

## 2020-07-31 VITALS — BP 126/57 | HR 89 | Wt 206.0 lb

## 2020-07-31 DIAGNOSIS — Z3480 Encounter for supervision of other normal pregnancy, unspecified trimester: Secondary | ICD-10-CM

## 2020-07-31 DIAGNOSIS — A749 Chlamydial infection, unspecified: Secondary | ICD-10-CM

## 2020-07-31 DIAGNOSIS — O219 Vomiting of pregnancy, unspecified: Secondary | ICD-10-CM

## 2020-07-31 DIAGNOSIS — Z3A20 20 weeks gestation of pregnancy: Secondary | ICD-10-CM

## 2020-07-31 DIAGNOSIS — O98819 Other maternal infectious and parasitic diseases complicating pregnancy, unspecified trimester: Secondary | ICD-10-CM

## 2020-07-31 MED ORDER — ONDANSETRON 4 MG PO TBDP: 4.0000 mg | ORAL_TABLET | Freq: Three times a day (TID) | ORAL | 0 refills | Status: DC | PRN

## 2020-07-31 MED ORDER — AZITHROMYCIN 500 MG PO TABS: 1000.0000 mg | ORAL_TABLET | Freq: Once | ORAL | 0 refills | Status: AC

## 2020-07-31 NOTE — Progress Notes (Signed)
   MY CHART VIDEO VIRTUAL OBSTETRICS VISIT ENCOUNTER NOTE  I connected with Bethany Avery on 07/31/20 at  8:10 AM EST by My Chart video at home and verified that I am speaking with the correct person using two identifiers. Provider located at [employer] for [employer] at [place].   I discussed the limitations, risks, security and privacy concerns of performing an evaluation and management service by My Chart video and the availability of in person appointments. I also discussed with the patient that there may be a patient responsible charge related to this service. The patient expressed understanding and agreed to proceed.  Subjective:  Bethany Avery is a 20 y.o. G2P0010 at [redacted]w[redacted]d being followed for ongoing prenatal care.  She is currently monitored for the following issues for this low-risk pregnancy and has Adolescent behavior problem; Adjustment disorder with mixed anxiety and depressed mood; Supervision of other normal pregnancy, antepartum; History of depression; Obesity in pregnancy, antepartum; Marijuana user; Attention deficit hyperactivity disorder; Carrier of spinal muscular atrophy; Elevated BP without diagnosis of hypertension; and Headache in pregnancy, antepartum, second trimester on their problem list.  Patient reports no complaints. Reports fetal movement. Denies any contractions, bleeding or leaking of fluid.   The following portions of the patient's history were reviewed and updated as appropriate: allergies, current medications, past family history, past medical history, past social history, past surgical history and problem list.   Objective:   General:  Alert, oriented and cooperative.   Mental Status: Normal mood and affect perceived. Normal judgment and thought content.  Rest of physical exam deferred due to type of encounter  BP (!) 126/57   Pulse 89   Wt 206 lb (93.4 kg)   LMP 03/07/2020   BMI 34.28 kg/m  **Done by patient's own at home BP  cuff and scale  Assessment and Plan:  Pregnancy: G2P0010 at [redacted]w[redacted]d  1. Supervision of other normal pregnancy, antepartum - Discussed timing of OB visits. - Anticipatory guidance for MyChart visit next  2. Chlamydia infection affecting pregnancy, antepartum - Rx for Azithromycin (ZITHROMAX) 500 MG tablet; Take 2 tablets (1,000 mg total) by mouth once for 1 dose.  Dispense: 2 tablet; Refill: 0  Preterm labor symptoms and general obstetric precautions including but not limited to vaginal bleeding, contractions, leaking of fluid and fetal movement were reviewed in detail with the patient.  I discussed the assessment and treatment plan with the patient. The patient was provided an opportunity to ask questions and all were answered. The patient agreed with the plan and demonstrated an understanding of the instructions. The patient was advised to call back or seek an in-person office evaluation/go to MAU at [facility] for any urgent or concerning symptoms. Please refer to After Visit Summary for other counseling recommendations.   I provided 5 minutes of non-face-to-face time during this encounter. There was 5 minutes of chart review time spent prior to this encounter. Total time spent = 10 minutes.  Return in about 4 weeks (around 08/28/2020) for Return OB - My Chart video.  Future Appointments  Date Time Provider Department Center  08/21/2020  1:00 PM [facility] NURSE [facility]  08/21/2020  1:15 PM [facility] US[facility]  08/29/2020  8:50 AM Raelyn Mora, CNM [facility] None    Raelyn Mora, CNM Center for [employer], [facility]

## 2020-08-05 ENCOUNTER — Inpatient Hospital Stay (HOSPITAL_COMMUNITY)
Admission: AD | Admit: 2020-08-05 | Discharge: 2020-08-06 | Disposition: A | Discharge status: Home / Self Care | Payer: Medicaid Other | Attending: Obstetrics & Gynecology | Admitting: Obstetrics & Gynecology

## 2020-08-05 ENCOUNTER — Other Ambulatory Visit: Payer: Self-pay

## 2020-08-05 ENCOUNTER — Encounter (HOSPITAL_COMMUNITY): Payer: Self-pay | Admitting: Obstetrics & Gynecology

## 2020-08-05 DIAGNOSIS — Z79899 Other long term (current) drug therapy: Secondary | ICD-10-CM | POA: Diagnosis not present

## 2020-08-05 DIAGNOSIS — Z7982 Long term (current) use of aspirin: Secondary | ICD-10-CM | POA: Diagnosis not present

## 2020-08-05 DIAGNOSIS — O26892 Other specified pregnancy related conditions, second trimester: Secondary | ICD-10-CM

## 2020-08-05 DIAGNOSIS — Z3A20 20 weeks gestation of pregnancy: Secondary | ICD-10-CM | POA: Insufficient documentation

## 2020-08-05 DIAGNOSIS — O26899 Other specified pregnancy related conditions, unspecified trimester: Secondary | ICD-10-CM

## 2020-08-05 DIAGNOSIS — R1030 Lower abdominal pain, unspecified: Secondary | ICD-10-CM | POA: Diagnosis not present

## 2020-08-05 DIAGNOSIS — R519 Headache, unspecified: Secondary | ICD-10-CM

## 2020-08-05 DIAGNOSIS — R102 Pelvic and perineal pain: Secondary | ICD-10-CM | POA: Insufficient documentation

## 2020-08-05 MED ORDER — CYCLOBENZAPRINE HCL 5 MG PO TABS
5.0000 mg | ORAL_TABLET | Freq: Three times a day (TID) | ORAL | 0 refills | Status: DC | PRN
Start: 2020-08-05 — End: 2020-09-11

## 2020-08-05 NOTE — MAU Note (Signed)
Pt c/o pelvic pain x 3-4 days. Hurts to walk and change position. Denies any vag discharge or bleeding. Denies any UTI symptoms.

## 2020-08-05 NOTE — MAU Provider Note (Signed)
History     CSN: [id number]  Arrival date and time: 08/05/20 2238   Event Date/Time   First Provider Initiated Contact with Patient 08/05/20 2351      Chief Complaint  Patient presents with  . Abdominal Pain  . Pelvic Pain   HPI Bethany Avery is a 20 y.o. G2P0010 at [redacted]w[redacted]d who presents with lower abdominal pain. She states the pain happens when she moves or walks. She describes the pain as sharp and shooting. She has not tried anything for the pain and does not use a pregnancy support belt.  OB History    Gravida  2   Para      Term      Preterm      AB  1   Living        SAB  1   IAB      Ectopic      Multiple      Live Births              Past Medical History:  Diagnosis Date  . ADHD (attention deficit hyperactivity disorder)   . Gonorrhea 02/02/2018    History reviewed. No pertinent surgical history.  Family History  Problem Relation Age of Onset  . Healthy Mother   . Healthy Father     Social History   Tobacco Use  . Smoking status: Never Smoker  . Smokeless tobacco: Never Used  Vaping Use  . Vaping Use: Never used  Substance Use Topics  . Alcohol use: Not Currently  . Drug use: Not Currently    Types: Marijuana    Comment: used this week    Allergies:  Allergies  Allergen Reactions  . Nickel Rash    Medications Prior to Admission  Medication Sig Dispense Refill Last Dose  . aspirin EC 81 MG tablet Take 1 tablet (81 mg total) by mouth daily. 60 tablet 2 08/05/2020 at Unknown time  . metoCLOPramide (REGLAN) 10 MG tablet Take 1 tablet (10 mg total) by mouth every 8 (eight) hours as needed (take with tylenol for headaches). 30 tablet 0 Past Week at Unknown time  . metroNIDAZOLE (FLAGYL) 500 MG tablet Take 1 tablet (500 mg total) by mouth 2 (two) times daily. 14 tablet 0 08/04/2020 at Unknown time  . ondansetron (ZOFRAN ODT) 4 MG disintegrating tablet Take 1 tablet (4 mg total) by mouth every 8 (eight) hours as needed for  nausea or vomiting. 15 tablet 0 Past Week at Unknown time  . Prenat w/o A Vit-FeFum-FePo-FA (CONCEPT OB) 130-92.4-1 MG CAPS Take 1 capsule by mouth daily. 30 capsule 11 08/05/2020 at Unknown time  . terconazole (TERAZOL 7) 0.4 % vaginal cream Place 1 applicator vaginally at bedtime. 45 g 0 08/04/2020 at Unknown time  . albuterol (VENTOLIN HFA) 108 (90 Base) MCG/ACT inhaler Inhale 1-2 puffs into the lungs every 6 (six) hours as needed for wheezing or shortness of breath. 18 g 0   . Blood Pressure Monitoring (BLOOD PRESSURE MONITOR AUTOMAT) DEVI 1 Device by Does not apply route daily. Automatic blood pressure cuff large size. To monitor blood pressure regularly at home. ICD-10 code:Z34.90 1 each 0   . cyclobenzaprine (FLEXERIL) 5 MG tablet Take 1 tablet (5 mg total) by mouth 3 (three) times daily as needed (for headachec). 20 tablet 0   . Doxylamine-Pyridoxine 10-10 MG TBEC Take 2 tablets by mouth at bedtime. 60 tablet 2   . fluticasone (FLONASE) 50 MCG/ACT nasal spray Place 1-2 sprays  into both nostrils daily for 7 days. 1 g 0   . Misc. Devices (GOJJI WEIGHT SCALE) MISC 1 Device by Does not apply route daily as needed. To weight self daily as needed at home. ICD-10 code: Z34.90 1 each 0     Review of Systems  Constitutional: Negative.  Negative for fatigue and fever.  HENT: Negative.   Respiratory: Negative.  Negative for shortness of breath.   Cardiovascular: Negative.  Negative for chest pain.  Gastrointestinal: Positive for abdominal pain. Negative for constipation, diarrhea, nausea and vomiting.  Genitourinary: Negative.  Negative for dysuria, vaginal bleeding and vaginal discharge.  Neurological: Negative.  Negative for dizziness and headaches.   Physical Exam   Blood pressure 132/70, pulse 96, temperature 98.1 F (36.7 C), resp. rate 18, height 5\' 5"  (1.651 m), weight 95.3 kg, last menstrual period 03/07/2020.  Physical Exam Vitals and nursing note reviewed.  Constitutional:       General: She is not in acute distress.    Appearance: She is well-developed.  HENT:     Head: Normocephalic.  Eyes:     Pupils: Pupils are equal, round, and reactive to light.  Cardiovascular:     Rate and Rhythm: Normal rate and regular rhythm.     Heart sounds: Normal heart sounds.  Pulmonary:     Effort: Pulmonary effort is normal. No respiratory distress.     Breath sounds: Normal breath sounds.  Abdominal:     General: Bowel sounds are normal. There is no distension.     Palpations: Abdomen is soft.     Tenderness: There is no abdominal tenderness.  Skin:    General: Skin is warm and dry.  Neurological:     Mental Status: She is alert and oriented to person, place, and time.  Psychiatric:        Behavior: Behavior normal.        Thought Content: Thought content normal.        Judgment: Judgment normal.    FHT: 156 bpm  Cervix: closed/thick/posterior  MAU Course  Procedures Results for orders placed or performed during the hospital encounter of 08/05/20 (from the past 24 hour(s))  Urinalysis, Routine w reflex microscopic Urine, Clean Catch     Status: Abnormal   Collection Time: 08/05/20 11:04 PM  Result Value Ref Range   Color, Urine YELLOW YELLOW   APPearance CLEAR CLEAR   Specific Gravity, Urine 1.017 1.005 - 1.030   pH 6.0 5.0 - 8.0   Glucose, UA NEGATIVE NEGATIVE mg/dL   Hgb urine dipstick NEGATIVE NEGATIVE   Bilirubin Urine NEGATIVE NEGATIVE   Ketones, ur NEGATIVE NEGATIVE mg/dL   Protein, ur NEGATIVE NEGATIVE mg/dL   Nitrite NEGATIVE NEGATIVE   Leukocytes,Ua LARGE (A) NEGATIVE   RBC / HPF 0-5 0 - 5 RBC/hpf   WBC, UA 6-10 0 - 5 WBC/hpf   Bacteria, UA FEW (A) NONE SEEN   Squamous Epithelial / LPF 0-5 0 - 5   Mucus PRESENT    MDM UA, UC  Offered tylenol and patient declined. Long discussion with patient about pregnancy support belts and proper hydration in pregnancy.   Assessment and Plan   1. Pain of round ligament affecting pregnancy, antepartum    2. [redacted] weeks gestation of pregnancy    -Discharge home in stable condition -Abdominal pain precautions discussed -Patient advised to follow-up with OB as scheduled for prenatal care -Patient may return to MAU as needed or if her condition were to change or worsen  Rolm Bookbinder CNM 08/05/2020, 11:52 PM

## 2020-08-06 LAB — URINALYSIS, ROUTINE W REFLEX MICROSCOPIC
Bilirubin Urine: NEGATIVE
Glucose, UA: NEGATIVE mg/dL
Hgb urine dipstick: NEGATIVE
Ketones, ur: NEGATIVE mg/dL
Nitrite: NEGATIVE
Protein, ur: NEGATIVE mg/dL
Specific Gravity, Urine: 1.017 (ref 1.005–1.030)
pH: 6 (ref 5.0–8.0)

## 2020-08-06 NOTE — Discharge Instructions (Signed)

## 2020-08-07 LAB — CULTURE, OB URINE: Culture: NO GROWTH

## 2020-08-12 ENCOUNTER — Encounter: Payer: Self-pay | Admitting: *Deleted

## 2020-08-21 ENCOUNTER — Ambulatory Visit: Payer: Medicaid Other | Admitting: *Deleted

## 2020-08-21 ENCOUNTER — Encounter: Payer: Self-pay | Admitting: *Deleted

## 2020-08-21 ENCOUNTER — Ambulatory Visit: Payer: Medicaid Other | Attending: Obstetrics and Gynecology

## 2020-08-21 ENCOUNTER — Other Ambulatory Visit: Payer: Self-pay

## 2020-08-21 DIAGNOSIS — E669 Obesity, unspecified: Secondary | ICD-10-CM | POA: Diagnosis not present

## 2020-08-21 DIAGNOSIS — Z363 Encounter for antenatal screening for malformations: Secondary | ICD-10-CM

## 2020-08-21 DIAGNOSIS — Z3480 Encounter for supervision of other normal pregnancy, unspecified trimester: Secondary | ICD-10-CM | POA: Insufficient documentation

## 2020-08-21 DIAGNOSIS — Z3A23 23 weeks gestation of pregnancy: Secondary | ICD-10-CM | POA: Insufficient documentation

## 2020-08-21 DIAGNOSIS — Z362 Encounter for other antenatal screening follow-up: Secondary | ICD-10-CM | POA: Insufficient documentation

## 2020-08-21 DIAGNOSIS — Z148 Genetic carrier of other disease: Secondary | ICD-10-CM | POA: Diagnosis not present

## 2020-08-21 DIAGNOSIS — O99212 Obesity complicating pregnancy, second trimester: Secondary | ICD-10-CM | POA: Diagnosis not present

## 2020-08-26 ENCOUNTER — Other Ambulatory Visit: Payer: Self-pay

## 2020-08-26 ENCOUNTER — Inpatient Hospital Stay (HOSPITAL_COMMUNITY)
Admission: AD | Admit: 2020-08-26 | Discharge: 2020-08-27 | Disposition: A | Discharge status: Home / Self Care | Payer: Medicaid Other | Attending: Family Medicine | Admitting: Family Medicine

## 2020-08-26 DIAGNOSIS — B3731 Acute candidiasis of vulva and vagina: Secondary | ICD-10-CM

## 2020-08-26 DIAGNOSIS — Z3480 Encounter for supervision of other normal pregnancy, unspecified trimester: Secondary | ICD-10-CM

## 2020-08-26 DIAGNOSIS — O98812 Other maternal infectious and parasitic diseases complicating pregnancy, second trimester: Secondary | ICD-10-CM | POA: Insufficient documentation

## 2020-08-26 DIAGNOSIS — Z3689 Encounter for other specified antenatal screening: Secondary | ICD-10-CM

## 2020-08-26 DIAGNOSIS — O26892 Other specified pregnancy related conditions, second trimester: Secondary | ICD-10-CM | POA: Insufficient documentation

## 2020-08-26 DIAGNOSIS — B373 Candidiasis of vulva and vagina: Secondary | ICD-10-CM | POA: Insufficient documentation

## 2020-08-26 DIAGNOSIS — S3991XA Unspecified injury of abdomen, initial encounter: Secondary | ICD-10-CM

## 2020-08-26 DIAGNOSIS — Y92511 Restaurant or cafe as the place of occurrence of the external cause: Secondary | ICD-10-CM | POA: Insufficient documentation

## 2020-08-26 DIAGNOSIS — Z3A23 23 weeks gestation of pregnancy: Secondary | ICD-10-CM | POA: Insufficient documentation

## 2020-08-26 DIAGNOSIS — Z7982 Long term (current) use of aspirin: Secondary | ICD-10-CM | POA: Insufficient documentation

## 2020-08-26 NOTE — MAU Note (Signed)
PT SAYS WAS IN [facility] AT 5PM- BOYFRIEND- BABY'S DADDY-  CAME IN WITH ANOTHER GIRL.  HE STARTED PUNCHING AND HITTING PT THEN GIRLFRIEND STARTED HITTING PT - IN [place] . CALLED POLICE- GOING TO PRESS CHARGES TOMORROW.  HAS NOT FELT BABY MOVE SINCE THEN - [facility]- IN TRIAGE - [address] [facility]- WITH [place] - WAS SEEN - 3-9- VIDEO

## 2020-08-27 ENCOUNTER — Encounter (HOSPITAL_COMMUNITY): Payer: Self-pay | Admitting: Family Medicine

## 2020-08-27 ENCOUNTER — Inpatient Hospital Stay (HOSPITAL_BASED_OUTPATIENT_CLINIC_OR_DEPARTMENT_OTHER): Payer: Medicaid Other

## 2020-08-27 DIAGNOSIS — O2692 Pregnancy related conditions, unspecified, second trimester: Secondary | ICD-10-CM

## 2020-08-27 DIAGNOSIS — O99212 Obesity complicating pregnancy, second trimester: Secondary | ICD-10-CM

## 2020-08-27 DIAGNOSIS — Z3A23 23 weeks gestation of pregnancy: Secondary | ICD-10-CM

## 2020-08-27 DIAGNOSIS — S3991XA Unspecified injury of abdomen, initial encounter: Secondary | ICD-10-CM

## 2020-08-27 DIAGNOSIS — O9A212 Injury, poisoning and certain other consequences of external causes complicating pregnancy, second trimester: Secondary | ICD-10-CM

## 2020-08-27 DIAGNOSIS — R109 Unspecified abdominal pain: Secondary | ICD-10-CM

## 2020-08-27 DIAGNOSIS — O98812 Other maternal infectious and parasitic diseases complicating pregnancy, second trimester: Secondary | ICD-10-CM | POA: Diagnosis not present

## 2020-08-27 DIAGNOSIS — B373 Candidiasis of vulva and vagina: Secondary | ICD-10-CM | POA: Diagnosis not present

## 2020-08-27 DIAGNOSIS — Y939 Activity, unspecified: Secondary | ICD-10-CM

## 2020-08-27 DIAGNOSIS — Y92511 Restaurant or cafe as the place of occurrence of the external cause: Secondary | ICD-10-CM

## 2020-08-27 DIAGNOSIS — Z148 Genetic carrier of other disease: Secondary | ICD-10-CM

## 2020-08-27 DIAGNOSIS — Z7982 Long term (current) use of aspirin: Secondary | ICD-10-CM | POA: Diagnosis not present

## 2020-08-27 DIAGNOSIS — E669 Obesity, unspecified: Secondary | ICD-10-CM

## 2020-08-27 DIAGNOSIS — O23592 Infection of other part of genital tract in pregnancy, second trimester: Secondary | ICD-10-CM | POA: Diagnosis not present

## 2020-08-27 DIAGNOSIS — O26892 Other specified pregnancy related conditions, second trimester: Secondary | ICD-10-CM | POA: Diagnosis present

## 2020-08-27 LAB — WET PREP, GENITAL
Clue Cells Wet Prep HPF POC: NONE SEEN
Sperm: NONE SEEN
Trich, Wet Prep: NONE SEEN

## 2020-08-27 LAB — GC/CHLAMYDIA PROBE AMP (~~LOC~~) NOT AT ARMC
Chlamydia: NEGATIVE
Comment: NEGATIVE
Comment: NORMAL
Neisseria Gonorrhea: NEGATIVE

## 2020-08-27 LAB — HIV ANTIBODY (ROUTINE TESTING W REFLEX): HIV Screen 4th Generation wRfx: NONREACTIVE

## 2020-08-27 LAB — RPR: RPR Ser Ql: NONREACTIVE

## 2020-08-27 MED ORDER — TERCONAZOLE 0.4 % VA CREA: 1.0000 | TOPICAL_CREAM | Freq: Every day | VAGINAL | 0 refills | Status: DC
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
## 2020-08-27 NOTE — Discharge Instructions (Signed)
Return to MAU if the pain in your side gets worse or you see any vaginal bleeding.

## 2020-08-29 ENCOUNTER — Encounter: Payer: Self-pay | Admitting: Obstetrics and Gynecology

## 2020-08-29 ENCOUNTER — Telehealth (INDEPENDENT_AMBULATORY_CARE_PROVIDER_SITE_OTHER): Payer: Medicaid Other | Admitting: Obstetrics and Gynecology

## 2020-08-29 VITALS — BP 127/72 | HR 81 | Wt 214.0 lb

## 2020-08-29 DIAGNOSIS — Z3482 Encounter for supervision of other normal pregnancy, second trimester: Secondary | ICD-10-CM

## 2020-08-29 DIAGNOSIS — Z3A24 24 weeks gestation of pregnancy: Secondary | ICD-10-CM

## 2020-08-29 DIAGNOSIS — Z3480 Encounter for supervision of other normal pregnancy, unspecified trimester: Secondary | ICD-10-CM

## 2020-08-29 NOTE — Patient Instructions (Signed)
Oral Glucose Tolerance Test During Pregnancy Why am I having this test? The oral glucose tolerance test (OGTT) is done to check how your body processes blood sugar (glucose). This is one of several tests used to diagnose diabetes that develops during pregnancy (gestational diabetes mellitus). Gestational diabetes is a short-term form of diabetes that some women develop while they are pregnant. It usually occurs during the second trimester of pregnancy and goes away after delivery. Testing, or screening, for gestational diabetes usually occurs at weeks 24-28 of pregnancy. You may have the OGTT test after having a 1-hour glucose screening test if the results from that test indicate that you may have gestational diabetes. This test may also be needed if:  You have a history of gestational diabetes.  There is a history of giving birth to very large babies or of losing pregnancies (having stillbirths).  You have signs and symptoms of diabetes, such as: ? Changes in your eyesight. ? Tingling or numbness in your hands or feet. ? Changes in hunger, thirst, and urination, and these are not explained by your pregnancy. What is being tested? This test measures the amount of glucose in your blood at different times during a period of 3 hours. This shows how well your body can process glucose. What kind of sample is taken? Blood samples are required for this test. They are usually collected by inserting a needle into a blood vessel.   How do I prepare for this test?  For 3 days before your test, eat normally. Have plenty of carbohydrate-rich foods.  Follow instructions from your health care provider about: ? Eating or drinking restrictions on the day of the test. You may be asked not to eat or drink anything other than water (to fast) starting 8-10 hours before the test. ? Changing or stopping your regular medicines. Some medicines may interfere with this test. Tell a health care provider about:  All  medicines you are taking, including vitamins, herbs, eye drops, creams, and over-the-counter medicines.  Any blood disorders you have.  Any surgeries you have had.  Any medical conditions you have. What happens during the test? First, your blood glucose will be measured. This is referred to as your fasting blood glucose because you fasted before the test. Then, you will drink a glucose solution that contains a certain amount of glucose. Your blood glucose will be measured again 1, 2, and 3 hours after you drink the solution. This test takes about 3 hours to complete. You will need to stay at the testing location during this time. During the testing period:  Do not eat or drink anything other than the glucose solution.  Do not exercise.  Do not use any products that contain nicotine or tobacco, such as cigarettes, e-cigarettes, and chewing tobacco. These can affect your test results. If you need help quitting, ask your health care provider. The testing procedure may vary among health care providers and hospitals. How are the results reported? Your results will be reported as milligrams of glucose per deciliter of blood (mg/dL) or millimoles per liter (mmol/L). There is more than one source for screening and diagnosis reference values used to diagnose gestational diabetes. Your health care provider will compare your results to normal values that were established after testing a large group of people (reference values). Reference values may vary among labs and hospitals. For this test (Carpenter-Coustan), reference values are:  Fasting: 95 mg/dL (5.3 mmol/L).  1 hour: 180 mg/dL (10.0 mmol/L).  2 hour:   155 mg/dL (8.6 mmol/L).  3 hour: 140 mg/dL (7.8 mmol/L). What do the results mean? Results below the reference values are considered normal. If two or more of your blood glucose levels are at or above the reference values, you may be diagnosed with gestational diabetes. If only one level is  high, your health care provider may suggest repeat testing or other tests to confirm a diagnosis. Talk with your health care provider about what your results mean. Questions to ask your health care provider Ask your health care provider, or the department that is doing the test:  When will my results be ready?  How will I get my results?  What are my treatment options?  What other tests do I need?  What are my next steps? Summary  The oral glucose tolerance test (OGTT) is one of several tests used to diagnose diabetes that develops during pregnancy (gestational diabetes mellitus). Gestational diabetes is a short-term form of diabetes that some women develop while they are pregnant.  You may have the OGTT test after having a 1-hour glucose screening test if the results from that test show that you may have gestational diabetes. You may also have this test if you have any symptoms or risk factors for this type of diabetes.  Talk with your health care provider about what your results mean. This information is not intended to replace advice given to you by your health care provider. Make sure you discuss any questions you have with your health care provider. Document Revised: 10/19/2019 Document Reviewed: 10/19/2019 Elsevier Patient Education  2021 Elsevier Inc.  

## 2020-08-29 NOTE — Progress Notes (Signed)
   MY CHART VIDEO VIRTUAL OBSTETRICS VISIT ENCOUNTER NOTE  I connected with Bethany Avery on 08/29/20 at  8:50 AM EDT by My Chart video at home and verified that I am speaking with the correct person using two identifiers. Provider located at [facility] for [employer] at [place].   I discussed the limitations, risks, security and privacy concerns of performing an evaluation and management service by My Chart video and the availability of in person appointments. I also discussed with the patient that there may be a patient responsible charge related to this service. The patient expressed understanding and agreed to proceed.  Subjective:  Bethany Avery is a 20 y.o. G2P0010 at [redacted]w[redacted]d being followed for ongoing prenatal care.  She is currently monitored for the following issues for this low-risk pregnancy and has Adolescent behavior problem; Adjustment disorder with mixed anxiety and depressed mood; Supervision of other normal pregnancy, antepartum; History of depression; Obesity in pregnancy, antepartum; Marijuana user; Attention deficit hyperactivity disorder; Carrier of spinal muscular atrophy; Elevated BP without diagnosis of hypertension; and Headache in pregnancy, antepartum, second trimester on their problem list.  Patient reports no complaints. Reports fetal movement. Denies any contractions, bleeding or leaking of fluid.   The following portions of the patient's history were reviewed and updated as appropriate: allergies, current medications, past family history, past medical history, past social history, past surgical history and problem list.   Objective:   General:  Alert, oriented and cooperative.   Mental Status: Normal mood and affect perceived. Normal judgment and thought content.  Rest of physical exam deferred due to type of encounter  BP 127/72   Pulse 81   Wt 214 lb (97.1 kg)   LMP 03/07/2020   BMI 34.54 kg/m  **Done by patient's own at home BP cuff  and scale  Assessment and Plan:  Pregnancy: G2P0010 at [redacted]w[redacted]d  1. Supervision of other normal pregnancy, antepartum - Anticipatory guidance for 2 hr GTT - advised to fast after midnight without anything to eat or drink (except for water), will have fasting blood drawn, drink the glucola drink (flavor choices: orange, fruit punch or lemon-lime), have a visit with a provider during the first hour of testing, wait in the lab waiting room to have blood drawn at 1 hour and then 2 hours after finishing glucola drink.  2. [redacted] weeks gestation of pregnancy    Preterm labor symptoms and general obstetric precautions including but not limited to vaginal bleeding, contractions, leaking of fluid and fetal movement were reviewed in detail with the patient.  I discussed the assessment and treatment plan with the patient. The patient was provided an opportunity to ask questions and all were answered. The patient agreed with the plan and demonstrated an understanding of the instructions. The patient was advised to call back or seek an in-person office evaluation/go to MAU at [facility] for any urgent or concerning symptoms. Please refer to After Visit Summary for other counseling recommendations.   I provided 10 minutes of non-face-to-face time during this encounter. There was 5 minutes of chart review time spent prior to this encounter. Total time spent = 15 minutes.  Return in about 4 weeks (around 09/26/2020) for Return OB 2hr GTT.   Raelyn Mora, CNM [facility] [employer], [facility]

## 2020-09-06 ENCOUNTER — Inpatient Hospital Stay (HOSPITAL_COMMUNITY)
Admission: AD | Admit: 2020-09-06 | Discharge: 2020-09-06 | Disposition: A | Discharge status: Home / Self Care | Payer: Medicaid Other | Attending: Obstetrics & Gynecology | Admitting: Obstetrics & Gynecology

## 2020-09-06 ENCOUNTER — Other Ambulatory Visit: Payer: Self-pay

## 2020-09-06 ENCOUNTER — Encounter (HOSPITAL_COMMUNITY): Payer: Self-pay | Admitting: Obstetrics & Gynecology

## 2020-09-06 DIAGNOSIS — O26892 Other specified pregnancy related conditions, second trimester: Secondary | ICD-10-CM | POA: Insufficient documentation

## 2020-09-06 DIAGNOSIS — Z3689 Encounter for other specified antenatal screening: Secondary | ICD-10-CM

## 2020-09-06 DIAGNOSIS — O368121 Decreased fetal movements, second trimester, fetus 1: Secondary | ICD-10-CM

## 2020-09-06 DIAGNOSIS — Z7982 Long term (current) use of aspirin: Secondary | ICD-10-CM | POA: Insufficient documentation

## 2020-09-06 DIAGNOSIS — Z3A25 25 weeks gestation of pregnancy: Secondary | ICD-10-CM

## 2020-09-06 DIAGNOSIS — Z79899 Other long term (current) drug therapy: Secondary | ICD-10-CM | POA: Diagnosis not present

## 2020-09-06 DIAGNOSIS — R109 Unspecified abdominal pain: Secondary | ICD-10-CM

## 2020-09-06 DIAGNOSIS — R519 Headache, unspecified: Secondary | ICD-10-CM | POA: Diagnosis not present

## 2020-09-06 DIAGNOSIS — R102 Pelvic and perineal pain unspecified side: Secondary | ICD-10-CM

## 2020-09-06 DIAGNOSIS — O368120 Decreased fetal movements, second trimester, not applicable or unspecified: Secondary | ICD-10-CM

## 2020-09-06 DIAGNOSIS — R059 Cough, unspecified: Secondary | ICD-10-CM

## 2020-09-06 MED ORDER — ACETAMINOPHEN 500 MG PO TABS
1000.0000 mg | ORAL_TABLET | Freq: Once | ORAL | Status: AC
  Administered 2020-09-06: 1000 mg via ORAL
  Filled 2020-09-06: qty 2

## 2020-09-06 MED ORDER — CYCLOBENZAPRINE HCL 5 MG PO TABS
10.0000 mg | ORAL_TABLET | Freq: Once | ORAL | Status: AC
  Administered 2020-09-06: 10 mg via ORAL
  Filled 2020-09-06: qty 2

## 2020-09-06 NOTE — Discharge Instructions (Signed)
Eating Plan for Pregnant Women While you are pregnant, your body requires additional nutrition to help support your growing baby. You also have a higher need for some vitamins and minerals, such as folic acid, calcium, iron, and vitamin D. Eating a healthy, well-balanced diet is very important for your health and your baby's health. Your need for extra calories varies over the course of your pregnancy. Pregnancy is divided into three trimesters, with each trimester lasting 3 months. For most women, it is recommended to consume:  150 extra calories a day during the first trimester.  300 extra calories a day during the second trimester.  300 extra calories a day during the third trimester. What are tips for following this plan? Cooking  Practice good food safety and cleanliness. Wash your hands before you eat and after you prepare raw meat. Wash all fruits and vegetables well before peeling or eating. Taking these actions can help to prevent foodborne illnesses that can be very dangerous to your baby, such as listeriosis. Ask your health care provider for more information about listeriosis.  Make sure that all meats, poultry, and eggs are cooked to food-safe temperatures or "well-done." Meal planning  Eat a variety of foods (especially fruits and vegetables) to get a full range of vitamins and minerals.  Two or more servings of fish are recommended each week in order to get the most benefits from omega-3 fatty acids that are found in seafood. Choose fish that are lower in mercury, such as salmon and pollock.  Limit your overall intake of foods that have "empty calories." These are foods that have little nutritional value, such as sweets, desserts, candies, and sugar-sweetened beverages.  Drinks that contain caffeine are okay to drink, but it is better to avoid caffeine. Keep your total caffeine intake to less than 200 mg each day (which is 12 oz or 355 mL of coffee, tea, or soda) or the limit as  told by your health care provider.   General information  Do not try to lose weight or go on a diet during pregnancy.  Take a prenatal vitamin to help meet your additional vitamin and mineral needs during pregnancy, specifically for folic acid, iron, calcium, and vitamin D.  Remember to stay active. Ask your health care provider what types of exercise and activities are safe for you. What does 150 extra calories look like? Healthy options that provide 150 extra calories each day could be any of the following:  6-8 oz (170-227 g) plain low-fat yogurt with  cup (70 g) berries.  1 apple with 2 tsp (11 g) peanut butter.  Cut-up vegetables with  cup (60 g) hummus.  8 fl oz (237 mL) low-fat chocolate milk.  1 stick of string cheese with 1 medium orange.  1 peanut butter and jelly sandwich that is made with one slice of whole-wheat bread and 1 tsp (5 g) of peanut butter. For 300 extra calories, you could eat two of these healthy options each day. What is a healthy amount of weight to gain? The right amount of weight gain for you is based on your BMI (body mass index) before you became pregnant.  If your BMI was less than 18 (underweight), you should gain 28-40 lb (13-18 kg).  If your BMI was 18-24.9 (normal), you should gain 25-35 lb (11-16 kg).  If your BMI was 25-29.9 (overweight), you should gain 15-25 lb (7-11 kg).  If your BMI was 30 or greater (obese), you should gain 11-20 lb (  5-9 kg). What if I am having twins or multiples? Generally, if you are carrying twins or multiples:  You may need to eat 300-600 extra calories a day.  The recommended range for total weight gain is 25-54 lb (11-25 kg), depending on your BMI before pregnancy.  Talk with your health care provider to find out about nutritional needs, weight gain, and exercise that is right for you. What foods should I eat? Fruits All fruits. Eat a variety of colors and types of fruit. Remember to wash your fruits well  before peeling or eating. Vegetables All vegetables. Eat a variety of colors and types of vegetables. Remember to wash your vegetables well before peeling or eating. Grains All grains. Choose whole grains, such as whole-wheat bread, oatmeal, or brown rice. Meats and other protein foods Lean meats, including chicken, [place], and lean cuts of beef, veal, or pork. Fish that is higher in omega-3 fatty acids and lower in mercury, such as salmon, herring, mussels, trout, sardines, pollock, shrimp, crab, and lobster. Tofu. Tempeh. Beans. Eggs. Peanut butter and other nut butters. Dairy Pasteurized milk and milk alternatives, such as almond milk. Pasteurized yogurt and pasteurized cheese. Cottage cheese. Sour cream. Beverages Water. Juices that contain 100% fruit juice or vegetable juice. Caffeine-free teas and decaffeinated coffee. Fats and oils Fats and oils are okay to include in moderation. Sweets and desserts Sweets and desserts are okay to include in moderation. Seasoning and other foods All pasteurized condiments. The items listed above may not be a complete list of foods and beverages you can eat. Contact a dietitian for more information.   What foods should I avoid? Fruits Raw (unpasteurized) fruit juices. Vegetables Unpasteurized vegetable juices. Meats and other protein foods Precooked or cured meat, such as bologna, hot dogs, sausages, or meat loaves. (If you must eat those meats, reheat them until they are steaming hot.) Refrigerated pate, meat spreads from a meat counter, or smoked seafood that is found in the refrigerated section of a store. Raw or undercooked meats, poultry, and eggs. Raw fish, such as sushi or sashimi. Fish that have high mercury content, such as tilefish, shark, swordfish, and king mackerel. Dairy Unpasteurized milk and any foods that have unpasteurized milk in them. Soft cheeses, such as feta, queso blanco, queso fresco, [place], [place], panela, and blue-veined  cheeses (unless they are made with pasteurized milk, which must be stated on the label). Beverages Alcohol. Sugar-sweetened beverages, such as sodas, teas, or energy drinks. Seasoning and other foods Homemade fermented foods and drinks, such as pickles, sauerkraut, or kombucha drinks. (Store-bought pasteurized versions of these are okay.) Salads that are made in a store or deli, such as ham salad, chicken salad, egg salad, tuna salad, and seafood salad. The items listed above may not be a complete list of foods and beverages you should avoid. Contact a dietitian for more information. Where to find more information To calculate the number of calories you need based on your height, weight, and activity level, you can use an online calculator such as:  https://www.hunter.com/ To calculate how much weight you should gain during pregnancy, you can use an online pregnancy weight gain calculator such as:  [employer].es To learn more about eating fish during pregnancy, talk with your health care provider or visit:  [employer] Summary  While you are pregnant, your body requires additional nutrition to help support your growing baby.  Eat a variety of foods, especially fruits and vegetables, to get a full range of vitamins and minerals.  Practice good food safety and cleanliness. Wash your hands before you eat and after you prepare raw meat. Wash all fruits and vegetables well before peeling or eating. Taking these actions can help to prevent foodborne illnesses, such as listeriosis, that can be very dangerous to your baby.  Do not eat raw meat or fish. Do not eat fish that have high mercury content, such as tilefish, shark, swordfish, and king mackerel. Do not eat raw (unpasteurized) dairy.  Take a prenatal vitamin to help meet your additional vitamin and mineral needs during pregnancy, specifically for folic acid, iron, calcium, and vitamin D. This information is not intended to replace  advice given to you by your health care provider. Make sure you discuss any questions you have with your health care provider. Document Revised: 12/07/2019 Document Reviewed: 12/07/2019 Elsevier Patient Education  2021 [employer]        Fetal Movement Counts Patient Name: ________________________________________________ Patient Due Date: ____________________  What is a fetal movement count? A fetal movement count is the number of times that you feel your baby move during a certain amount of time. This may also be called a fetal kick count. A fetal movement count is recommended for every pregnant woman. You may be asked to start counting fetal movements as early as week 28 of your pregnancy. Pay attention to when your baby is most active. You may notice your baby's sleep and wake cycles. You may also notice things that make your baby move more. You should do a fetal movement count:  When your baby is normally most active.  At the same time each day. A good time to count movements is while you are resting, after having something to eat and drink. How do I count fetal movements? 1. Find a quiet, comfortable area. Sit, or lie down on your side. 2. Write down the date, the start time and stop time, and the number of movements that you felt between those two times. Take this information with you to your health care visits. 3. Write down your start time when you feel the first movement. 4. Count kicks, flutters, swishes, rolls, and jabs. You should feel at least 10 movements. 5. You may stop counting after you have felt 10 movements, or if you have been counting for 2 hours. Write down the stop time. 6. If you do not feel 10 movements in 2 hours, contact your health care provider for further instructions. Your health care provider may want to do additional tests to assess your baby's well-being. Contact a health care provider if:  You feel fewer than 10 movements in 2 hours.  Your baby  is not moving like he or she usually does. Date: ____________ Start time: ____________ Stop time: ____________ Movements: ____________ Date: ____________ Start time: ____________ Stop time: ____________ Movements: ____________ Date: ____________ Start time: ____________ Stop time: ____________ Movements: ____________ Date: ____________ Start time: ____________ Stop time: ____________ Movements: ____________ Date: ____________ Start time: ____________ Stop time: ____________ Movements: ____________ Date: ____________ Start time: ____________ Stop time: ____________ Movements: ____________ Date: ____________ Start time: ____________ Stop time: ____________ Movements: ____________ Date: ____________ Start time: ____________ Stop time: ____________ Movements: ____________ Date: ____________ Start time: ____________ Stop time: ____________ Movements: ____________ This information is not intended to replace advice given to you by your health care provider. Make sure you discuss any questions you have with your health care provider. Document Revised: 12/29/2018 Document Reviewed: 12/29/2018 Elsevier Patient Education  2021 [employer].  Safe Medications in Pregnancy    Acne: Benzoyl Peroxide Salicylic Acid  Backache/Headache: Tylenol: 2 regular strength every 4 hours OR              2 Extra strength every 6 hours  Colds/Coughs/Allergies: Benadryl (alcohol free) 25 mg every 6 hours as needed Breath right strips Claritin Cepacol throat lozenges Chloraseptic throat spray Cold-Eeze- up to three times per day Cough drops, alcohol free Flonase (by prescription only) Guaifenesin Mucinex Robitussin DM (plain only, alcohol free) Saline nasal spray/drops Sudafed (pseudoephedrine) & Actifed ** use only after [redacted] weeks gestation and if you do not have high blood pressure Tylenol Vicks Vaporub Zinc lozenges Zyrtec   Constipation: Colace Ducolax  suppositories Fleet enema Glycerin suppositories Metamucil Milk of magnesia Miralax Senokot Smooth move tea  Diarrhea: Kaopectate Imodium A-D  *NO pepto Bismol  Hemorrhoids: Anusol Anusol HC Preparation H Tucks  Indigestion: Tums Maalox Mylanta Zantac  Pepcid  Insomnia: Benadryl (alcohol free) 25mg  every 6 hours as needed Tylenol PM Unisom, no Gelcaps  Leg Cramps: Tums MagGel  Nausea/Vomiting:  Bonine Dramamine Emetrol Ginger extract Sea bands Meclizine  Nausea medication to take during pregnancy:  Unisom (doxylamine succinate 25 mg tablets) Take one tablet daily at bedtime. If symptoms are not adequately controlled, the dose can be increased to a maximum recommended dose of two tablets daily (1/2 tablet in the morning, 1/2 tablet mid-afternoon and one at bedtime). Vitamin B6 100mg  tablets. Take one tablet twice a day (up to 200 mg per day).  Skin Rashes: Aveeno products Benadryl cream or 25mg  every 6 hours as needed Calamine Lotion 1% cortisone cream  Yeast infection: Gyne-lotrimin 7 Monistat 7   **If taking multiple medications, please check labels to avoid duplicating the same active ingredients **take medication as directed on the label ** Do not exceed 4000 mg of tylenol in 24 hours **Do not take medications that contain aspirin or ibuprofen
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
## 2020-09-06 NOTE — MAU Note (Signed)
Pt reports to MAU stating she has not felt well all week. Pt states she has been tired and has a cough that started last week, which got worse over the past few days. Pt states every time she coughs she feels pain in her lower left abdomen. Pt stated she went to work today and started to feel lightheaded and had blurry vision around 1240. She tried to eat and drink something, but states it did not help. Pt states she has a HA, but denies any blurry vision at this time. Pt denies VB and LOF. She says baby was moving a lot yesterday, but she has not felt him move much today.

## 2020-09-09 ENCOUNTER — Telehealth: Payer: Self-pay | Admitting: *Deleted

## 2020-09-09 NOTE — Telephone Encounter (Signed)
Patient left message with after hours triage line requesting to speak with provider regarding physical therapy referral. Patient was called and scheduled for October 28, 2020. However, patient reported that the pelvic pain is really bad. She was seen at MAU recently Rx for Flexeril was given. Patient is waiting for maternity belt to arrive. Patient stated she has a new job at [employer], work restriction letter given. She stated she would like to go to maternity leave now. Advised patient to contact her human resource office to fax over FMLA forms to be completed. Will forward to provider to see if there is another facility to send referral for an appointment sooner than June.   Clovis Pu, RN

## 2020-09-11 ENCOUNTER — Other Ambulatory Visit: Payer: Self-pay | Admitting: Obstetrics and Gynecology

## 2020-09-11 DIAGNOSIS — R519 Headache, unspecified: Secondary | ICD-10-CM

## 2020-09-11 MED ORDER — CYCLOBENZAPRINE HCL 10 MG PO TABS: 10.0000 mg | ORAL_TABLET | Freq: Three times a day (TID) | ORAL | 0 refills | Status: AC | PRN

## 2020-09-25 ENCOUNTER — Inpatient Hospital Stay (HOSPITAL_COMMUNITY)
Admission: AD | Admit: 2020-09-25 | Discharge: 2020-09-25 | Discharge status: Against Medical Advice | Payer: Medicaid Other | Attending: Obstetrics and Gynecology | Admitting: Obstetrics and Gynecology

## 2020-09-25 ENCOUNTER — Other Ambulatory Visit: Payer: Self-pay
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
## 2020-09-26 ENCOUNTER — Ambulatory Visit (INDEPENDENT_AMBULATORY_CARE_PROVIDER_SITE_OTHER): Payer: Medicaid Other | Admitting: Obstetrics and Gynecology

## 2020-09-26 ENCOUNTER — Encounter: Payer: Self-pay | Admitting: Obstetrics and Gynecology

## 2020-09-26 VITALS — BP 110/72 | HR 111 | Temp 97.9°F | Wt 216.2 lb

## 2020-09-26 DIAGNOSIS — Z3483 Encounter for supervision of other normal pregnancy, third trimester: Secondary | ICD-10-CM

## 2020-09-26 DIAGNOSIS — Z23 Encounter for immunization: Secondary | ICD-10-CM

## 2020-09-26 DIAGNOSIS — Z3A28 28 weeks gestation of pregnancy: Secondary | ICD-10-CM | POA: Diagnosis not present

## 2020-09-26 DIAGNOSIS — Z3480 Encounter for supervision of other normal pregnancy, unspecified trimester: Secondary | ICD-10-CM

## 2020-09-26 NOTE — Patient Instructions (Signed)
Fetal Movement Counts Patient Name: ________________________________________________ Patient Due Date: ____________________  What is a fetal movement count? A fetal movement count is the number of times that you feel your baby move during a certain amount of time. This may also be called a fetal kick count. A fetal movement count is recommended for every pregnant woman. You may be asked to start counting fetal movements as early as week 28 of your pregnancy. Pay attention to when your baby is most active. You may notice your baby's sleep and wake cycles. You may also notice things that make your baby move more. You should do a fetal movement count:  When your baby is normally most active.  At the same time each day. A good time to count movements is while you are resting, after having something to eat and drink. How do I count fetal movements? 1. Find a quiet, comfortable area. Sit, or lie down on your side. 2. Write down the date, the start time and stop time, and the number of movements that you felt between those two times. Take this information with you to your health care visits. 3. Write down your start time when you feel the first movement. 4. Count kicks, flutters, swishes, rolls, and jabs. You should feel at least 10 movements. 5. You may stop counting after you have felt 10 movements, or if you have been counting for 2 hours. Write down the stop time. 6. If you do not feel 10 movements in 2 hours, contact your health care provider for further instructions. Your health care provider may want to do additional tests to assess your baby's well-being. Contact a health care provider if:  You feel fewer than 10 movements in 2 hours.  Your baby is not moving like he or she usually does. Date: ____________ Start time: ____________ Stop time: ____________ Movements: ____________ Date: ____________ Start time: ____________ Stop time: ____________ Movements: ____________ Date: ____________  Start time: ____________ Stop time: ____________ Movements: ____________ Date: ____________ Start time: ____________ Stop time: ____________ Movements: ____________ Date: ____________ Start time: ____________ Stop time: ____________ Movements: ____________ Date: ____________ Start time: ____________ Stop time: ____________ Movements: ____________ Date: ____________ Start time: ____________ Stop time: ____________ Movements: ____________ Date: ____________ Start time: ____________ Stop time: ____________ Movements: ____________ Date: ____________ Start time: ____________ Stop time: ____________ Movements: ____________ This information is not intended to replace advice given to you by your health care provider. Make sure you discuss any questions you have with your health care provider. Document Revised: 12/29/2018 Document Reviewed: 12/29/2018 Elsevier Patient Education  2021 [employer] Iron-Rich Diet  Iron is a mineral that helps your body to produce hemoglobin. Hemoglobin is a protein in red blood cells that carries oxygen to your body's tissues. Eating too little iron may cause you to feel weak and tired, and it can increase your risk of infection. Iron is naturally found in many foods, and many foods have iron added to them (iron-fortified foods). You may need to follow an iron-rich diet if you do not have enough iron in your body due to certain medical conditions. The amount of iron that you need each day depends on your age, your sex, and any medical conditions you have. Follow instructions from your health care provider or a diet and nutrition specialist (dietitian) about how much iron you should eat each day. What are tips for following this plan? Reading food labels  Check food labels to see how many milligrams (mg) of iron are in each  serving. Cooking  Cook foods in pots and pans that are made from iron.  Take these steps to make it easier for your body to absorb iron from certain  foods: ? Soak beans overnight before cooking. ? Soak whole grains overnight and drain them before using. ? Ferment flours before baking, such as by using yeast in bread dough. Meal planning  When you eat foods that contain iron, you should eat them with foods that are high in vitamin C. These include oranges, peppers, tomatoes, potatoes, and mango. Vitamin C helps your body to absorb iron. General information  Take iron supplements only as told by your health care provider. An overdose of iron can be life-threatening. If you were prescribed iron supplements, take them with orange juice or a vitamin C supplement.  When you eat iron-fortified foods or take an iron supplement, you should also eat foods that naturally contain iron, such as meat, poultry, and fish. Eating naturally iron-rich foods helps your body to absorb the iron that is added to other foods or contained in a supplement.  Certain foods and drinks prevent your body from absorbing iron properly. Avoid eating these foods in the same meal as iron-rich foods or with iron supplements. These foods include: ? Coffee, black tea, and red wine. ? Milk, dairy products, and foods that are high in calcium. ? Beans and soybeans. ? Whole grains. What foods should I eat? Fruits Prunes. Raisins. Eat fruits high in vitamin C, such as oranges, grapefruits, and strawberries, alongside iron-rich foods. Vegetables Spinach (cooked). Green peas. Broccoli. Fermented vegetables. Eat vegetables high in vitamin C, such as leafy greens, potatoes, bell peppers, and tomatoes, alongside iron-rich foods. Grains Iron-fortified breakfast cereal. Iron-fortified whole-wheat bread. Enriched rice. Sprouted grains. Meats and other proteins Beef liver. Oysters. Beef. Shrimp. [place]. Chicken. Tuna. Sardines. Chickpeas. Nuts. Tofu. Pumpkin seeds. Beverages Tomato juice. Fresh orange juice. Prune juice. Hibiscus tea. Fortified instant breakfast shakes. Sweets and  desserts Blackstrap molasses. Seasonings and condiments Tahini. Fermented soy sauce. Other foods Wheat germ. The items listed above may not be a complete list of recommended foods and beverages. Contact a dietitian for more information. What foods should I avoid? Grains Whole grains. Bran cereal. Bran flour. Oats. Meats and other proteins Soybeans. Products made from soy protein. Black beans. Lentils. Mung beans. Split peas. Dairy Milk. Cream. Cheese. Yogurt. Cottage cheese. Beverages Coffee. Black tea. Red wine. Sweets and desserts Cocoa. Chocolate. Ice cream. Other foods Basil. Oregano. Large amounts of parsley. The items listed above may not be a complete list of foods and beverages to avoid. Contact a dietitian for more information. Summary  Iron is a mineral that helps your body to produce hemoglobin. Hemoglobin is a protein in red blood cells that carries oxygen to your body's tissues.  Iron is naturally found in many foods, and many foods have iron added to them (iron-fortified foods).  When you eat foods that contain iron, you should eat them with foods that are high in vitamin C. Vitamin C helps your body to absorb iron.  Certain foods and drinks prevent your body from absorbing iron properly, such as whole grains and dairy products. You should avoid eating these foods in the same meal as iron-rich foods or with iron supplements. This information is not intended to replace advice given to you by your health care provider. Make sure you discuss any questions you have with your health care provider. Document Revised: 04/23/2017 Document Reviewed: 04/06/2017 Elsevier Patient Education  2021 Elsevier  Inc.  

## 2020-09-26 NOTE — Progress Notes (Signed)
   LOW-RISK PREGNANCY OFFICE VISIT Patient name: Bethany Avery MRN [id number]  Date of birth: 2001/01/29 Chief Complaint:   Routine Prenatal Visit  History of Present Illness:   Bethany Avery is a 20 y.o. G2P0010 female at [redacted]w[redacted]d with an Estimated Date of Delivery: 12/18/20 being seen today for ongoing management of a low-risk pregnancy.  Today she reports pressure and yellow vaginal discharge that is not causing any irritation, itching, burning, or odor. Contractions: Not present. Vag. Bleeding: None.  Movement: Present. denies leaking of fluid. Review of Systems:   Pertinent items are noted in HPI Denies abnormal vaginal discharge w/ itching/odor/irritation, headaches, visual changes, shortness of breath, chest pain, abdominal pain, severe nausea/vomiting, or problems with urination or bowel movements unless otherwise stated above. Pertinent History Reviewed:  Reviewed past medical,surgical, social, obstetrical and family history.  Reviewed problem list, medications and allergies. Physical Assessment:   Vitals:   09/26/20 0837 09/26/20 0933  BP: 98/67 110/72  Pulse: (!) 111   Temp: 97.9 F (36.6 C)   Weight: 216 lb 3.2 oz (98.1 kg)   Body mass index is 34.9 kg/m.        Physical Examination:   General appearance: Well appearing, and in no distress  Mental status: Alert, oriented to person, place, and time  Skin: Warm & dry  Cardiovascular: Normal heart rate noted  Respiratory: Normal respiratory effort, no distress  Abdomen: Soft, gravid, nontender  Pelvic: Cervical exam deferred         Extremities: Edema: None  Fetal Status: Fetal Heart Rate (bpm): 154   Movement: Present    No results found for this or any previous visit (from the past 24 hour(s)).  Assessment & Plan:  1) Low-risk pregnancy G2P0010 at [redacted]w[redacted]d with an Estimated Date of Delivery: 12/18/20   2) Supervision of other normal pregnancy, antepartum - Glucose Tolerance, 2 Hours w/1 Hour,  - HIV  Antibody (routine testing w rflx),  - RPR,  - CBC,  - Tdap vaccine greater than or equal to 7yo IM  3) [redacted] weeks gestation of pregnancy     Meds: No orders of the defined types were placed in this encounter.  Labs/procedures today: none  Plan:  Continue routine obstetrical care   Reviewed: Preterm labor symptoms and general obstetric precautions including but not limited to vaginal bleeding, contractions, leaking of fluid and fetal movement were reviewed in detail with the patient.  All questions were answered. Has home bp cuff.  Check bp weekly, let us know if >140/90.   Follow-up: Return in about 4 weeks (around 10/24/2020) for Return OB - My Chart video.  Orders Placed This Encounter  Procedures  . Tdap vaccine greater than or equal to 7yo IM  . Glucose Tolerance, 2 Hours w/1 Hour  . HIV Antibody (routine testing w rflx)  . RPR  . CBC   Raelyn Mora MSN, CNM 09/26/2020 9:27 AM

## 2020-09-27 ENCOUNTER — Other Ambulatory Visit: Payer: Self-pay | Admitting: *Deleted

## 2020-09-27 DIAGNOSIS — O24410 Gestational diabetes mellitus in pregnancy, diet controlled: Secondary | ICD-10-CM

## 2020-09-27 LAB — HIV ANTIBODY (ROUTINE TESTING W REFLEX): HIV Screen 4th Generation wRfx: NONREACTIVE

## 2020-09-27 LAB — GLUCOSE TOLERANCE, 2 HOURS W/ 1HR
Glucose, 1 hour: 155 mg/dL (ref 65–179)
Glucose, 2 hour: 93 mg/dL (ref 65–152)
Glucose, Fasting: 94 mg/dL — ABNORMAL HIGH (ref 65–91)

## 2020-09-27 LAB — CBC
Hematocrit: 33.7 % — ABNORMAL LOW (ref 34.0–46.6)
Hemoglobin: 11.6 g/dL (ref 11.1–15.9)
MCH: 31.5 pg (ref 26.6–33.0)
MCHC: 34.4 g/dL (ref 31.5–35.7)
MCV: 92 fL (ref 79–97)
Platelets: 359 10*3/uL (ref 150–450)
RBC: 3.68 x10E6/uL — ABNORMAL LOW (ref 3.77–5.28)
RDW: 11.9 % (ref 11.7–15.4)
WBC: 10.8 10*3/uL (ref 3.4–10.8)

## 2020-09-27 LAB — RPR: RPR Ser Ql: NONREACTIVE

## 2020-09-28 ENCOUNTER — Encounter: Payer: Self-pay | Admitting: Obstetrics and Gynecology

## 2020-09-28 ENCOUNTER — Other Ambulatory Visit: Payer: Self-pay | Admitting: Obstetrics and Gynecology

## 2020-09-28 DIAGNOSIS — O24419 Gestational diabetes mellitus in pregnancy, unspecified control: Secondary | ICD-10-CM

## 2020-09-28 DIAGNOSIS — O24410 Gestational diabetes mellitus in pregnancy, diet controlled: Secondary | ICD-10-CM

## 2020-09-28 HISTORY — DX: Gestational diabetes mellitus in pregnancy, unspecified control: O24.419

## 2020-09-28 MED ORDER — ACCU-CHEK SOFTCLIX LANCETS MISC: 12 refills | Status: DC

## 2020-09-28 MED ORDER — ACCU-CHEK GUIDE W/DEVICE KIT
1.0000 | PACK | Freq: Four times a day (QID) | 0 refills | Status: DC
Start: 2020-09-28 — End: 2020-10-17

## 2020-09-28 NOTE — Progress Notes (Signed)
Patient notified via My Chart message.

## 2020-09-30 MED ORDER — ACCU-CHEK GUIDE VI STRP: 1.0000 | ORAL_STRIP | Freq: Four times a day (QID) | 12 refills | Status: DC

## 2020-09-30 NOTE — Progress Notes (Signed)
Test strips were not sent to pharmacy on 09/28/20. Strips sent to pharmacy.  Clovis Pu, RN

## 2020-10-02 ENCOUNTER — Inpatient Hospital Stay (HOSPITAL_COMMUNITY)
Admission: AD | Admit: 2020-10-02 | Discharge: 2020-10-02 | Discharge status: Against Medical Advice | Payer: Medicaid Other

## 2020-10-02 ENCOUNTER — Other Ambulatory Visit: Payer: Self-pay

## 2020-10-02 ENCOUNTER — Encounter: Payer: Medicaid Other | Attending: Obstetrics and Gynecology | Admitting: Registered"

## 2020-10-02 DIAGNOSIS — O24410 Gestational diabetes mellitus in pregnancy, diet controlled: Secondary | ICD-10-CM | POA: Insufficient documentation

## 2020-10-02 NOTE — MAU Note (Signed)
Not in lobby

## 2020-10-02 NOTE — MAU Note (Signed)
Pt not in lobby from first attempt to call for triage. Pt left prior to triage.

## 2020-10-03 ENCOUNTER — Encounter: Payer: Self-pay | Admitting: Registered"

## 2020-10-03 NOTE — Progress Notes (Signed)
Patient was seen on 10/02/2020 for Gestational Diabetes self-management class at the [facility]. The following learning objectives were met by the patient during this course:   States the definition of Gestational Diabetes  States why dietary management is important in controlling blood glucose  Describes the effects each nutrient has on blood glucose levels  Demonstrates ability to create a balanced meal plan  Demonstrates carbohydrate counting   States when to check blood glucose levels  Demonstrates proper blood glucose monitoring techniques  States the effect of stress and exercise on blood glucose levels  States the importance of limiting caffeine and abstaining from alcohol and smoking  Blood glucose monitor given:Patient has meter and is checking blood sugar prior to class  Patient instructed to monitor glucose levels: FBS: 60 - <95; 1 hour: <140; 2 hour: <120  Patient received handouts:  Nutrition Diabetes and Pregnancy, including carb counting list  Patient will be seen for follow-up as needed.

## 2020-10-05 ENCOUNTER — Other Ambulatory Visit: Payer: Self-pay | Admitting: Obstetrics and Gynecology

## 2020-10-05 ENCOUNTER — Inpatient Hospital Stay (HOSPITAL_COMMUNITY)
Admission: AD | Admit: 2020-10-05 | Discharge: 2020-10-05 | Disposition: A | Discharge status: Home / Self Care | Payer: Medicaid Other | Attending: Obstetrics & Gynecology | Admitting: Obstetrics & Gynecology

## 2020-10-05 ENCOUNTER — Encounter (HOSPITAL_COMMUNITY): Payer: Self-pay | Admitting: Obstetrics & Gynecology

## 2020-10-05 ENCOUNTER — Telehealth: Payer: Self-pay | Admitting: Obstetrics and Gynecology

## 2020-10-05 ENCOUNTER — Other Ambulatory Visit: Payer: Self-pay

## 2020-10-05 DIAGNOSIS — O99891 Other specified diseases and conditions complicating pregnancy: Secondary | ICD-10-CM | POA: Insufficient documentation

## 2020-10-05 DIAGNOSIS — O24410 Gestational diabetes mellitus in pregnancy, diet controlled: Secondary | ICD-10-CM | POA: Diagnosis not present

## 2020-10-05 DIAGNOSIS — Z3A29 29 weeks gestation of pregnancy: Secondary | ICD-10-CM | POA: Diagnosis not present

## 2020-10-05 DIAGNOSIS — F1290 Cannabis use, unspecified, uncomplicated: Secondary | ICD-10-CM | POA: Insufficient documentation

## 2020-10-05 DIAGNOSIS — O26893 Other specified pregnancy related conditions, third trimester: Secondary | ICD-10-CM

## 2020-10-05 DIAGNOSIS — B9689 Other specified bacterial agents as the cause of diseases classified elsewhere: Secondary | ICD-10-CM

## 2020-10-05 DIAGNOSIS — R197 Diarrhea, unspecified: Secondary | ICD-10-CM | POA: Diagnosis not present

## 2020-10-05 DIAGNOSIS — O26899 Other specified pregnancy related conditions, unspecified trimester: Secondary | ICD-10-CM

## 2020-10-05 DIAGNOSIS — Z3A19 19 weeks gestation of pregnancy: Secondary | ICD-10-CM | POA: Diagnosis not present

## 2020-10-05 DIAGNOSIS — O99323 Drug use complicating pregnancy, third trimester: Secondary | ICD-10-CM | POA: Insufficient documentation

## 2020-10-05 LAB — GLUCOSE, CAPILLARY: Glucose-Capillary: 81 mg/dL (ref 70–99)

## 2020-10-05 LAB — URINALYSIS, ROUTINE W REFLEX MICROSCOPIC
Bilirubin Urine: NEGATIVE
Glucose, UA: NEGATIVE mg/dL
Hgb urine dipstick: NEGATIVE
Ketones, ur: NEGATIVE mg/dL
Nitrite: NEGATIVE
Protein, ur: NEGATIVE mg/dL
Specific Gravity, Urine: 1.013 (ref 1.005–1.030)
pH: 8 (ref 5.0–8.0)

## 2020-10-05 LAB — WET PREP, GENITAL
Sperm: NONE SEEN
Trich, Wet Prep: NONE SEEN
Yeast Wet Prep HPF POC: NONE SEEN

## 2020-10-05 MED ORDER — METRONIDAZOLE 500 MG PO TABS: 500.0000 mg | ORAL_TABLET | Freq: Two times a day (BID) | ORAL | 0 refills | Status: DC

## 2020-10-05 NOTE — MAU Provider Note (Signed)
History     CSN: [id number]  Arrival date and time: 10/05/20 1301   Event Date/Time   First Provider Initiated Contact with Patient 10/05/20 1438      Chief Complaint  Patient presents with  . Diarrhea  . Hyperglycemia   [name] is a 20 y.o. year old G31P0010 female at [redacted]w[redacted]d weeks gestation who presents to [facility]U reporting several episodes of diarrhea that started yesterday. She denies any nausea, vomiting or fever. She reports a H/A also. She has GDMA1; reporting her FBS this morning was 182. She reports eating chips and dip last night and BS was 115 2 hrs after. She denies eating anything else after that snack. She did eat a "small piece of steak" after taking FBS at around 1000 this morning. She also requests STI testing, because she has been "real irritated down there." She denies UC's, VB or LOF. She reports good (+)FM. She receives [facility] at [place]; next appt is 10/24/2020.   OB History    Gravida  2   Para      Term      Preterm      AB  1   Living        SAB  1   IAB      Ectopic      Multiple      Live Births              Past Medical History:  Diagnosis Date  . ADHD (attention deficit hyperactivity disorder)   . Gonorrhea 02/02/2018    History reviewed. No pertinent surgical history.  Family History  Problem Relation Age of Onset  . Healthy Mother   . Healthy Father     Social History   Tobacco Use  . Smoking status: Never Smoker  . Smokeless tobacco: Never Used  Vaping Use  . Vaping Use: Never used  Substance Use Topics  . Alcohol use: Not Currently  . Drug use: Not Currently    Types: Marijuana    Comment: used this week    Allergies:  Allergies  Allergen Reactions  . Nickel Rash    No medications prior to admission.    Review of Systems  Constitutional: Negative.   HENT: Negative.   Eyes: Negative.   Respiratory: Negative.   Cardiovascular: Negative.   Gastrointestinal: Positive for diarrhea.   Endocrine: Negative.   Genitourinary: Positive for pelvic pain (+) BH ctxs.  Musculoskeletal: Negative.   Skin: Negative.   Allergic/Immunologic: Negative.   Neurological: Negative.   Hematological: Negative.   Psychiatric/Behavioral: Negative.    Physical Exam   Blood pressure 133/75, pulse 94, temperature 98.1 F (36.7 C), temperature source Oral, resp. rate 17, last menstrual period 03/07/2020, SpO2 100 %.  Physical Exam Vitals and nursing note reviewed.  Constitutional:      Appearance: Normal appearance. She is obese.  Cardiovascular:     Rate and Rhythm: Normal rate.  Abdominal:     Palpations: Abdomen is soft.  Genitourinary:    Comments: Not indicated Musculoskeletal:        General: Normal range of motion.  Skin:    General: Skin is warm and dry.  Neurological:     Mental Status: She is alert and oriented to person, place, and time.  Psychiatric:        Mood and Affect: Mood normal.        Behavior: Behavior normal.        Thought Content: Thought content normal.  Judgment: Judgment normal.   REACTIVE NST - FHR: 145 bpm / moderate variability / accels present / decels absent / TOCO: none  MAU Course  Procedures  MDM CCUA Random CBG Wet Prep GC/CT -- Results pending   Results for orders placed or performed during the hospital encounter of 10/05/20 (from the past 24 hour(s))  Urinalysis, Routine w reflex microscopic Urine, Clean Catch     Status: Abnormal   Collection Time: 10/05/20  1:31 PM  Result Value Ref Range   Color, Urine YELLOW YELLOW   APPearance HAZY (A) CLEAR   Specific Gravity, Urine 1.013 1.005 - 1.030   pH 8.0 5.0 - 8.0   Glucose, UA NEGATIVE NEGATIVE mg/dL   Hgb urine dipstick NEGATIVE NEGATIVE   Bilirubin Urine NEGATIVE NEGATIVE   Ketones, ur NEGATIVE NEGATIVE mg/dL   Protein, ur NEGATIVE NEGATIVE mg/dL   Nitrite NEGATIVE NEGATIVE   Leukocytes,Ua LARGE (A) NEGATIVE   RBC / HPF 0-5 0 - 5 RBC/hpf   WBC, UA 0-5 0 - 5 WBC/hpf    Bacteria, UA RARE (A) NONE SEEN   Squamous Epithelial / LPF 0-5 0 - 5   Mucus PRESENT   Glucose, capillary     Status: None   Collection Time: 10/05/20  1:49 PM  Result Value Ref Range   Glucose-Capillary 81 70 - 99 mg/dL  Wet prep, genital     Status: Abnormal   Collection Time: 10/05/20  4:30 PM   Specimen: PATH Cytology Cervicovaginal Ancillary Only  Result Value Ref Range   Yeast Wet Prep HPF POC NONE SEEN NONE SEEN   Trich, Wet Prep NONE SEEN NONE SEEN   Clue Cells Wet Prep HPF POC PRESENT (A) NONE SEEN   WBC, Wet Prep HPF POC MODERATE (A) NONE SEEN   Sperm NONE SEEN     Assessment and Plan  Diarrhea during pregnancy - Advised to allow diarrhea to run its course and stay well-hydrated  [redacted] weeks gestation of pregnancy  Diet controlled gestational diabetes mellitus (GDM) in third trimester - Advised to follow a high protein, low carb diet - Advised to avoid all carbs in diet   - Discharge patient - Keep scheduled appt on 10/24/20 - Patient verbalized an understanding of the plan of care and agrees.   Raelyn Mora, CNM 10/05/2020, 2:38 PM

## 2020-10-05 NOTE — MAU Note (Signed)
Pt reports to mau with c/o several episodes of diarrhea that started yesterday.  Pt denies vomiting or fever at home.   Reports her CBG upon waking this morning was 184.  Pt denies reg ctx, or LOF.  +FM

## 2020-10-05 NOTE — Telephone Encounter (Signed)
Notified of BV results & Rx for Flagyl 500 mg BID x 7 days being transcribed to [facility]. Patient to pick up in the morning.  Raelyn Mora, CNM

## 2020-10-07 ENCOUNTER — Inpatient Hospital Stay (HOSPITAL_COMMUNITY)
Admission: AD | Admit: 2020-10-07 | Discharge: 2020-10-07 | Disposition: A | Discharge status: Home / Self Care | Payer: Medicaid Other | Attending: Obstetrics and Gynecology | Admitting: Obstetrics and Gynecology

## 2020-10-07 ENCOUNTER — Encounter (HOSPITAL_COMMUNITY): Payer: Self-pay | Admitting: Obstetrics and Gynecology

## 2020-10-07 ENCOUNTER — Ambulatory Visit (INDEPENDENT_AMBULATORY_CARE_PROVIDER_SITE_OTHER): Payer: Medicaid Other | Admitting: *Deleted

## 2020-10-07 ENCOUNTER — Other Ambulatory Visit: Payer: Self-pay

## 2020-10-07 DIAGNOSIS — O26893 Other specified pregnancy related conditions, third trimester: Secondary | ICD-10-CM | POA: Insufficient documentation

## 2020-10-07 DIAGNOSIS — Z7982 Long term (current) use of aspirin: Secondary | ICD-10-CM | POA: Diagnosis not present

## 2020-10-07 DIAGNOSIS — Z79899 Other long term (current) drug therapy: Secondary | ICD-10-CM | POA: Diagnosis not present

## 2020-10-07 DIAGNOSIS — O99891 Other specified diseases and conditions complicating pregnancy: Secondary | ICD-10-CM | POA: Insufficient documentation

## 2020-10-07 DIAGNOSIS — Z3A29 29 weeks gestation of pregnancy: Secondary | ICD-10-CM | POA: Diagnosis not present

## 2020-10-07 DIAGNOSIS — R102 Pelvic and perineal pain unspecified side: Secondary | ICD-10-CM

## 2020-10-07 DIAGNOSIS — R197 Diarrhea, unspecified: Secondary | ICD-10-CM

## 2020-10-07 DIAGNOSIS — O24410 Gestational diabetes mellitus in pregnancy, diet controlled: Secondary | ICD-10-CM

## 2020-10-07 DIAGNOSIS — R109 Unspecified abdominal pain: Secondary | ICD-10-CM | POA: Diagnosis present

## 2020-10-07 DIAGNOSIS — O26899 Other specified pregnancy related conditions, unspecified trimester: Secondary | ICD-10-CM

## 2020-10-07 LAB — URINALYSIS, ROUTINE W REFLEX MICROSCOPIC
Bacteria, UA: NONE SEEN
Bilirubin Urine: NEGATIVE
Glucose, UA: NEGATIVE mg/dL
Hgb urine dipstick: NEGATIVE
Ketones, ur: NEGATIVE mg/dL
Nitrite: NEGATIVE
Protein, ur: NEGATIVE mg/dL
Specific Gravity, Urine: 1.016 (ref 1.005–1.030)
pH: 7 (ref 5.0–8.0)

## 2020-10-07 LAB — GLUCOSE, CAPILLARY: Glucose-Capillary: 113 mg/dL — ABNORMAL HIGH (ref 70–99)

## 2020-10-07 LAB — GC/CHLAMYDIA PROBE AMP (~~LOC~~) NOT AT ARMC
Chlamydia: NEGATIVE
Comment: NEGATIVE
Comment: NORMAL
Neisseria Gonorrhea: NEGATIVE

## 2020-10-07 MED ORDER — LOPERAMIDE HCL 2 MG PO CAPS
2.0000 mg | ORAL_CAPSULE | Freq: Once | ORAL | Status: AC
  Administered 2020-10-07: 2 mg via ORAL
  Filled 2020-10-07: qty 1

## 2020-10-07 MED ORDER — LOPERAMIDE HCL 2 MG PO CAPS: 2.0000 mg | ORAL_CAPSULE | Freq: Four times a day (QID) | ORAL | 0 refills | Status: DC | PRN

## 2020-10-07 NOTE — Progress Notes (Signed)
   Patient in clinic for blood sugar check. Patient was seen at [facility]U over weekend with blood glucose of over 500. Patient demonstrated how to check blood glucose properly. Patient was not sure why her blood glucose level was that high after eating chicken. Patient has an upcoming appointment on 10/24/20 [facility].   Clovis Pu, RN

## 2020-10-07 NOTE — MAU Provider Note (Signed)
History     CSN: [id number]  Arrival date and time: 10/07/20 0043   Event Date/Time   First Provider Initiated Contact with Patient 10/07/20 0125      Chief Complaint  Patient presents with  . Abdominal Pain   HPI Bethany Avery is a 20 y.o. G2P0010 at 33w5dwho presents stating she checked her blood sugar at 2300 and it was 571. She states she ate chicken at 2000, fell asleep and forgot to check her CBG. She woke up and checked it and it was high. She called the on call nurse, reported the CBG value she received and was told to come in. She also reports she has continued to have diarrhea since she was seen on 5/14. She reports vaginal pain that is worse when she sits up. She is currently taking metronidazole for BV.   OB History    Gravida  2   Para      Term      Preterm      AB  1   Living        SAB  1   IAB      Ectopic      Multiple      Live Births              Past Medical History:  Diagnosis Date  . ADHD (attention deficit hyperactivity disorder)   . Gonorrhea 02/02/2018    History reviewed. No pertinent surgical history.  Family History  Problem Relation Age of Onset  . Healthy Mother   . Healthy Father     Social History   Tobacco Use  . Smoking status: Never Smoker  . Smokeless tobacco: Never Used  Vaping Use  . Vaping Use: Never used  Substance Use Topics  . Alcohol use: Not Currently  . Drug use: Not Currently    Types: Marijuana    Comment: used this week    Allergies:  Allergies  Allergen Reactions  . Nickel Rash    Medications Prior to Admission  Medication Sig Dispense Refill Last Dose  . aspirin EC 81 MG tablet Take 1 tablet (81 mg total) by mouth daily. 60 tablet 2 10/06/2020 at Unknown time  . cyclobenzaprine (FLEXERIL) 10 MG tablet Take 1 tablet (10 mg total) by mouth 3 (three) times daily as needed (for headachec). 90 tablet 0 10/06/2020 at Unknown time  . Prenat w/o A Vit-FeFum-FePo-FA (CONCEPT OB)  130-92.4-1 MG CAPS Take 1 capsule by mouth daily. 30 capsule 11 10/06/2020 at Unknown time  . Accu-Chek Softclix Lancets lancets Use as instructed 100 each 12   . albuterol (VENTOLIN HFA) 108 (90 Base) MCG/ACT inhaler Inhale 1-2 puffs into the lungs every 6 (six) hours as needed for wheezing or shortness of breath. 18 g 0   . Blood Glucose Monitoring Suppl (ACCU-CHEK GUIDE) w/Device KIT 1 each by Does not apply route 4 (four) times daily. 1 kit 0   . Blood Pressure Monitoring (BLOOD PRESSURE MONITOR AUTOMAT) DEVI 1 Device by Does not apply route daily. Automatic blood pressure cuff large size. To monitor blood pressure regularly at home. ICD-10 code:Z34.90 1 each 0   . Doxylamine-Pyridoxine 10-10 MG TBEC Take 2 tablets by mouth at bedtime. (Patient not taking: Reported on 09/26/2020) 60 tablet 2   . fluticasone (FLONASE) 50 MCG/ACT nasal spray Place 1-2 sprays into both nostrils daily for 7 days. 1 g 0   . glucose blood (ACCU-CHEK GUIDE) test strip 1 each by Other  route in the morning, at noon, in the evening, and at bedtime. Use as instructed 100 each 12   . metoCLOPramide (REGLAN) 10 MG tablet Take 1 tablet (10 mg total) by mouth every 8 (eight) hours as needed (take with tylenol for headaches). (Patient not taking: Reported on 09/26/2020) 30 tablet 0   . metroNIDAZOLE (FLAGYL) 500 MG tablet Take 1 tablet (500 mg total) by mouth 2 (two) times daily. 14 tablet 0   . Misc. Devices (GOJJI WEIGHT SCALE) MISC 1 Device by Does not apply route daily as needed. To weight self daily as needed at home. ICD-10 code: Z34.90 1 each 0   . ondansetron (ZOFRAN ODT) 4 MG disintegrating tablet Take 1 tablet (4 mg total) by mouth every 8 (eight) hours as needed for nausea or vomiting. (Patient not taking: Reported on 09/26/2020) 15 tablet 0   . terconazole (TERAZOL 7) 0.4 % vaginal cream Place 1 applicator vaginally at bedtime. (Patient not taking: Reported on 09/26/2020) 45 g 0     Review of Systems  Constitutional:  Negative.  Negative for fatigue and fever.  HENT: Negative.   Respiratory: Negative.  Negative for shortness of breath.   Cardiovascular: Negative.  Negative for chest pain.  Gastrointestinal: Positive for diarrhea. Negative for abdominal pain, constipation, nausea and vomiting.  Genitourinary: Positive for vaginal pain. Negative for dysuria, vaginal bleeding and vaginal discharge.  Neurological: Negative.  Negative for dizziness and headaches.   Physical Exam   Blood pressure 118/70, pulse (!) 102, temperature 98.1 F (36.7 C), temperature source Oral, resp. rate 18, last menstrual period 03/07/2020, SpO2 100 %.  Physical Exam Vitals and nursing note reviewed.  Constitutional:      General: She is not in acute distress.    Appearance: She is well-developed.  HENT:     Head: Normocephalic.  Eyes:     Pupils: Pupils are equal, round, and reactive to light.  Cardiovascular:     Rate and Rhythm: Normal rate and regular rhythm.     Heart sounds: Normal heart sounds.  Pulmonary:     Effort: Pulmonary effort is normal. No respiratory distress.     Breath sounds: Normal breath sounds.  Abdominal:     General: Bowel sounds are normal. There is no distension.     Palpations: Abdomen is soft.     Tenderness: There is no abdominal tenderness.  Skin:    General: Skin is warm and dry.  Neurological:     Mental Status: She is alert and oriented to person, place, and time.  Psychiatric:        Behavior: Behavior normal.        Thought Content: Thought content normal.        Judgment: Judgment normal.     Fetal Tracing:  Baseline: 145 Variability: moderate Accels: 15x15 Decels: none  Toco: none   MAU Course  Procedures Results for orders placed or performed during the hospital encounter of 10/07/20 (from the past 24 hour(s))  Glucose, capillary     Status: Abnormal   Collection Time: 10/07/20 12:57 AM  Result Value Ref Range   Glucose-Capillary 113 (H) 70 - 99 mg/dL   Urinalysis, Routine w reflex microscopic Urine, Clean Catch     Status: Abnormal   Collection Time: 10/07/20  1:00 AM  Result Value Ref Range   Color, Urine YELLOW YELLOW   APPearance CLOUDY (A) CLEAR   Specific Gravity, Urine 1.016 1.005 - 1.030   pH 7.0 5.0 - 8.0  Glucose, UA NEGATIVE NEGATIVE mg/dL   Hgb urine dipstick NEGATIVE NEGATIVE   Bilirubin Urine NEGATIVE NEGATIVE   Ketones, ur NEGATIVE NEGATIVE mg/dL   Protein, ur NEGATIVE NEGATIVE mg/dL   Nitrite NEGATIVE NEGATIVE   Leukocytes,Ua MODERATE (A) NEGATIVE   RBC / HPF 0-5 0 - 5 RBC/hpf   WBC, UA 6-10 0 - 5 WBC/hpf   Bacteria, UA NONE SEEN NONE SEEN   Squamous Epithelial / LPF 11-20 0 - 5   Mucus PRESENT    Amorphous Crystal PRESENT    MDM UA, UC CBG Imodium PO  Reassurance provided of CBG tonight. Discussed patient coming in this week to the office to check meter. Patient requested ultrasound and CNM reassured patient of FHR tracing and no indication for ultrasound tonight.   Assessment and Plan   1. Vaginal pain   2. Diet controlled gestational diabetes mellitus (GDM) in third trimester   3. [redacted] weeks gestation of pregnancy   4. Diarrhea during pregnancy    -Discharge home in stable condition -Rx for imodium sent to patient's pharmcy -Third trimester precautions discussed -Patient advised to follow-up with [facility] this week, message sent -Patient may return to MAU as needed or if her condition were to change or worsen   [place] 10/07/2020, 1:25 AM

## 2020-10-07 NOTE — MAU Note (Signed)
Pt reports her CBG at home was 571, diarrhea for 2 days, today lower abd pain and vaginal pain. Denies bleeding.

## 2020-10-07 NOTE — Discharge Instructions (Signed)
Safe Medications in Pregnancy   Acne: Benzoyl Peroxide Salicylic Acid  Backache/Headache: Tylenol: 2 regular strength every 4 hours OR              2 Extra strength every 6 hours  Colds/Coughs/Allergies: Benadryl (alcohol free) 25 mg every 6 hours as needed Breath right strips Claritin Cepacol throat lozenges Chloraseptic throat spray Cold-Eeze- up to three times per day Cough drops, alcohol free Flonase (by prescription only) Guaifenesin Mucinex Robitussin DM (plain only, alcohol free) Saline nasal spray/drops Sudafed (pseudoephedrine) & Actifed ** use only after [redacted] weeks gestation and if you do not have high blood pressure Tylenol Vicks Vaporub Zinc lozenges Zyrtec   Constipation: Colace Ducolax suppositories Fleet enema Glycerin suppositories Metamucil Milk of magnesia Miralax Senokot Smooth move tea  Diarrhea: Kaopectate Imodium A-D  *NO pepto Bismol  Hemorrhoids: Anusol Anusol HC Preparation H Tucks  Indigestion: Tums Maalox Mylanta Zantac  Pepcid  Insomnia: Benadryl (alcohol free) 25mg  every 6 hours as needed Tylenol PM Unisom, no Gelcaps  Leg Cramps: Tums MagGel  Nausea/Vomiting:  Bonine Dramamine Emetrol Ginger extract Sea bands Meclizine  Nausea medication to take during pregnancy:  Unisom (doxylamine succinate 25 mg tablets) Take one tablet daily at bedtime. If symptoms are not adequately controlled, the dose can be increased to a maximum recommended dose of two tablets daily (1/2 tablet in the morning, 1/2 tablet mid-afternoon and one at bedtime). Vitamin B6 100mg  tablets. Take one tablet twice a day (up to 200 mg per day).  Skin Rashes: Aveeno products Benadryl cream or 25mg  every 6 hours as needed Calamine Lotion 1% cortisone cream  Yeast infection: Gyne-lotrimin 7 Monistat 7   **If taking multiple medications, please check labels to avoid duplicating the same active ingredients **take medication as directed on  the label ** Do not exceed 4000 mg of tylenol in 24 hours **Do not take medications that contain aspirin or ibuprofen     Gestational Diabetes Mellitus, Diagnosis Gestational diabetes mellitus is a form of diabetes. It can happen when you are pregnant. The diabetes goes away after you give birth. If you do not get treated for this condition, it may cause problems for you or your baby. What are the causes? This condition is caused by changes in your body when you are pregnant. When these happen:  A part of the body called the pancreas does not make enough insulin.  The body cannot use insulin in the right way. Sugars cannot get into cells in your body. The sugars stay in your blood. This leads to high blood sugar.   What increases the risk?  Being older than age 24 when pregnant.  Having someone with diabetes in your family.  Too much body weight.  Having had this condition in the past.  Polycystic ovary syndrome.  Being pregnant with more than one baby. What are the signs or symptoms?  Being thirsty often.  Being hungry often.  Needing to pee more often. How is this treated?  Eat a healthy diet.  Get more exercise.  Check your blood sugar often.  Take insulin and other medicines, if needed.  Work with an on this condition, if told. Follow these instructions at home: Learn about your diabetes Ask your doctor:  How often should I check my blood sugar? Where do I get the equipment?  What medicines do I need? When should I take them?  Do I need to meet with an educator?  Who can I call if I have  questions?  Where can I find a support group? General instructions  Take medicines only as told by your doctor.  Stay at a healthy weight.  Drink enough fluid to keep your pee pale yellow.  Wear an alert bracelet or carry a card that shows you have this condition.  Keep all follow-up visits. Where to find more information  American Diabetes  Association (ADA): diabetes.org  Association of Diabetes Care & Education Specialists (ADCES): diabeteseducator.org  Centers for Disease Control and Prevention (CDC): TonerPromos.no  American Pregnancy Association: americanpregnancy.org  [place]. Department of Agriculture MyPlate: WrestlingReporter.dk Contact a doctor if:  Your blood sugar is at or above 240 mg/dL (02.5 mmol/L).  Your blood sugar is at or above 200 mg/dL (42.7 mmol/L) and you have ketones in your pee.  You have a fever.  You are sick for 2 days or more and you do not get better.  You have either of these problems for more than 6 hours: ? You vomit every time you eat or drink. ? You have watery poop (diarrhea). Get help right away if:  You cannot think clearly.  You are not breathing well.  You have a lot of ketones in your pee.  Your baby seems to move less than normal.  Abnormal fluid or blood starts to come out of your vagina.  You start having contractions before your due date. You may feel your belly tighten.  You have a very bad headache. These symptoms may be an emergency. Get help right away. Call your local emergency services (911 in the [place]  Do not wait to see if the symptoms will go away.  Do not drive yourself to the hospital. Summary  Gestational diabetes is a form of diabetes. It can happen when you are pregnant.  This condition occurs when your body cannot make or use insulin in the right way.  Eat a healthy diet, exercise, and use medicines or insulin as told by your doctor.  Tell your doctor if your blood sugar is high, you have a fever, or you vomit every time you eat or drink.  Get help right away if you cannot think clearly, you are not breathing well, or your baby seems to move less than normal. This information is not intended to replace advice given to you by your health care provider. Make sure you discuss any questions you have with your health care provider. Document Revised: 10/16/2019  Document Reviewed: 10/16/2019 Elsevier Patient Education  2021 [employer].

## 2020-10-08 LAB — CULTURE, OB URINE

## 2020-10-17 ENCOUNTER — Other Ambulatory Visit: Payer: Self-pay | Admitting: *Deleted

## 2020-10-17 DIAGNOSIS — O24410 Gestational diabetes mellitus in pregnancy, diet controlled: Secondary | ICD-10-CM

## 2020-10-17 MED ORDER — ACCU-CHEK GUIDE VI STRP: 1.0000 | ORAL_STRIP | Freq: Four times a day (QID) | 12 refills | Status: DC

## 2020-10-17 MED ORDER — ACCU-CHEK GUIDE W/DEVICE KIT: 1.0000 | PACK | Freq: Four times a day (QID) | 0 refills | Status: DC

## 2020-10-21 ENCOUNTER — Other Ambulatory Visit: Payer: Self-pay

## 2020-10-21 ENCOUNTER — Inpatient Hospital Stay (HOSPITAL_COMMUNITY)
Admission: AD | Admit: 2020-10-21 | Discharge: 2020-10-21 | Disposition: A | Discharge status: Home / Self Care | Payer: Medicaid Other | Attending: Obstetrics and Gynecology | Admitting: Obstetrics and Gynecology

## 2020-10-21 ENCOUNTER — Encounter (HOSPITAL_COMMUNITY): Payer: Self-pay | Admitting: Obstetrics and Gynecology

## 2020-10-21 DIAGNOSIS — O26893 Other specified pregnancy related conditions, third trimester: Secondary | ICD-10-CM | POA: Insufficient documentation

## 2020-10-21 DIAGNOSIS — Z3689 Encounter for other specified antenatal screening: Secondary | ICD-10-CM

## 2020-10-21 DIAGNOSIS — R102 Pelvic and perineal pain: Secondary | ICD-10-CM | POA: Diagnosis not present

## 2020-10-21 DIAGNOSIS — Z3A31 31 weeks gestation of pregnancy: Secondary | ICD-10-CM | POA: Diagnosis not present

## 2020-10-21 HISTORY — DX: Anxiety disorder, unspecified: F41.9

## 2020-10-21 HISTORY — DX: Gestational diabetes mellitus in pregnancy, unspecified control: O24.419

## 2020-10-21 HISTORY — DX: Headache, unspecified: R51.9

## 2020-10-21 HISTORY — DX: Depression, unspecified: F32.A

## 2020-10-21 LAB — URINALYSIS, ROUTINE W REFLEX MICROSCOPIC
Bilirubin Urine: NEGATIVE
Glucose, UA: NEGATIVE mg/dL
Hgb urine dipstick: NEGATIVE
Ketones, ur: NEGATIVE mg/dL
Nitrite: NEGATIVE
Protein, ur: NEGATIVE mg/dL
Specific Gravity, Urine: 1.019 (ref 1.005–1.030)
pH: 6 (ref 5.0–8.0)

## 2020-10-21 LAB — WET PREP, GENITAL
Sperm: NONE SEEN
Trich, Wet Prep: NONE SEEN
Yeast Wet Prep HPF POC: NONE SEEN

## 2020-10-21 MED ORDER — ACETAMINOPHEN 500 MG PO TABS
1000.0000 mg | ORAL_TABLET | Freq: Four times a day (QID) | ORAL | Status: DC | PRN
  Administered 2020-10-21: 1000 mg via ORAL
  Filled 2020-10-21: qty 2

## 2020-10-21 NOTE — MAU Note (Addendum)
Pt reports lower uterine cramping and pressure that began at [employer]. [place] Pain began after and she also states it hurts to try to close her legs.  [place] noticed pain in her rectal area.Pt denies SROM, vaginal bleeding or discharge. Endorses + fetal movement [place]-states now it causes her discomfort when he moves. She is unsure if she is contracting buts feels cramping pain and pressure every 10 min. Twice uterus was palpated when pt reported pain. I was unable to palpate a contraction.

## 2020-10-21 NOTE — MAU Note (Signed)
Transported from ED via EMS. [place], reports started having intermittent lower abd pain, pressure, and can't walk around 1945 today every 10-15 minutes. Denies VB and LOF, reports decreased FM.

## 2020-10-21 NOTE — MAU Provider Note (Addendum)
History     CSN: [id number]  Arrival date and time: 10/21/20 1958   Event Date/Time   First Provider Initiated Contact with Patient 10/21/20 2159      Chief Complaint  Patient presents with  . Abdominal Pain   20 y.o. G2P0010 $RemoveBefo'@[id number]'ohHSLEKdwCp$ .5 wks presenting with pelvic pain. Report onset a few hours ago. Earlier today she was riding a horse then [place] the lake. States the pain started after she got out of the water. Describes as intermittent cramping and sharp pain in her pelvic and vaginal area. Pain also occurs when baby moves. Denies urinary sx. Denies VB or LOF. Reports vaginal itching recently. Denies malodor. Thinks she may have yeast infection. Had sex in last 24 hrs.   OB History    Gravida  2   Para      Term      Preterm      AB  1   Living        SAB  1   IAB      Ectopic      Multiple      Live Births              Past Medical History:  Diagnosis Date  . ADHD (attention deficit hyperactivity disorder)   . Anxiety   . Depression   . Gestational diabetes   . Gonorrhea 02/02/2018  . Headache     History reviewed. No pertinent surgical history.  Family History  Problem Relation Age of Onset  . Healthy Mother   . Healthy Father     Social History   Tobacco Use  . Smoking status: Never Smoker  . Smokeless tobacco: Never Used  Vaping Use  . Vaping Use: Never used  Substance Use Topics  . Alcohol use: Not Currently  . Drug use: Not Currently    Types: Marijuana    Comment: used this week    Allergies:  Allergies  Allergen Reactions  . Nickel Rash    Medications Prior to Admission  Medication Sig Dispense Refill Last Dose  . aspirin EC 81 MG tablet Take 1 tablet (81 mg total) by mouth daily. 60 tablet 2 10/21/2020 at Unknown time  . cyclobenzaprine (FLEXERIL) 10 MG tablet Take 10 mg by mouth 3 (three) times daily as needed for muscle spasms.   10/20/2020 at Unknown time  . Prenat w/o A Vit-FeFum-FePo-FA (CONCEPT OB) 130-92.4-1  MG CAPS Take 1 capsule by mouth daily. 30 capsule 11 10/21/2020 at Unknown time  . Accu-Chek Softclix Lancets lancets Use as instructed 100 each 12   . albuterol (VENTOLIN HFA) 108 (90 Base) MCG/ACT inhaler Inhale 1-2 puffs into the lungs every 6 (six) hours as needed for wheezing or shortness of breath. 18 g 0   . Blood Glucose Monitoring Suppl (ACCU-CHEK GUIDE) w/Device KIT 1 each by Does not apply route 4 (four) times daily. 1 kit 0   . Blood Pressure Monitoring (BLOOD PRESSURE MONITOR AUTOMAT) DEVI 1 Device by Does not apply route daily. Automatic blood pressure cuff large size. To monitor blood pressure regularly at home. ICD-10 code:Z34.90 1 each 0   . Doxylamine-Pyridoxine 10-10 MG TBEC Take 2 tablets by mouth at bedtime. (Patient not taking: Reported on 09/26/2020) 60 tablet 2   . fluticasone (FLONASE) 50 MCG/ACT nasal spray Place 1-2 sprays into both nostrils daily for 7 days. 1 g 0   . glucose blood (ACCU-CHEK GUIDE) test strip 1 each by Other route in the  morning, at noon, in the evening, and at bedtime. Use as instructed 100 each 12   . loperamide (IMODIUM) 2 MG capsule Take 1 capsule (2 mg total) by mouth 4 (four) times daily as needed for diarrhea or loose stools. 12 capsule 0   . metoCLOPramide (REGLAN) 10 MG tablet Take 1 tablet (10 mg total) by mouth every 8 (eight) hours as needed (take with tylenol for headaches). (Patient not taking: Reported on 09/26/2020) 30 tablet 0   . metroNIDAZOLE (FLAGYL) 500 MG tablet Take 1 tablet (500 mg total) by mouth 2 (two) times daily. 14 tablet 0   . Misc. Devices (GOJJI WEIGHT SCALE) MISC 1 Device by Does not apply route daily as needed. To weight self daily as needed at home. ICD-10 code: Z34.90 1 each 0   . ondansetron (ZOFRAN ODT) 4 MG disintegrating tablet Take 1 tablet (4 mg total) by mouth every 8 (eight) hours as needed for nausea or vomiting. (Patient not taking: Reported on 09/26/2020) 15 tablet 0   . terconazole (TERAZOL 7) 0.4 % vaginal cream  Place 1 applicator vaginally at bedtime. (Patient not taking: Reported on 09/26/2020) 45 g 0     Review of Systems  Gastrointestinal: Negative for abdominal pain.  Genitourinary: Positive for pelvic pain and vaginal discharge. Negative for vaginal bleeding.   Physical Exam   Blood pressure 132/83, pulse 90, temperature 98 F (36.7 C), temperature source Oral, resp. rate 16, height $RemoveBe'5\' 5"'pDLqQkOYa$  (1.651 m), weight 102 kg, last menstrual period 03/07/2020, SpO2 100 %.  Physical Exam Vitals and nursing note reviewed. Exam conducted with a chaperone present.  Constitutional:      Appearance: Normal appearance.  HENT:     Head: Normocephalic and atraumatic.  Cardiovascular:     Rate and Rhythm: Normal rate.  Pulmonary:     Effort: Pulmonary effort is normal. No respiratory distress.  Abdominal:     Palpations: Abdomen is soft.     Tenderness: There is abdominal tenderness (SP region).  Genitourinary:    Comments: VE: closed/thick, white discharge at introitus Musculoskeletal:        General: Normal range of motion.     Cervical back: Normal range of motion.  Skin:    General: Skin is warm and dry.  Neurological:     General: No focal deficit present.     Mental Status: She is alert and oriented to person, place, and time.  Psychiatric:        Mood and Affect: Mood normal.        Behavior: Behavior normal.   EFM: 140 bpm, mod variability, + accels, no decels Toco: none  Results for orders placed or performed during the hospital encounter of 10/21/20 (from the past 24 hour(s))  Urinalysis, Routine w reflex microscopic     Status: Abnormal   Collection Time: 10/21/20  9:26 PM  Result Value Ref Range   Color, Urine YELLOW YELLOW   APPearance CLOUDY (A) CLEAR   Specific Gravity, Urine 1.019 1.005 - 1.030   pH 6.0 5.0 - 8.0   Glucose, UA NEGATIVE NEGATIVE mg/dL   Hgb urine dipstick NEGATIVE NEGATIVE   Bilirubin Urine NEGATIVE NEGATIVE   Ketones, ur NEGATIVE NEGATIVE mg/dL   Protein,  ur NEGATIVE NEGATIVE mg/dL   Nitrite NEGATIVE NEGATIVE   Leukocytes,Ua MODERATE (A) NEGATIVE   RBC / HPF 0-5 0 - 5 RBC/hpf   WBC, UA 0-5 0 - 5 WBC/hpf   Bacteria, UA RARE (A) NONE SEEN   Squamous  Epithelial / LPF 0-5 0 - 5   Mucus PRESENT   Wet prep, genital     Status: Abnormal   Collection Time: 10/21/20 10:12 PM   Specimen: Vaginal  Result Value Ref Range   Yeast Wet Prep HPF POC NONE SEEN NONE SEEN   Trich, Wet Prep NONE SEEN NONE SEEN   Clue Cells Wet Prep HPF POC PRESENT (A) NONE SEEN   WBC, Wet Prep HPF POC MANY (A) NONE SEEN   Sperm NONE SEEN    MAU Course  Procedures Tylenol   MDM Labs ordered and reviewed. No signs of PTL. Pain likely MSK and exacerbated by fetal position. Pt reports improvement. Stable for discharge home.   Assessment and Plan   1. [redacted] weeks gestation of pregnancy   2. NST (non-stress test) reactive   3. Pelvic pain affecting pregnancy in third trimester, antepartum    Discharge home [facility] at [facility] as scheduled PTL precautions Tylenol/heat prn  Allergies as of 10/21/2020      Reactions   Nickel Rash      Medication List    STOP taking these medications   metroNIDAZOLE 500 MG tablet Commonly known as: FLAGYL   terconazole 0.4 % vaginal cream Commonly known as: TERAZOL 7     TAKE these medications   Accu-Chek Guide test strip Generic drug: glucose blood 1 each by Other route in the morning, at noon, in the evening, and at bedtime. Use as instructed   Accu-Chek Guide w/Device Kit 1 each by Does not apply route 4 (four) times daily.   Accu-Chek Softclix Lancets lancets Use as instructed   albuterol 108 (90 Base) MCG/ACT inhaler Commonly known as: VENTOLIN HFA Inhale 1-2 puffs into the lungs every 6 (six) hours as needed for wheezing or shortness of breath.   aspirin EC 81 MG tablet Take 1 tablet (81 mg total) by mouth daily.   Blood Pressure Monitor Automat Devi 1 Device by Does not apply route daily. Automatic blood  pressure cuff large size. To monitor blood pressure regularly at home. ICD-10 code:Z34.90   Concept OB 130-92.4-1 MG Caps Take 1 capsule by mouth daily.   cyclobenzaprine 10 MG tablet Commonly known as: FLEXERIL Take 10 mg by mouth 3 (three) times daily as needed for muscle spasms.   Doxylamine-Pyridoxine 10-10 MG Tbec Take 2 tablets by mouth at bedtime.   fluticasone 50 MCG/ACT nasal spray Commonly known as: FLONASE Place 1-2 sprays into both nostrils daily for 7 days.   Gojji Weight Scale Misc 1 Device by Does not apply route daily as needed. To weight self daily as needed at home. ICD-10 code: Z34.90   loperamide 2 MG capsule Commonly known as: IMODIUM Take 1 capsule (2 mg total) by mouth 4 (four) times daily as needed for diarrhea or loose stools.   metoCLOPramide 10 MG tablet Commonly known as: REGLAN Take 1 tablet (10 mg total) by mouth every 8 (eight) hours as needed (take with tylenol for headaches).   ondansetron 4 MG disintegrating tablet Commonly known as: Zofran ODT Take 1 tablet (4 mg total) by mouth every 8 (eight) hours as needed for nausea or vomiting.      Julianne Handler, CNM 10/21/2020, 10:12 PM

## 2020-10-21 NOTE — ED Provider Notes (Signed)
Emergency Medicine Provider Triage Evaluation Note  Dafna Romo , a 20 y.o. female  was evaluated in triage.  Pt complains of abd cramping. No vaginal bleeding, no loss of fluids  Review of Systems  Positive: abd cramping Negative: Vaginal bleeding  Physical Exam  BP 125/76 (BP Location: Right Arm)   Pulse 98   Temp 97.9 F (36.6 C)   Resp 20   LMP 03/07/2020   SpO2 100%  Gen:   Awake, no distress   Resp:  Normal effort  MSK:   Moves extremities without difficulty  Other:  Lower abd ttp  Medical Decision Making  Medically screening exam initiated at 8:24 PM.  Appropriate orders placed.  Heavenleigh Zayna Toste was informed that the remainder of the evaluation will be completed by another provider, this initial triage assessment does not replace that evaluation, and the importance of remaining in the ED until their evaluation is complete.  8:25 PM Discussed case with Samara Deist from MAU who accepts patient for transfer   Rayne Du 10/21/20 2026    Tegeler, Canary Brim, MD 10/21/20 2256

## 2020-10-21 NOTE — MAU Note (Signed)
Pt up to bathroom to urinate-pt called RN to room stating that she is feeling better, "she was able to walk." Pt got dressed and is requesting that she be discharged to home.

## 2020-10-21 NOTE — Discharge Instructions (Signed)
Abdominal Pain During Pregnancy Belly (abdominal) pain is common during pregnancy. There are many possible causes. Some causes are more serious than others. Sometimes the cause is not known. Always tell your doctor if you have belly pain. Follow these instructions at home:  Do not have sex or put anything in your vagina until your pain goes away completely.  Get plenty of rest until your pain gets better.  Drink enough fluid to keep your pee (urine) pale yellow.  Take over-the-counter and prescription medicines only as told by your doctor.  Keep all follow-up visits.   Contact a doctor if:  You keep having pain after resting.  Your pain gets worse after resting.  You have lower belly pain that: ? Comes and goes at regular times. ? Spreads to your back. ? Feels like menstrual cramps.  You have pain or burning when you pee (urinate). Get help right away if:  You have a fever or chills.  You feel like it is hard to breathe.  You have bleeding from your vagina.  You are leaking fluid or tissue from your vagina.  You vomit for more than 24 hours.  You have watery poop (diarrhea) for more than 24 hours.  Your baby is moving less than usual.  You feel very weak or faint.  You have very bad pain in your upper belly. Summary  Belly pain is common during pregnancy. There are many possible causes.  If you have belly pain during pregnancy, tell your doctor right away.  Keep all follow-up visits. This information is not intended to replace advice given to you by your health care provider. Make sure you discuss any questions you have with your health care provider. Document Revised: 01/23/2020 Document Reviewed: 01/23/2020 Elsevier Patient Education  2021 [employer]  Back Pain in Pregnancy Back pain during pregnancy is common. Back pain may be caused by several factors that are related to changes during your pregnancy. Follow these instructions at home: Managing pain,  stiffness, and swelling  If directed, for sudden (acute) back pain, put ice on the painful area. ? Put ice in a plastic bag. ? Place a towel between your skin and the bag. ? Leave the ice on for 20 minutes, 2-3 times per day.  If directed, apply heat to the affected area before you exercise. Use the heat source that your health care provider recommends, such as a moist heat pack or a heating pad. ? Place a towel between your skin and the heat source. ? Leave the heat on for 20-30 minutes. ? Remove the heat if your skin turns bright red. This is especially important if you are unable to feel pain, heat, or cold. You may have a greater risk of getting burned.  If directed, massage the affected area.      Activity  Exercise as told by your health care provider. Gentle exercise is the best way to prevent or manage back pain.  Listen to your body when lifting. If lifting hurts, ask for help or bend your knees. This uses your leg muscles instead of your back muscles.  Squat down when picking up something from the floor. Do not bend over.  Only use bed rest for short periods as told by your health care provider. Bed rest should only be used for the most severe episodes of back pain. Standing, sitting, and lying down  Do not stand in one place for long periods of time.  Use good posture when sitting. Make  sure your head rests over your shoulders and is not hanging forward. Use a pillow on your lower back if necessary.  Try sleeping on your side, preferably the left side, with a pregnancy support pillow or 1-2 regular pillows between your legs. ? If you have back pain after a night's rest, your bed may be too soft. ? A firm mattress may provide more support for your back during pregnancy. General instructions  Do not wear high heels.  Eat a healthy diet. Try to gain weight within your health care provider's recommendations.  Use a maternity girdle, elastic sling, or back brace as told  by your health care provider.  Take over-the-counter and prescription medicines only as told by your health care provider.  Work with a physical therapist or massage therapist to find ways to manage back pain. Acupuncture or massage therapy may be helpful.  Keep all follow-up visits as told by your health care provider. This is important. Contact a health care provider if:  Your back pain interferes with your daily activities.  You have increasing pain in other parts of your body. Get help right away if:  You develop numbness, tingling, weakness, or problems with the use of your arms or legs.  You develop severe back pain that is not controlled with medicine.  You have a change in bowel or bladder control.  You develop shortness of breath, dizziness, or you faint.  You develop nausea, vomiting, or sweating.  You have back pain that is a rhythmic, cramping pain similar to labor pains. Labor pain is usually 1-2 minutes apart, lasts for about 1 minute, and involves a bearing down feeling or pressure in your pelvis.  You have back pain and your water breaks or you have vaginal bleeding.  You have back pain or numbness that travels down your leg.  Your back pain developed after you fell.  You develop pain on one side of your back.  You see blood in your urine.  You develop skin blisters in the area of your back pain. Summary  Back pain may be caused by several factors that are related to changes during your pregnancy.  Follow instructions as told by your health care provider for managing pain, stiffness, and swelling.  Exercise as told by your health care provider. Gentle exercise is the best way to prevent or manage back pain.  Take over-the-counter and prescription medicines only as told by your health care provider.  Keep all follow-up visits as told by your health care provider. This is important. This information is not intended to replace advice given to you by your  health care provider. Make sure you discuss any questions you have with your health care provider. Document Revised: 08/30/2018 Document Reviewed: 10/27/2017 Elsevier Patient Education  2021 [employer].

## 2020-10-24 ENCOUNTER — Encounter (HOSPITAL_COMMUNITY): Payer: Self-pay | Admitting: Obstetrics and Gynecology

## 2020-10-24 ENCOUNTER — Ambulatory Visit (INDEPENDENT_AMBULATORY_CARE_PROVIDER_SITE_OTHER): Payer: Medicaid Other | Admitting: Obstetrics and Gynecology

## 2020-10-24 ENCOUNTER — Encounter: Payer: Self-pay | Admitting: Obstetrics and Gynecology

## 2020-10-24 ENCOUNTER — Inpatient Hospital Stay (HOSPITAL_COMMUNITY)
Admission: AD | Admit: 2020-10-24 | Discharge: 2020-10-24 | Disposition: A | Discharge status: Home / Self Care | Payer: Medicaid Other | Attending: Obstetrics and Gynecology | Admitting: Obstetrics and Gynecology

## 2020-10-24 ENCOUNTER — Other Ambulatory Visit: Payer: Self-pay

## 2020-10-24 VITALS — BP 115/73 | HR 94 | Temp 98.3°F | Wt 224.4 lb

## 2020-10-24 DIAGNOSIS — Z3480 Encounter for supervision of other normal pregnancy, unspecified trimester: Secondary | ICD-10-CM

## 2020-10-24 DIAGNOSIS — R1011 Right upper quadrant pain: Secondary | ICD-10-CM | POA: Diagnosis not present

## 2020-10-24 DIAGNOSIS — R102 Pelvic and perineal pain: Secondary | ICD-10-CM | POA: Insufficient documentation

## 2020-10-24 DIAGNOSIS — Z79899 Other long term (current) drug therapy: Secondary | ICD-10-CM | POA: Diagnosis not present

## 2020-10-24 DIAGNOSIS — R1031 Right lower quadrant pain: Secondary | ICD-10-CM | POA: Diagnosis not present

## 2020-10-24 DIAGNOSIS — O368130 Decreased fetal movements, third trimester, not applicable or unspecified: Secondary | ICD-10-CM | POA: Diagnosis not present

## 2020-10-24 DIAGNOSIS — R197 Diarrhea, unspecified: Secondary | ICD-10-CM | POA: Insufficient documentation

## 2020-10-24 DIAGNOSIS — O322XX0 Maternal care for transverse and oblique lie, not applicable or unspecified: Secondary | ICD-10-CM | POA: Diagnosis not present

## 2020-10-24 DIAGNOSIS — O24419 Gestational diabetes mellitus in pregnancy, unspecified control: Secondary | ICD-10-CM

## 2020-10-24 DIAGNOSIS — O24410 Gestational diabetes mellitus in pregnancy, diet controlled: Secondary | ICD-10-CM

## 2020-10-24 DIAGNOSIS — R071 Chest pain on breathing: Secondary | ICD-10-CM | POA: Insufficient documentation

## 2020-10-24 DIAGNOSIS — O0990 Supervision of high risk pregnancy, unspecified, unspecified trimester: Secondary | ICD-10-CM

## 2020-10-24 DIAGNOSIS — Z3A32 32 weeks gestation of pregnancy: Secondary | ICD-10-CM | POA: Diagnosis not present

## 2020-10-24 DIAGNOSIS — M7918 Myalgia, other site: Secondary | ICD-10-CM

## 2020-10-24 DIAGNOSIS — O26893 Other specified pregnancy related conditions, third trimester: Secondary | ICD-10-CM | POA: Insufficient documentation

## 2020-10-24 LAB — COMPREHENSIVE METABOLIC PANEL
ALT: 15 U/L (ref 0–44)
AST: 14 U/L — ABNORMAL LOW (ref 15–41)
Albumin: 2.7 g/dL — ABNORMAL LOW (ref 3.5–5.0)
Alkaline Phosphatase: 78 U/L (ref 38–126)
Anion gap: 7 (ref 5–15)
BUN: 6 mg/dL (ref 6–20)
CO2: 23 mmol/L (ref 22–32)
Calcium: 8.5 mg/dL — ABNORMAL LOW (ref 8.9–10.3)
Chloride: 105 mmol/L (ref 98–111)
Creatinine, Ser: 0.58 mg/dL (ref 0.44–1.00)
GFR, Estimated: >60 mL/min (ref >60)
Glucose, Bld: 100 mg/dL — ABNORMAL HIGH (ref 70–99)
Potassium: 3.8 mmol/L (ref 3.5–5.1)
Sodium: 135 mmol/L (ref 135–145)
Total Bilirubin: 0.2 mg/dL — ABNORMAL LOW (ref 0.3–1.2)
Total Protein: 6 g/dL — ABNORMAL LOW (ref 6.5–8.1)

## 2020-10-24 LAB — URINALYSIS, ROUTINE W REFLEX MICROSCOPIC
Bilirubin Urine: NEGATIVE
Glucose, UA: NEGATIVE mg/dL
Hgb urine dipstick: NEGATIVE
Ketones, ur: NEGATIVE mg/dL
Nitrite: NEGATIVE
Protein, ur: NEGATIVE mg/dL
Specific Gravity, Urine: 1.014 (ref 1.005–1.030)
pH: 7 (ref 5.0–8.0)

## 2020-10-24 LAB — CBC
HCT: 32.8 % — ABNORMAL LOW (ref 36.0–46.0)
Hemoglobin: 10.9 g/dL — ABNORMAL LOW (ref 12.0–15.0)
MCH: 31.1 pg (ref 26.0–34.0)
MCHC: 33.2 g/dL (ref 30.0–36.0)
MCV: 93.7 fL (ref 80.0–100.0)
Platelets: 330 10*3/uL (ref 150–400)
RBC: 3.5 MIL/uL — ABNORMAL LOW (ref 3.87–5.11)
RDW: 12.4 % (ref 11.5–15.5)
WBC: 10.6 10*3/uL — ABNORMAL HIGH (ref 4.0–10.5)
nRBC: 0 % (ref 0.0–0.2)

## 2020-10-24 LAB — LIPASE, BLOOD: Lipase: 30 U/L (ref 11–51)

## 2020-10-24 LAB — FETAL FIBRONECTIN: Fetal Fibronectin: NEGATIVE

## 2020-10-24 MED ORDER — METFORMIN HCL 500 MG PO TABS: 500.0000 mg | ORAL_TABLET | Freq: Every day | ORAL | 3 refills | Status: DC

## 2020-10-24 MED ORDER — CYCLOBENZAPRINE HCL 5 MG PO TABS
5.0000 mg | ORAL_TABLET | Freq: Once | ORAL | Status: AC
  Administered 2020-10-24: 5 mg via ORAL
  Filled 2020-10-24: qty 1

## 2020-10-24 MED ORDER — ACETAMINOPHEN 500 MG PO TABS
1000.0000 mg | ORAL_TABLET | Freq: Once | ORAL | Status: AC
  Administered 2020-10-24: 1000 mg via ORAL
  Filled 2020-10-24: qty 2

## 2020-10-24 NOTE — MAU Note (Signed)
Patient left without signing printed AVS.

## 2020-10-24 NOTE — Progress Notes (Signed)
HIGH-RISK PREGNANCY OFFICE VISIT Patient name: Bethany Avery MRN [id number]  Date of birth: 2000-09-03 Chief Complaint:   Routine Prenatal Visit  History of Present Illness:   Bethany Avery is a 20 y.o. G110P0010 female at [redacted]w[redacted]d with an Estimated Date of Delivery: 12/18/20 being seen today for ongoing management of a high-risk pregnancy complicated by A1DM Today she reports occasional contractions and pelvic pressure. Contractions: Irregular. Vag. Bleeding: None.  Movement: Present. denies leaking of fluid.  Review of Systems:   Pertinent items are noted in HPI Denies abnormal vaginal discharge w/ itching/odor/irritation, headaches, visual changes, shortness of breath, chest pain, abdominal pain, severe nausea/vomiting, or problems with urination or bowel movements unless otherwise stated above. Pertinent History Reviewed:  Reviewed past medical,surgical, social, obstetrical and family history.  Reviewed problem list, medications and allergies. Physical Assessment:   Vitals:   10/24/20 1356  BP: 115/73  Pulse: 94  Temp: 98.3 F (36.8 C)  Weight: 224 lb 6.4 oz (101.8 kg)  Body mass index is 37.34 kg/m.           Physical Examination:   General appearance: alert, well appearing, and in no distress and oriented to person, place, and time  Mental status: alert, oriented to person, place, and time, normal mood, behavior, speech, dress, motor activity, and thought processes  Skin: warm & dry   Extremities: Edema: None    Cardiovascular: normal heart rate noted  Respiratory: normal respiratory effort, no distress  Abdomen: gravid, soft, non-tender  Pelvic: Cervical exam deferred         Fetal Status: Fetal Heart Rate (bpm): 151   Movement: Present    Fetal Surveillance Testing today: none   Results for orders placed or performed during the hospital encounter of 10/24/20 (from the past 24 hour(s))  Urinalysis, Routine w reflex microscopic Urine, Clean Catch    Collection Time: 10/24/20  6:20 AM  Result Value Ref Range   Color, Urine YELLOW YELLOW   APPearance HAZY (A) CLEAR   Specific Gravity, Urine 1.014 1.005 - 1.030   pH 7.0 5.0 - 8.0   Glucose, UA NEGATIVE NEGATIVE mg/dL   Hgb urine dipstick NEGATIVE NEGATIVE   Bilirubin Urine NEGATIVE NEGATIVE   Ketones, ur NEGATIVE NEGATIVE mg/dL   Protein, ur NEGATIVE NEGATIVE mg/dL   Nitrite NEGATIVE NEGATIVE   Leukocytes,Ua MODERATE (A) NEGATIVE   RBC / HPF 0-5 0 - 5 RBC/hpf   WBC, UA 0-5 0 - 5 WBC/hpf   Bacteria, UA RARE (A) NONE SEEN   Squamous Epithelial / LPF 0-5 0 - 5  Fetal fibronectin   Collection Time: 10/24/20  7:24 AM  Result Value Ref Range   Fetal Fibronectin NEGATIVE NEGATIVE  CBC   Collection Time: 10/24/20  7:31 AM  Result Value Ref Range   WBC 10.6 (H) 4.0 - 10.5 K/uL   RBC 3.50 (L) 3.87 - 5.11 MIL/uL   Hemoglobin 10.9 (L) 12.0 - 15.0 g/dL   HCT 81.2 (L) 75.1 - 70.0 %   MCV 93.7 80.0 - 100.0 fL   MCH 31.1 26.0 - 34.0 pg   MCHC 33.2 30.0 - 36.0 g/dL   RDW 17.4 94.4 - 96.7 %   Platelets 330 150 - 400 K/uL   nRBC 0.0 0.0 - 0.2 %  Comprehensive metabolic panel   Collection Time: 10/24/20  7:31 AM  Result Value Ref Range   Sodium 135 135 - 145 mmol/L   Potassium 3.8 3.5 - 5.1 mmol/L   Chloride 105 98 -  111 mmol/L   CO2 23 22 - 32 mmol/L   Glucose, Bld 100 (H) 70 - 99 mg/dL   BUN 6 6 - 20 mg/dL   Creatinine, Ser 6.38 0.44 - 1.00 mg/dL   Calcium 8.5 (L) 8.9 - 10.3 mg/dL   Total Protein 6.0 (L) 6.5 - 8.1 g/dL   Albumin 2.7 (L) 3.5 - 5.0 g/dL   AST 14 (L) 15 - 41 U/L   ALT 15 0 - 44 U/L   Alkaline Phosphatase 78 38 - 126 U/L   Total Bilirubin 0.2 (L) 0.3 - 1.2 mg/dL   GFR, Estimated >75 >64 mL/min   Anion gap 7 5 - 15  Lipase, blood   Collection Time: 10/24/20  7:31 AM  Result Value Ref Range   Lipase 30 11 - 51 U/L    Assessment & Plan:  1) High-risk pregnancy G2P0010 at [redacted]w[redacted]d with an Estimated Date of Delivery: 12/18/20   2) Supervision of high risk pregnancy,  antepartum   3) Gestational diabetes mellitus, class A2  - Review of Blood Sugar Levels: FBS range = 98-110 mg/dL  2 hr PP range = 33-295; 168 and 571 - Discussed the great majority of abnormal BS indicating non-compliance with diet - Explained that will need to start on medication - Advised that if needs to be switched to insulin, will need to transfer to one of our high risk OB offices - Rx for metFORMIN (GLUCOPHAGE) 500 MG tablet,  - Advised to adhere more to the diet she was given by diabetic educator and exercise - [place] [facility] OB FOLLOW UP   Meds:  Meds ordered this encounter  Medications  . metFORMIN (GLUCOPHAGE) 500 MG tablet    Sig: Take 1 tablet (500 mg total) by mouth at bedtime.    Dispense:  30 tablet    Refill:  3    Order Specific Question:   Supervising Provider    Answer:   Reva Bores [[id number]]    Labs/procedures today: none  Treatment Plan:  Rx Metformin 500 mg hs  Reviewed: Preterm labor symptoms and general obstetric precautions including but not limited to vaginal bleeding, contractions, leaking of fluid and fetal movement were reviewed in detail with the patient.  All questions were answered. Has home bp cuff. Check bp weekly, let us know if >140/90.   Follow-up: Return in about 2 weeks (around 11/07/2020) for Return OB visit.  Orders Placed This Encounter  Procedures  . [place] [facility] FOLLOW UP   Raelyn Mora MSN, CNM 10/24/2020 2:22 PM

## 2020-10-24 NOTE — Discharge Instructions (Signed)

## 2020-10-24 NOTE — MAU Provider Note (Addendum)
Chief Complaint:  Abdominal Pain   Event Date/Time   First Provider Initiated Contact with Patient 10/24/20 [phone]     HPI: Bethany Avery is a 20 y.o. G2P0010 at 58w1dwho presents to maternity admissions reporting sharp pain in RUQ which radiates down to lower abdomen on right.  Was evaluated for this earlier this week.  Hurts more with breathing.  Also has sharp pains at times in lower pelvis.  Loose stools for several weeks. . She reports good fetal movement, denies LOF, vaginal bleeding, vaginal itching/burning, urinary symptoms, h/a, dizziness, n/v, constipation or fever/chills.    Abdominal Pain This is a recurrent problem. The problem occurs intermittently. The problem has been unchanged. The pain is located in the RUQ. The abdominal pain radiates to the RLQ. Associated symptoms include diarrhea. Pertinent negatives include no constipation, fever, frequency or headaches. The pain is aggravated by deep breathing. The pain is relieved by nothing. She has tried nothing for the symptoms.    RN Note: Having pain RUQ that goes down to lower abd. Was here few days ago with same pain but was told was not ctxs. Pain is worse when breathes in. Denies VB or LOF. Decreased FM since yesterday. Stools loose than normal for a day. No n/v. Ate plain hot dog at 2215  Past Medical History: Past Medical History:  Diagnosis Date  . ADHD (attention deficit hyperactivity disorder)   . Anxiety   . Depression   . Gestational diabetes   . Gonorrhea 02/02/2018  . Headache     Past obstetric history: OB History  Gravida Para Term Preterm AB Living  2       1 0  SAB IAB Ectopic Multiple Live Births  1            # Outcome Date GA Lbr Len/2nd Weight Sex Delivery Anes PTL Lv  2 Current           1 SAB 08/03/19 [redacted]w[redacted]d    SAB       Past Surgical History: History reviewed. No pertinent surgical history.  Family History: Family History  Problem Relation Age of Onset  . Healthy Mother   .  Healthy Father     Social History: Social History   Tobacco Use  . Smoking status: Never Smoker  . Smokeless tobacco: Never Used  Vaping Use  . Vaping Use: Never used  Substance Use Topics  . Alcohol use: Not Currently  . Drug use: Not Currently    Types: Marijuana    Comment: used this week    Allergies:  Allergies  Allergen Reactions  . Nickel Rash    Meds:  Medications Prior to Admission  Medication Sig Dispense Refill Last Dose  . aspirin EC 81 MG tablet Take 1 tablet (81 mg total) by mouth daily. 60 tablet 2 10/23/2020 at Unknown time  . cyclobenzaprine (FLEXERIL) 10 MG tablet Take 10 mg by mouth 3 (three) times daily as needed for muscle spasms.   Past Week at Unknown time  . loperamide (IMODIUM) 2 MG capsule Take 1 capsule (2 mg total) by mouth 4 (four) times daily as needed for diarrhea or loose stools. 12 capsule 0 Past Month at Unknown time  . Prenat w/o A Vit-FeFum-FePo-FA (CONCEPT OB) 130-92.4-1 MG CAPS Take 1 capsule by mouth daily. 30 capsule 11 10/23/2020 at Unknown time  . Accu-Chek Softclix Lancets lancets Use as instructed 100 each 12   . albuterol (VENTOLIN HFA) 108 (90 Base) MCG/ACT inhaler Inhale 1-2  puffs into the lungs every 6 (six) hours as needed for wheezing or shortness of breath. 18 g 0   . Blood Glucose Monitoring Suppl (ACCU-CHEK GUIDE) w/Device KIT 1 each by Does not apply route 4 (four) times daily. 1 kit 0   . Blood Pressure Monitoring (BLOOD PRESSURE MONITOR AUTOMAT) DEVI 1 Device by Does not apply route daily. Automatic blood pressure cuff large size. To monitor blood pressure regularly at home. ICD-10 code:Z34.90 1 each 0   . Doxylamine-Pyridoxine 10-10 MG TBEC Take 2 tablets by mouth at bedtime. (Patient not taking: Reported on 09/26/2020) 60 tablet 2   . fluticasone (FLONASE) 50 MCG/ACT nasal spray Place 1-2 sprays into both nostrils daily for 7 days. 1 g 0   . glucose blood (ACCU-CHEK GUIDE) test strip 1 each by Other route in the morning, at  noon, in the evening, and at bedtime. Use as instructed 100 each 12   . metoCLOPramide (REGLAN) 10 MG tablet Take 1 tablet (10 mg total) by mouth every 8 (eight) hours as needed (take with tylenol for headaches). (Patient not taking: Reported on 09/26/2020) 30 tablet 0   . Misc. Devices (GOJJI WEIGHT SCALE) MISC 1 Device by Does not apply route daily as needed. To weight self daily as needed at home. ICD-10 code: Z34.90 1 each 0   . ondansetron (ZOFRAN ODT) 4 MG disintegrating tablet Take 1 tablet (4 mg total) by mouth every 8 (eight) hours as needed for nausea or vomiting. (Patient not taking: Reported on 09/26/2020) 15 tablet 0     I have reviewed patient's Past Medical Hx, Surgical Hx, Family Hx, Social Hx, medications and allergies.   ROS:  Review of Systems  Constitutional: Negative for fever.  Gastrointestinal: Positive for abdominal pain and diarrhea. Negative for constipation.  Genitourinary: Negative for frequency.  Neurological: Negative for headaches.   Other systems negative  Physical Exam   Patient Vitals for the past 24 hrs:  BP Temp Pulse Resp SpO2 Height Weight  10/24/20 0611 102/69 98 F (36.7 C) (!) 104 18 99 % $Re'5\' 5"'ugf$  (1.651 m) 101.2 kg   Constitutional: Well-developed, well-nourished female in no acute distress.  Cardiovascular: normal rate and rhythm Respiratory: normal effort, clear to auscultation bilaterally GI: Abd soft, tender over RUQ (?[place]), gravid appropriate for gestational age.   No rebound or guarding. MS: Extremities nontender, no edema, normal ROM Neurologic: Alert and oriented x 4.  GU: Neg CVAT. Pelvic:  FFn sent.  Cervix long and closed   FHT:  Baseline 130 , moderate variability, accelerations present, no decelerations Contractions: Intermittent uterine cramps lasting 10 seconds each   Labs: Labs ordered A/Positive/-- (01/07 1032)  Imaging:  No results found.  MAU Course/MDM: I have ordered labs to evaluate for cholecystitis NST  reviewed, reassuring. Consult Dr Dione Plover (who will assume care) with presentation, exam findings and test results.    Assessment: Single IUP at [redacted]w[redacted]d RUQ pain Pelvic cramping  Plan:  Fetal fibronectin sent Consider RUQ ultrasound Report given to Dr Dione Plover  Hansel Feinstein CNM, MSN Certified Nurse-Midwife 10/24/2020 7:12 AM    On re-evaluation patient reports continues to have vaginal pressure/pain and sharp RUQ pain with inspiration. BSUS shows infant in transverse position with head to maternal R, limited gallbladder US shows no evidence of stones. Labs reviewed and are unremarkable. Discussed with patient given benign workup, vaginal discomfort most likely due to advancing gestational age. RUQ pain most likely MSK in etiology. Recommended tylenol and flexeril. FHT reviewed and is  Cat I. Patient in agreement with plan. Discharged to home in stable condition.  8:59 AM Clarnce Flock, MD/MPH Attending Family Medicine Physician, [facility], [place]

## 2020-10-24 NOTE — MAU Note (Addendum)
Having pain RUQ that goes down to lower abd. Was here few days ago with same pain but was told was not ctxs. Pain is worse when breathes in. Denies VB or LOF. Decreased FM since yesterday. Stools loose than normal for a day. No n/v. Ate plain hot dog at 2215

## 2020-10-24 NOTE — Progress Notes (Signed)
FFN collected by CNM by blind swab

## 2020-10-29 ENCOUNTER — Encounter: Payer: Self-pay | Admitting: Physical Therapy

## 2020-10-29 ENCOUNTER — Other Ambulatory Visit: Payer: Self-pay

## 2020-10-29 ENCOUNTER — Encounter: Payer: Medicaid Other | Attending: Women's Health | Admitting: Physical Therapy

## 2020-10-29 DIAGNOSIS — R102 Pelvic and perineal pain: Secondary | ICD-10-CM | POA: Diagnosis not present

## 2020-10-29 DIAGNOSIS — M6281 Muscle weakness (generalized): Secondary | ICD-10-CM | POA: Diagnosis present

## 2020-10-29 DIAGNOSIS — M62838 Other muscle spasm: Secondary | ICD-10-CM | POA: Diagnosis not present

## 2020-10-29 NOTE — Therapy (Signed)
[facility] at [facility] [address], [address] [place], [place], [address] Phone: [phone]   Fax:  [phone]  Physical Therapy Evaluation  Patient Details  Name: Bethany Avery MRN: [id number] Date of Birth: [birth year] Referring Provider (PT): Donia Ast, NP   Encounter Date: 10/29/2020   PT End of Session - 10/29/20 0922    Visit Number 1    Date for PT Re-Evaluation 12/10/20    Authorization Type [employer]    PT Start Time 4562    PT Stop Time 0920    PT Time Calculation (min) 42 min    Activity Tolerance Patient tolerated treatment well;Patient limited by pain    Behavior During Therapy [facility] for tasks assessed/performed           Past Medical History:  Diagnosis Date  . ADHD (attention deficit hyperactivity disorder)   . Anxiety   . Depression   . Gestational diabetes   . Gonorrhea 02/02/2018  . Headache     History reviewed. No pertinent surgical history.  There were no vitals filed for this visit.    Subjective Assessment - 10/29/20 0840    Subjective Patient reports her pain started in second trimester.  She has pain suprapubic area. Patient has a maternity belt.    Currently in Pain? Yes    Pain Score 9     Pain Location Pelvis    Pain Orientation Mid    Pain Descriptors / Indicators Pressure    Pain Type Acute pain    Pain Onset More than a month ago    Pain Frequency Intermittent    Aggravating Factors  turning in bed, laying down in bed, sitting down, putting pants on, lifting up on leg in standing, walking, getting out of bed    Pain Relieving Factors not sure    Multiple Pain Sites No              [facility] PT Assessment - 10/29/20 0001      Assessment   Medical Diagnosis O26.892, R10.2 Pelvic pain affecting pregnancy in second trimester, antepartum    Referring Provider (PT) Donia Ast, NP    Onset Date/Surgical Date --   06/2020; due date 12/16/2020   Prior Therapy none       Precautions   Precautions Other (comment)    Precaution Comments pregnant and due  12/16/2020      Restrictions   Weight Bearing Restrictions No      Balance Screen   Has the patient fallen in the past 6 months No    Has the patient had a decrease in activity level because of a fear of falling?  No    Is the patient reluctant to leave their home because of a fear of falling?  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 due to the medication she is taking for the pain      Cognition   Overall Cognitive Status Within Functional Limits for tasks assessed      Observation/Other Assessments   Focus on Therapeutic Outcomes (FOTO)  [id number]      Posture/Postural Control   Posture/Postural Control Postural limitations    Posture Comments pregnancy posture      ROM / Strength  AROM / PROM / Strength AROM;PROM;Strength      AROM   Overall AROM Comments full with pain and when comes to neutral will shift      PROM   Right Hip Flexion 110    Right Hip External Rotation  45    Left Hip Flexion 120    Left Hip External Rotation  45      Strength   Right Hip Extension 4/5    Right Hip ABduction 3/5   pain   Right Hip ADduction 4/5    Left Hip Extension 4/5    Left Hip ABduction 3/5   pain   Left Hip ADduction 4/5      Palpation   Spinal mobility tightness in the L4-S1 vertebrae    SI assessment  ASIS are equal    Palpation comment tenderness located in right hip adductors, lumbar paraspinals, right levator ani and obturator internist                      Objective measurements completed on examination: See above findings.     Pelvic Floor Special Questions - 10/29/20 0001    Prior Pregnancies Yes    Number of Pregnancies 2   1 prior that was a miscarriage and presently pregnant    Currently Sexually Active Yes    Is this Painful Yes   presently stopped due to pain afterwards   Urinary Leakage No    Fecal incontinence No            [facility] Adult PT Treatment/Exercise - 10/29/20 0001      Lumbar Exercises: Sidelying   Other Sidelying Lumbar Exercises sidely iliacus pull backs 15x in left sdiely then pelvic tilts to contract the lower abdominals                  PT Education - 10/29/20 0922    Education Details iliacus pullbacks, pelvic tilt    Person(s) Educated Patient    Methods Explanation;Demonstration;Handout    Comprehension Verbalized understanding;Returned demonstration            PT Short Term Goals - 10/29/20 1031      PT SHORT TERM GOAL #1   Title independent with initial HEP    Baseline not educated yet    Time 3    Period Weeks    Status New    Target Date 11/19/20             PT Long Term Goals - 10/29/20 1031      PT LONG TERM GOAL #1   Title independent iwth advanced HEP for pain management    Time 6    Period Weeks    Status New    Target Date 12/10/20      PT LONG TERM GOAL #2   Title able to perform transitional movements  with correct body mechanics and breath to reduce strain on her pelvis pelvis    Baseline not educated yet    Time 6    Period Weeks    Status New    Target Date 12/10/20      PT LONG TERM GOAL #3   Title reduction of trigger points in the hip adductors, pelvic floor muscles, and lumbar to reduce her pain to a manageable level </= 5/10    Baseline pain level 9/10    Time 6    Period Weeks    Status New    Target Date 12/10/20  PT LONG TERM GOAL #4   Title understnad ways to give birth to reduce the strain on her pelvis and back with correct breath and different positions    Baseline not educated yet    Time 6    Period Weeks    Status New    Target Date 12/10/20                  Plan - 10/29/20 0923    Clinical Impression Statement Patient is a 20 year old with  pelvic pain since 06/2020. She is due on 12/16/2020. Patient reports her pain is intermittent at level 9/10 with walking, moving in bed, laying down, sitting, and getting out of bed. Patient has stopped having penile penetration vaginally due to the pain in the pelvic area afterwards. Pelvis is in correct alignment. She has full lumbar ROM but is painful and will shift in the lumbar area when she comes back into nuetral. She ahs tenderenss located in the lumbar paraspinals, right hip adductors, right levator ani, obturator internist, and pubic bone. Bilateral hip abduction, adduction and extension is weak and painful. Her right hip flexion passively is 110 degrees and external rotation is 45 degrees. She reports no urinary leakage or fecal leadage. Patient needs verbal cues when moving in bed to reduce her pain and engage her core with her legs together. Patient will benefit from skilled therapy to reduce her pain and assist in manage ment of pain as her pregnancy progreseses.    Personal Factors and Comorbidities Comorbidity 1;Sex    Comorbidities presently pregnant and due 7/25/20222    Examination-Activity Limitations Bathing;Bed Mobility;Sit;Bend;Squat;Carry;Stairs;Stand;Dressing;Transfers;Locomotion Level    Examination-Participation Restrictions Community Activity;Interpersonal Relationship    Stability/Clinical Decision Making Evolving/Moderate complexity    Clinical Decision Making Low    Rehab Potential Good    PT Frequency 1x / week    PT Duration 6 weeks    PT Treatment/Interventions ADLs/Self Care Home Management;Cryotherapy;Moist Heat;Therapeutic activities;Therapeutic exercise;Neuromuscular re-education;Patient/family education;Manual techniques;Energy conservation;Spinal Manipulations;Joint Manipulations    PT Next Visit Plan work on pelvic muscle spasms, press on pubic bone to release th e puborectalis; gluteal squeezes, hamstring in sitting, trunk twis in sidely, right hip mobilization     Consulted and Agree with Plan of Care Patient           Patient will benefit from skilled therapeutic intervention in order to improve the following deficits and impairments:  Decreased endurance,Decreased activity tolerance,Decreased strength,Increased fascial restricitons,Pain,Increased muscle spasms,Decreased range of motion,Decreased coordination  Visit Diagnosis: Muscle weakness (generalized) - Plan: PT plan of care cert/re-cert  Other muscle spasm - Plan: PT plan of care cert/re-cert  Pelvic pain - Plan: PT plan of care cert/re-cert     Problem List Patient Active Problem List   Diagnosis Date Noted  . Gestational diabetes mellitus, class A2 09/28/2020  . Carrier of spinal muscular atrophy 07/03/2020  . Elevated BP without diagnosis of hypertension 07/03/2020  . Headache in pregnancy, antepartum, second trimester 07/03/2020  . Supervision of high risk pregnancy, antepartum 05/31/2020  . History of depression 05/31/2020  . Obesity in pregnancy, antepartum 05/31/2020  . Adjustment disorder with mixed anxiety and depressed mood 04/02/2020  . Marijuana user 06/22/2018  . Adolescent behavior problem   . Attention deficit hyperactivity disorder 08/24/2013    Eulis FosterCheryl Jourdin Gens, PT 10/29/20 10:39 AM   [place] [facility] at [facility] [address], [address] [place], [place], [id number] Phone: [phone]   Fax:  [phone]  Name: Bethany Kidneyshanti  Arlita Avery MRN: [id number] Date of Birth: [birth year]

## 2020-10-30 ENCOUNTER — Telehealth: Payer: Self-pay | Admitting: Obstetrics and Gynecology

## 2020-10-30 ENCOUNTER — Ambulatory Visit: Payer: Medicaid Other | Attending: Obstetrics and Gynecology

## 2020-10-30 ENCOUNTER — Ambulatory Visit: Payer: Medicaid Other | Admitting: *Deleted

## 2020-10-30 ENCOUNTER — Other Ambulatory Visit: Payer: Self-pay | Admitting: *Deleted

## 2020-10-30 ENCOUNTER — Encounter: Payer: Self-pay | Admitting: *Deleted

## 2020-10-30 ENCOUNTER — Other Ambulatory Visit: Payer: Self-pay | Admitting: Obstetrics and Gynecology

## 2020-10-30 DIAGNOSIS — O0990 Supervision of high risk pregnancy, unspecified, unspecified trimester: Secondary | ICD-10-CM

## 2020-10-30 DIAGNOSIS — O24419 Gestational diabetes mellitus in pregnancy, unspecified control: Secondary | ICD-10-CM | POA: Diagnosis present

## 2020-10-30 DIAGNOSIS — N898 Other specified noninflammatory disorders of vagina: Secondary | ICD-10-CM

## 2020-10-30 DIAGNOSIS — B9689 Other specified bacterial agents as the cause of diseases classified elsewhere: Secondary | ICD-10-CM

## 2020-10-30 MED ORDER — TERCONAZOLE 0.4 % VA CREA: 1.0000 | TOPICAL_CREAM | Freq: Every day | VAGINAL | 0 refills | Status: DC

## 2020-10-30 MED ORDER — METRONIDAZOLE 500 MG PO TABS: 500.0000 mg | ORAL_TABLET | Freq: Two times a day (BID) | ORAL | 0 refills | Status: DC

## 2020-10-30 NOTE — Progress Notes (Signed)
Patient seen at MAU on 10/24/20 and informed that she has BV. No medication was sent to pharmacy. Patient called complaining of vaginal itching as well today. Metronidazole 500 mg BID x 7 day and Terazole vag cream sent to pharmacy.  Clovis Pu, RN

## 2020-10-30 NOTE — Telephone Encounter (Signed)
Patient called and stated she was seen in the hospital on 06/02, and was told she had BV. She wanted to know who was going to call her in something. She stated she is very itchy, and swollen. She is requesting a call back for advice on what to do.

## 2020-10-31 ENCOUNTER — Other Ambulatory Visit: Payer: Self-pay | Admitting: *Deleted

## 2020-10-31 DIAGNOSIS — O24415 Gestational diabetes mellitus in pregnancy, controlled by oral hypoglycemic drugs: Secondary | ICD-10-CM

## 2020-11-04 ENCOUNTER — Other Ambulatory Visit: Payer: Self-pay | Admitting: *Deleted

## 2020-11-04 MED ORDER — CYCLOBENZAPRINE HCL 10 MG PO TABS: 10.0000 mg | ORAL_TABLET | Freq: Three times a day (TID) | ORAL | 0 refills | Status: DC | PRN

## 2020-11-05 ENCOUNTER — Other Ambulatory Visit: Payer: Self-pay

## 2020-11-05 ENCOUNTER — Inpatient Hospital Stay (HOSPITAL_COMMUNITY)
Admission: AD | Admit: 2020-11-05 | Discharge: 2020-11-05 | Disposition: A | Discharge status: Home / Self Care | Payer: Medicaid Other | Attending: Obstetrics and Gynecology | Admitting: Obstetrics and Gynecology

## 2020-11-05 ENCOUNTER — Encounter (HOSPITAL_COMMUNITY): Payer: Self-pay | Admitting: Obstetrics and Gynecology

## 2020-11-05 ENCOUNTER — Telehealth: Payer: Self-pay | Admitting: Obstetrics and Gynecology

## 2020-11-05 ENCOUNTER — Encounter: Payer: Medicaid Other | Admitting: Physical Therapy

## 2020-11-05 DIAGNOSIS — Z3689 Encounter for other specified antenatal screening: Secondary | ICD-10-CM

## 2020-11-05 DIAGNOSIS — M6281 Muscle weakness (generalized): Secondary | ICD-10-CM | POA: Diagnosis not present

## 2020-11-05 DIAGNOSIS — O98813 Other maternal infectious and parasitic diseases complicating pregnancy, third trimester: Secondary | ICD-10-CM | POA: Insufficient documentation

## 2020-11-05 DIAGNOSIS — O24415 Gestational diabetes mellitus in pregnancy, controlled by oral hypoglycemic drugs: Secondary | ICD-10-CM | POA: Diagnosis not present

## 2020-11-05 DIAGNOSIS — Z7982 Long term (current) use of aspirin: Secondary | ICD-10-CM | POA: Insufficient documentation

## 2020-11-05 DIAGNOSIS — Z3A33 33 weeks gestation of pregnancy: Secondary | ICD-10-CM | POA: Diagnosis not present

## 2020-11-05 DIAGNOSIS — O26893 Other specified pregnancy related conditions, third trimester: Secondary | ICD-10-CM | POA: Diagnosis present

## 2020-11-05 DIAGNOSIS — Z0371 Encounter for suspected problem with amniotic cavity and membrane ruled out: Secondary | ICD-10-CM

## 2020-11-05 DIAGNOSIS — B373 Candidiasis of vulva and vagina: Secondary | ICD-10-CM | POA: Insufficient documentation

## 2020-11-05 DIAGNOSIS — O24419 Gestational diabetes mellitus in pregnancy, unspecified control: Secondary | ICD-10-CM

## 2020-11-05 DIAGNOSIS — R102 Pelvic and perineal pain: Secondary | ICD-10-CM

## 2020-11-05 DIAGNOSIS — O0990 Supervision of high risk pregnancy, unspecified, unspecified trimester: Secondary | ICD-10-CM

## 2020-11-05 DIAGNOSIS — M62838 Other muscle spasm: Secondary | ICD-10-CM

## 2020-11-05 DIAGNOSIS — B3731 Acute candidiasis of vulva and vagina: Secondary | ICD-10-CM

## 2020-11-05 LAB — WET PREP, GENITAL
Clue Cells Wet Prep HPF POC: NONE SEEN
Sperm: NONE SEEN
Trich, Wet Prep: NONE SEEN

## 2020-11-05 LAB — POCT FERN TEST: POCT Fern Test: NEGATIVE

## 2020-11-05 NOTE — Patient Instructions (Signed)
Access Code: [email or web address] URL: [email or web address] Date: 11/05/2020 Prepared by: Eulis Foster  Exercises Sidelying Pelvic Floor Contraction with Self-Palpation - 2 x daily - 7 x weekly - 1 sets - 10 reps - 5 sec hold Sidelying Open Book Thoracic Rotation with Knee on Foam Roll - 1 x daily - 7 x weekly - 1 sets - 10 reps  Eulis Foster, PT [facility] [address], [address] [place], [place] [address] W: [phone] [email or web address]

## 2020-11-05 NOTE — Telephone Encounter (Signed)
Received a call from the patient stating every time she goes to the bathroom her panties are wet. She stated even when she is lying down she can feel wetness in her underwear. I informed her there was not a nurse in the office today, and I would make a call then call her back with what to do. Called [facility]U to speak to a Midwife. Spoke to [employer], and she informed me to call the patient back and have her come in to be examined. The patient stated she would go to the hospital.

## 2020-11-05 NOTE — MAU Provider Note (Signed)
Event Date/Time   First Provider Initiated Contact with Patient 11/05/20 1040       S: [name] is a 20 y.o. G2P0010 at [redacted]w[redacted]d  who presents to MAU today complaining of leaking of fluid since 2 weeks. Patient reports small amounts of vaginal discharge that are clear/yellow in color that sometimes soak through to her pants. Patient denies wearing a pad, denies any large gushes of fluid. She denies vaginal bleeding. She denies contractions. She reports normal fetal movement.    O: BP 138/65   Pulse (!) 102   Temp 97.9 F (36.6 C) (Oral)   Resp 20   Ht 5\' 5"  (1.651 m)   Wt 104.1 kg   LMP 03/07/2020   SpO2 100%   BMI 38.17 kg/m   Patient Vitals for the past 24 hrs:  BP Temp Temp src Pulse Resp SpO2 Height Weight  11/05/20 1222 138/65 -- -- (!) 102 -- -- -- --  11/05/20 1220 -- -- -- -- -- 100 % -- --  11/05/20 1026 132/75 -- -- 96 -- -- -- --  11/05/20 0956 129/75 97.9 F (36.6 C) Oral 96 20 100 % -- --  11/05/20 0952 -- -- -- -- -- -- 5\' 5"  (1.651 m) 104.1 kg   GENERAL: Well-developed, well-nourished female in no acute distress.  HEAD: Normocephalic, atraumatic.  CHEST: Normal effort of breathing, regular heart rate ABDOMEN: Soft, nontender, gravid PELVIC: Normal external female genitalia. Vagina is pink/red and rugated. Cervix pink with normal contour, no lesions. Copious amount of yeast discharge present in vagina and on face of cervix.  No pooling or leaking of fluid from cervical os.11/07/20: negative    Fetal Monitoring: reactive Baseline: 140 Variability: moderate Accelerations: present, 15x15 Decelerations: absent (apparent decel on monitor tracing maternal, not fetal HR) Contractions: irritability  Results for orders placed or performed during the hospital encounter of 11/05/20 (from the past 24 hour(s))  Fern Test     Status: None   Collection Time: 11/05/20 11:45 AM  Result Value Ref Range   POCT Fern Test Negative = intact amniotic membranes    Wet prep, genital     Status: Abnormal   Collection Time: 11/05/20 11:46 AM   Specimen: Vaginal  Result Value Ref Range   Yeast Wet Prep HPF POC PRESENT (A) NONE SEEN   Trich, Wet Prep NONE SEEN NONE SEEN   Clue Cells Wet Prep HPF POC NONE SEEN NONE SEEN   WBC, Wet Prep HPF POC MANY (A) NONE SEEN   Sperm NONE SEEN      A: SIUP at [redacted]w[redacted]d  Membranes intact Vaginal yeast  P: Pt already taking treatment for BV and was given terconazole for yeast. Patient advised to start terconazole today and take as prescribed. Discussed s/sx of PPROM and when to return to MAU Return MAU precautions given Pt discharged to home in stable condition  Laverda Stribling, 11/07/20, NP 11/05/2020 1:25 PM

## 2020-11-05 NOTE — MAU Provider Note (Addendum)
History     CSN: [id number]  Arrival date and time: 11/05/20 0934   None     Chief Complaint  Patient presents with   Rupture of Membranes   HPI Bethany Avery is a 20 year old African American female G2P0 currently 33 weeks 6 days gestation presenting to MAU for evaluation of "leaking fluid" that she has been experiencing for 2 weeks. Fluid is described as thin, watery, clear/yellow, odorless, and constantly leaks. States she notices fluid every time she goes to the bathroom and sometimes fluid is enough to leak through underwear and pants. She does not wear peri pads, but has considered purchasing adult depends to manage leaking fluid. Denies vaginal bleeding. Last date of protected sexual intercourse 2 days ago.   Reports normal fetal movement. Thinks she's having braxton hicks contractions. States they feel like a "bolt of pain in her belly for a couple seconds to a minute and then it stops." Gets one every couple of hours during the day.   Recently diagnosed with BV and vaginal yeast infection. Started taking metronidazole 6 days ago for BV. Instructed by provider to finish metronidazole prior to starting intravaginal terconazole for yeast infection. Understands correct method for terconazole administration and states she will start this medication tomorrow. States vaginal itching has cleared up since taking antibiotic.    OB History     Gravida  2   Para      Term      Preterm      AB  1   Living  0      SAB  1   IAB      Ectopic      Multiple      Live Births              Past Medical History:  Diagnosis Date   ADHD (attention deficit hyperactivity disorder)    Anxiety    Depression    Gestational diabetes    Gonorrhea 02/02/2018   Headache     No past surgical history on file.  Family History  Problem Relation Age of Onset   Healthy Mother    Healthy Father     Social History   Tobacco Use   Smoking status: Never   Smokeless tobacco: Never   Vaping Use   Vaping Use: Never used  Substance Use Topics   Alcohol use: Not Currently   Drug use: Not Currently    Types: Marijuana    Comment: used this week    Allergies:  Allergies  Allergen Reactions   Nickel Rash    Medications Prior to Admission  Medication Sig Dispense Refill Last Dose   Accu-Chek Softclix Lancets lancets Use as instructed 100 each 12    albuterol (VENTOLIN HFA) 108 (90 Base) MCG/ACT inhaler Inhale 1-2 puffs into the lungs every 6 (six) hours as needed for wheezing or shortness of breath. 18 g 0    aspirin EC 81 MG tablet Take 1 tablet (81 mg total) by mouth daily. 60 tablet 2    Blood Glucose Monitoring Suppl (ACCU-CHEK GUIDE) w/Device KIT 1 each by Does not apply route 4 (four) times daily. 1 kit 0    Blood Pressure Monitoring (BLOOD PRESSURE MONITOR AUTOMAT) DEVI 1 Device by Does not apply route daily. Automatic blood pressure cuff large size. To monitor blood pressure regularly at home. ICD-10 code:Z34.90 1 each 0    cyclobenzaprine (FLEXERIL) 10 MG tablet Take 1 tablet (10 mg total) by mouth 3 (  three) times daily as needed for muscle spasms. 30 tablet 0    Doxylamine-Pyridoxine 10-10 MG TBEC Take 2 tablets by mouth at bedtime. (Patient not taking: Reported on 09/26/2020) 60 tablet 2    fluticasone (FLONASE) 50 MCG/ACT nasal spray Place 1-2 sprays into both nostrils daily for 7 days. 1 g 0    glucose blood (ACCU-CHEK GUIDE) test strip 1 each by Other route in the morning, at noon, in the evening, and at bedtime. Use as instructed 100 each 12    loperamide (IMODIUM) 2 MG capsule Take 1 capsule (2 mg total) by mouth 4 (four) times daily as needed for diarrhea or loose stools. 12 capsule 0    metFORMIN (GLUCOPHAGE) 500 MG tablet Take 1 tablet (500 mg total) by mouth at bedtime. 30 tablet 3    metoCLOPramide (REGLAN) 10 MG tablet Take 1 tablet (10 mg total) by mouth every 8 (eight) hours as needed (take with tylenol for headaches). (Patient not taking: Reported on  09/26/2020) 30 tablet 0    metroNIDAZOLE (FLAGYL) 500 MG tablet Take 1 tablet (500 mg total) by mouth 2 (two) times daily. 14 tablet 0    Misc. Devices (GOJJI WEIGHT SCALE) MISC 1 Device by Does not apply route daily as needed. To weight self daily as needed at home. ICD-10 code: Z34.90 1 each 0    ondansetron (ZOFRAN ODT) 4 MG disintegrating tablet Take 1 tablet (4 mg total) by mouth every 8 (eight) hours as needed for nausea or vomiting. (Patient not taking: Reported on 09/26/2020) 15 tablet 0    Prenat w/o A Vit-FeFum-FePo-FA (CONCEPT OB) 130-92.4-1 MG CAPS Take 1 capsule by mouth daily. 30 capsule 11    terconazole (TERAZOL 7) 0.4 % vaginal cream Place 1 applicator vaginally at bedtime. 45 g 0     Review of Systems Physical Exam   Blood pressure 129/75, pulse 96, temperature 97.9 F (36.6 C), temperature source Oral, resp. rate 20, height $RemoveBe'5\' 5"'ePRtvfQjF$  (1.651 m), weight 104.1 kg, last menstrual period 03/07/2020, SpO2 100 %.  Physical Exam Vitals and nursing note reviewed.  Constitutional:      General: She is not in acute distress.    Appearance: Normal appearance. She is not ill-appearing.  Cardiovascular:     Rate and Rhythm: Normal rate and regular rhythm.     Pulses: Normal pulses.     Heart sounds: Normal heart sounds. No murmur heard.   No friction rub. No gallop.  Pulmonary:     Effort: Pulmonary effort is normal.     Breath sounds: Normal breath sounds.  Chest:     Chest wall: No tenderness.  Abdominal:     General: Bowel sounds are normal.     Palpations: Abdomen is soft. There is no mass.     Tenderness: There is no abdominal tenderness.  Genitourinary:    Pubic Area: No rash.      Vagina: Vaginal discharge present. No bleeding.     Comments: Erythema to vaginal wall present. Copious amount of thick, yellow/white, curd-like vaginal discharge present consistent with yeast infection.  Skin:    General: Skin is warm and dry.  Neurological:     General: No focal deficit  present.     Mental Status: She is alert and oriented to person, place, and time. Mental status is at baseline.  Psychiatric:        Mood and Affect: Mood normal.        Behavior: Behavior normal.    MAU  Course  Procedures Results for orders placed or performed during the hospital encounter of 11/05/20 (from the past 24 hour(s))  Fern Test     Status: None   Collection Time: 11/05/20 11:45 AM  Result Value Ref Range   POCT Fern Test Negative = intact amniotic membranes   Wet prep, genital     Status: Abnormal   Collection Time: 11/05/20 11:46 AM   Specimen: Vaginal  Result Value Ref Range   Yeast Wet Prep HPF POC PRESENT (A) NONE SEEN   Trich, Wet Prep NONE SEEN NONE SEEN   Clue Cells Wet Prep HPF POC NONE SEEN NONE SEEN   WBC, Wet Prep HPF POC MANY (A) NONE SEEN   Sperm NONE SEEN      MDM  Sterile speculum exam:  - thick, white, curd-like vaginal discharge seen on sterile speculum exam consistent with a vaginal yeast infection - vaginal wall erythematous and irritated  Microscopic exam of vaginal fluid:  - multiple budding yeast seen - no ferning and no signs of amniotic fluid leak   Assessment and Plan   1. Supervision of high risk pregnancy, antepartum   2. Gestational diabetes mellitus, class A2   3. Vaginal yeast infection   4. [redacted] weeks gestation of pregnancy   5. NST (non-stress test) reactive     Patient instructed to continue routine care with outpatient OB/GYN and to come back to MAU for any pregnancy related emergency concerns.  Instructed to start terconazole intravaginal medication tonight to treat yeast infection and manage blood sugar.   Santina Evans 11/05/2020, 10:15 AM       I supervised the student during this patient encounter. Please see provider note for complete documentation.   Clarisa Fling, NP 11/05/2020 4:33 PM

## 2020-11-05 NOTE — Therapy (Signed)
[facility] at [facility] [address] [place], [place], [address] Phone: [phone]   Fax:  [phone]  Physical Therapy Treatment  Patient Details  Name: Bethany Avery MRN: [id number] Date of Birth: May 16, 2001 Referring Provider (PT): Donia Ast, NP   Encounter Date: 11/05/2020   PT End of Session - 11/05/20 0840     Visit Number 2    Date for PT Re-Evaluation 12/10/20    Authorization Type [employer]    Authorization Time Period 10/29/20-01/07/2021    Authorization - Visit Number 1    Authorization - Number of Visits 10    PT Start Time [phone]    PT Stop Time 0920    PT Time Calculation (min) 42 min    Activity Tolerance Patient tolerated treatment well;Patient limited by pain    Behavior During Therapy [facility] for tasks assessed/performed             Past Medical History:  Diagnosis Date   ADHD (attention deficit hyperactivity disorder)    Anxiety    Depression    Gestational diabetes    Gonorrhea 02/02/2018   Headache     No past surgical history on file.  There were no vitals filed for this visit.   Subjective Assessment - 11/05/20 0841     Subjective I have been in alot of pain and difficulty getting out of bed. I have been doing the hip exercises.    Patient Stated Goals reduce pain    Currently in Pain? Yes    Pain Score 5     Pain Location Pelvis    Pain Orientation Mid    Pain Descriptors / Indicators Pressure    Pain Type Acute pain    Pain Onset More than a month ago    Pain Frequency Intermittent    Aggravating Factors  turning in bed, laying down in bed, sitting down, putting pants on, lifting up on leg in standing, walking, getting out of bed    Pain Relieving Factors not sure    Multiple Pain Sites Yes    Pain Score 5    Pain Location Vagina    Pain Orientation Right;Lower    Pain Type Acute pain    Pain Onset More than a month ago    Pain Frequency Intermittent    Aggravating  Factors  when using the bathroom feels like she has to push and nothing is to come out    Pain Relieving Factors not going to the bathroom                               [facility] Adult PT Treatment/Exercise - 11/05/20 0001       Self-Care   Self-Care Other Self-Care Comments    Other Self-Care Comments  educated patient on using coconut oil on the perineal area and nipple area for the dryness with breast feeding; how to make padsicles for the perineal area for after birht      Therapeutic Activites    Therapeutic Activities Other Therapeutic Activities    Other Therapeutic Activities how to place a garbage bag on the car seat to  assist her on sliding on the seat; breathing out wiht movement in the bed and getting up to keep pressure off the pelvic floor; how to contract the pelvic floor after urinating or bowel movement to reduce the pressure      Lumbar Exercises: Sidelying  Other Sidelying Lumbar Exercises open book 10x each side with breath to open up the rib cage    Other Sidelying Lumbar Exercises sidely pelvic floor contraction holding for 5 seconds 10x      Manual Therapy   Manual Therapy Soft tissue mobilization    Manual therapy comments educated patient on how to teach the babys dad    Soft tissue mobilization in sidely pressing on the pubic bone and sacrum to relax the puborectalis  on the right side and left side feeling the release of the muscles; mnaual mobilization to bilateral ischiocavernosus and educated pateint on how to perform on herself to reduce her pain                    PT Education - 11/05/20 0921     Education Details Access Code: [email or web address]; movement in bed, how to get out of car, making padsicles; using cocnut oil for the nipples and perineal area after the baby    Person(s) Educated Patient    Methods Explanation;Demonstration;Verbal cues;Handout    Comprehension Verbalized understanding;Returned demonstration               PT Short Term Goals - 10/29/20 1031       PT SHORT TERM GOAL #1   Title independent with initial HEP    Baseline not educated yet    Time 3    Period Weeks    Status New    Target Date 11/19/20               PT Long Term Goals - 10/29/20 1031       PT LONG TERM GOAL #1   Title independent iwth advanced HEP for pain management    Time 6    Period Weeks    Status New    Target Date 12/10/20      PT LONG TERM GOAL #2   Title able to perform transitional movements  with correct body mechanics and breath to reduce strain on her pelvis pelvis    Baseline not educated yet    Time 6    Period Weeks    Status New    Target Date 12/10/20      PT LONG TERM GOAL #3   Title reduction of trigger points in the hip adductors, pelvic floor muscles, and lumbar to reduce her pain to a manageable level </= 5/10    Baseline pain level 9/10    Time 6    Period Weeks    Status New    Target Date 12/10/20      PT LONG TERM GOAL #4   Title understnad ways to give birth to reduce the strain on her pelvis and back with correct breath and different positions    Baseline not educated yet    Time 6    Period Weeks    Status New    Target Date 12/10/20                   Plan - 11/05/20 0925     Clinical Impression Statement Patient had trigger points in the ischiocavernosus, and puborectalis. She understands how to relieve these muscles at home with her babys dad can help her. Patient still will walk with a waddle due to the pelvic pain. She understands to contract the pelvic floro after she urinates or have a bowel movement to reduce the  pressure feeling. Patient continues to learn about aftercare to the perineum to  reduce her  discomfort. Patient will benefit from skilled therapy to reduce her pain and assis in management of pain as her pregnancy progresses.    Personal Factors and Comorbidities Comorbidity 1;Sex    Comorbidities presently pregnant and due 7/25/20222     Examination-Activity Limitations Bathing;Bed Mobility;Sit;Bend;Squat;Carry;Stairs;Stand;Dressing;Transfers;Locomotion Level    Examination-Participation Restrictions Community Activity;Interpersonal Relationship    Stability/Clinical Decision Making Evolving/Moderate complexity    Rehab Potential Good    PT Frequency 1x / week    PT Duration 6 weeks    PT Treatment/Interventions ADLs/Self Care Home Management;Cryotherapy;Moist Heat;Therapeutic activities;Therapeutic exercise;Neuromuscular re-education;Patient/family education;Manual techniques;Energy conservation;Spinal Manipulations;Joint Manipulations    PT Next Visit Plan work on pelvic muscle spasms, press on pubic bone to release th e puborectalis; gluteal squeezes, hamstring in sitting,  right hip mobilization    PT Home Exercise Plan Access Code: [email or web address]    Recommended Other Services inital note signed by provider    Consulted and Agree with Plan of Care Patient             Patient will benefit from skilled therapeutic intervention in order to improve the following deficits and impairments:  Decreased endurance, Decreased activity tolerance, Decreased strength, Increased fascial restricitons, Pain, Increased muscle spasms, Decreased range of motion, Decreased coordination  Visit Diagnosis: Muscle weakness (generalized)  Other muscle spasm  Pelvic pain     Problem List Patient Active Problem List   Diagnosis Date Noted   Gestational diabetes mellitus, class A2 09/28/2020   Carrier of spinal muscular atrophy 07/03/2020   Elevated BP without diagnosis of hypertension 07/03/2020   Headache in pregnancy, antepartum, second trimester 07/03/2020   Supervision of high risk pregnancy, antepartum 05/31/2020   History of depression 05/31/2020   Obesity in pregnancy, antepartum 05/31/2020   Adjustment disorder with mixed anxiety and depressed mood 04/02/2020   Marijuana user 06/22/2018   Adolescent behavior problem    Attention  deficit hyperactivity disorder 08/24/2013    Eulis Foster, PT 11/05/20 9:30 AM  [place] [facility] [address], [address] [place], [place], [address] Phone: [phone]   Fax:  [phone]  Name: Bethany Avery MRN: [id number] Date of Birth: [birth year]

## 2020-11-05 NOTE — MAU Note (Signed)
Presents with c/o LOF x2 weeks.  States pants and panties have been wet.  Reports fluid is "yellowish".  Denies VB.  Endorses +FM.

## 2020-11-05 NOTE — MAU Note (Signed)
Reports ? Leakage, first noted over a wk ago.  Has been ongoing off and on, clearish yellow. Has been having Braxon Hicks- Moderate. No bleeding.

## 2020-11-06 ENCOUNTER — Ambulatory Visit: Payer: Medicaid Other | Admitting: *Deleted

## 2020-11-06 ENCOUNTER — Ambulatory Visit (INDEPENDENT_AMBULATORY_CARE_PROVIDER_SITE_OTHER): Payer: Medicaid Other

## 2020-11-06 VITALS — BP 125/77 | HR 99

## 2020-11-06 DIAGNOSIS — O24415 Gestational diabetes mellitus in pregnancy, controlled by oral hypoglycemic drugs: Secondary | ICD-10-CM | POA: Diagnosis not present

## 2020-11-06 LAB — GC/CHLAMYDIA PROBE AMP (~~LOC~~) NOT AT ARMC
Chlamydia: NEGATIVE
Comment: NEGATIVE
Comment: NORMAL
Neisseria Gonorrhea: NEGATIVE

## 2020-11-06 NOTE — Progress Notes (Signed)
Patient seen and assessed by nursing staff.  Agree with documentation and plan.  NST:  Baseline: 155 bpm, Variability: Good {> 6 bpm), Accelerations: Reactive, and Decelerations: Absent  

## 2020-11-06 NOTE — Progress Notes (Signed)

## 2020-11-12 ENCOUNTER — Encounter: Payer: Self-pay | Admitting: Physical Therapy

## 2020-11-12 ENCOUNTER — Other Ambulatory Visit: Payer: Self-pay

## 2020-11-12 ENCOUNTER — Encounter: Payer: Medicaid Other | Admitting: Physical Therapy

## 2020-11-12 DIAGNOSIS — M62838 Other muscle spasm: Secondary | ICD-10-CM

## 2020-11-12 DIAGNOSIS — M6281 Muscle weakness (generalized): Secondary | ICD-10-CM

## 2020-11-12 DIAGNOSIS — R102 Pelvic and perineal pain: Secondary | ICD-10-CM

## 2020-11-12 NOTE — Therapy (Signed)
[facility] at [facility] [address] [place], [place], [address] Phone: [phone]   Fax:  [phone]  Physical Therapy Treatment  Patient Details  Name: Bethany Avery MRN: [id number] Date of Birth: [birth year] Referring Provider (PT): Donia Ast, NP   Encounter Date: 11/12/2020   PT End of Session - 11/12/20 0922     Visit Number 3    Date for PT Re-Evaluation 12/10/20    Authorization Type [employer]    Authorization Time Period 10/29/20-01/07/2021    Authorization - Visit Number 2    Authorization - Number of Visits 10    PT Start Time 0838    PT Stop Time 0922    PT Time Calculation (min) 44 min    Activity Tolerance Patient tolerated treatment well;Patient limited by pain;No increased pain    Behavior During Therapy [facility] for tasks assessed/performed             Past Medical History:  Diagnosis Date   ADHD (attention deficit hyperactivity disorder)    Anxiety    Depression    Gestational diabetes    Gonorrhea 02/02/2018   Headache     History reviewed. No pertinent surgical history.  There were no vitals filed for this visit.   Subjective Assessment - 11/12/20 0838     Subjective When I go to the bathroom I feel the pain internally and just laying there.    Patient Stated Goals reduce pain    Currently in Pain? Yes    Pain Score 3     Pain Location Abdomen    Pain Orientation Lower    Pain Descriptors / Indicators Pressure    Pain Type Acute pain    Pain Onset More than a month ago    Pain Frequency Intermittent    Aggravating Factors  turning in bed, laying in bed, walk a long time    Pain Relieving Factors not sure    Multiple Pain Sites Yes    Pain Score 4    Pain Location Vagina    Pain Orientation Right;Lower    Pain Descriptors / Indicators Pressure    Pain Type Acute pain    Pain Onset More than a month ago    Pain Frequency Intermittent    Aggravating Factors  when using the  bathroom feels like pressure;    Pain Relieving Factors not going to the bathroom                               [facility] Adult PT Treatment/Exercise - 11/12/20 0001       Self-Care   Self-Care Other Self-Care Comments    Other Self-Care Comments  laying on her side or sitting and placing hands above the pubic bone and gently pulling upward and out to reduce the pressure on the pubic bone and alleviate pain      Therapeutic Activites    Therapeutic Activities ADL's    ADL's laying in bed with the tomp leg supported and foot to reduce the top leg going across the midline of the body, place a towel roll under the abdmen      Lumbar Exercises: Sidelying   Other Sidelying Lumbar Exercises sidely with leg rest under top leg, press foot into the leg rest and lift the knee while contracting the gluteal 10x each side    Other Sidelying Lumbar Exercises sidely with top leg extended and knee  flexed puhing back and bottom leg flexed pushing down to contract the gluteasl but monitoring for pain 5sec 10x each side; iliacus pull back      Manual Therapy   Manual Therapy Soft tissue mobilization    Soft tissue mobilization bilateral hip adductor, along the pubic rami wo twork on the levator ani                    PT Education - 11/12/20 0921     Education Details ly gluteal adductor squeeze in the corner; massage suprapubically to alleviate discomfort    Person(s) Educated Patient    Methods Explanation;Demonstration;Handout    Comprehension Returned demonstration;Verbalized understanding              PT Short Term Goals - 11/12/20 0842       PT SHORT TERM GOAL #1   Title independent with initial HEP    Time 3    Period Weeks    Status Achieved               PT Long Term Goals - 10/29/20 1031       PT LONG TERM GOAL #1   Title independent iwth advanced HEP for pain management    Time 6    Period Weeks    Status New    Target Date 12/10/20       PT LONG TERM GOAL #2   Title able to perform transitional movements  with correct body mechanics and breath to reduce strain on her pelvis pelvis    Baseline not educated yet    Time 6    Period Weeks    Status New    Target Date 12/10/20      PT LONG TERM GOAL #3   Title reduction of trigger points in the hip adductors, pelvic floor muscles, and lumbar to reduce her pain to a manageable level </= 5/10    Baseline pain level 9/10    Time 6    Period Weeks    Status New    Target Date 12/10/20      PT LONG TERM GOAL #4   Title understnad ways to give birth to reduce the strain on her pelvis and back with correct breath and different positions    Baseline not educated yet    Time 6    Period Weeks    Status New    Target Date 12/10/20                   Plan - 11/12/20 0923     Clinical Impression Statement Patient reports her abdominal pain is 75% better. When she sits on the commode, she does not feel like she has to push and just feels the pressure. Patient had is working on gluteal contractions to stabilize the pelvis. She had trigger points in the pelvic floor and was reduced after manual work. Patient will benefit from skilled therapy to reduce her pain and assist in management of pain as her pregnancy progresses.    Personal Factors and Comorbidities Comorbidity 1;Sex    Comorbidities presently pregnant and due 7/25/20222    Examination-Activity Limitations Bathing;Bed Mobility;Sit;Bend;Squat;Carry;Stairs;Stand;Dressing;Transfers;Locomotion Level    Examination-Participation Restrictions Community Activity;Interpersonal Relationship    Stability/Clinical Decision Making Evolving/Moderate complexity    Rehab Potential Good    PT Frequency 1x / week    PT Duration 6 weeks    PT Treatment/Interventions ADLs/Self Care Home Management;Cryotherapy;Moist Heat;Therapeutic activities;Therapeutic exercise;Neuromuscular re-education;Patient/family education;Manual  techniques;Energy  conservation;Spinal Manipulations;Joint Manipulations    PT Next Visit Plan work on pelvic muscle spasms,quadruped push the knee against red band, birthing positions,  gluteal squeezes, hamstring in sitting,  right hip mobilization    PT Home Exercise Plan Access Code: [email or web address]    Consulted and Agree with Plan of Care Patient             Patient will benefit from skilled therapeutic intervention in order to improve the following deficits and impairments:  Decreased endurance, Decreased activity tolerance, Decreased strength, Increased fascial restricitons, Pain, Increased muscle spasms, Decreased range of motion, Decreased coordination  Visit Diagnosis: Muscle weakness (generalized)  Other muscle spasm  Pelvic pain     Problem List Patient Active Problem List   Diagnosis Date Noted   Gestational diabetes mellitus, class A2 09/28/2020   Carrier of spinal muscular atrophy 07/03/2020   Elevated BP without diagnosis of hypertension 07/03/2020   Headache in pregnancy, antepartum, second trimester 07/03/2020   Supervision of high risk pregnancy, antepartum 05/31/2020   History of depression 05/31/2020   Obesity in pregnancy, antepartum 05/31/2020   Adjustment disorder with mixed anxiety and depressed mood 04/02/2020   Marijuana user 06/22/2018   Adolescent behavior problem    Attention deficit hyperactivity disorder 08/24/2013    Eulis Foster, PT 11/12/20 9:27 AM   Outpatient Rehabilitation at [facility] [address] [place], [place], [address] Phone: [phone]   Fax:  [phone]  Name: Cadance Raus MRN: [id number] Date of Birth: 2001-04-28

## 2020-11-13 ENCOUNTER — Ambulatory Visit (INDEPENDENT_AMBULATORY_CARE_PROVIDER_SITE_OTHER): Payer: Medicaid Other | Admitting: General Practice

## 2020-11-13 ENCOUNTER — Ambulatory Visit (INDEPENDENT_AMBULATORY_CARE_PROVIDER_SITE_OTHER): Payer: Medicaid Other

## 2020-11-13 ENCOUNTER — Telehealth: Payer: Self-pay | Admitting: Obstetrics and Gynecology

## 2020-11-13 VITALS — BP 123/80 | HR 102

## 2020-11-13 DIAGNOSIS — O0990 Supervision of high risk pregnancy, unspecified, unspecified trimester: Secondary | ICD-10-CM

## 2020-11-13 DIAGNOSIS — O24419 Gestational diabetes mellitus in pregnancy, unspecified control: Secondary | ICD-10-CM

## 2020-11-13 DIAGNOSIS — Z3A35 35 weeks gestation of pregnancy: Secondary | ICD-10-CM

## 2020-11-13 NOTE — Progress Notes (Signed)
Pt informed that the ultrasound is considered a limited OB ultrasound and is not intended to be a complete ultrasound exam.  Patient also informed that the ultrasound is not being completed with the intent of assessing for fetal or placental anomalies or any pelvic abnormalities.  Explained that the purpose of today's ultrasound is to assess for  BPP, presentation, and AFI.  Patient acknowledges the purpose of the exam and the limitations of the study.     Adden Strout H RN BSN 11/13/20  

## 2020-11-13 NOTE — Telephone Encounter (Signed)
Received a call from [name] stating she has not been able to keep anything down. She has felt flushed, and her sugar was 77. Spoke to Bethany Avery CNM, and she informed me to let her know if she can not keep anything down for a solid 3 hours, she may need to go to the hospital for IV fluids. [name] stated she was going to drink something, and try to keep it down.

## 2020-11-19 ENCOUNTER — Encounter: Payer: Medicaid Other | Admitting: Physical Therapy

## 2020-11-19 ENCOUNTER — Other Ambulatory Visit: Payer: Self-pay

## 2020-11-19 ENCOUNTER — Encounter: Payer: Self-pay | Admitting: Physical Therapy

## 2020-11-19 DIAGNOSIS — M62838 Other muscle spasm: Secondary | ICD-10-CM

## 2020-11-19 DIAGNOSIS — M6281 Muscle weakness (generalized): Secondary | ICD-10-CM

## 2020-11-19 DIAGNOSIS — R102 Pelvic and perineal pain: Secondary | ICD-10-CM

## 2020-11-19 NOTE — Therapy (Addendum)
[place] [facility] [address], [place], [place], [address] Phone: [phone]   Fax:  [phone]  Physical Therapy Treatment  Patient Details  Name: Bethany Avery MRN: [id number] Date of Birth: July 15, 2000 Referring Provider (PT): Vernice Jefferson, NP   Encounter Date: 11/19/2020   PT End of Session - 11/19/20 0913     Visit Number 4    Date for PT Re-Evaluation 12/10/20    Authorization Type [employer]    Authorization Time Period 10/29/20-01/07/2021    Authorization - Visit Number 3    Authorization - Number of Visits 10    PT Start Time 0830    PT Stop Time 0913    PT Time Calculation (min) 43 min    Activity Tolerance Patient tolerated treatment well;No increased pain    Behavior During Therapy [facility] for tasks assessed/performed             Past Medical History:  Diagnosis Date   ADHD (attention deficit hyperactivity disorder)    Anxiety    Depression    Gestational diabetes    Gonorrhea 02/02/2018   Headache     History reviewed. No pertinent surgical history.  There were no vitals filed for this visit.   Subjective Assessment - 11/19/20 0835     Subjective Having less pain. I have pain when turning side to side and when I sneeze. I have been doing my exercises. Pain is 40% better. No abdominal pain. Less pain with walking.    Patient Stated Goals reduce pain    Currently in Pain? Yes    Pain Score 8     Pain Location Vagina    Pain Orientation Mid;Lower    Pain Descriptors / Indicators Pressure    Pain Type Acute pain    Pain Onset More than a month ago    Pain Frequency Intermittent    Aggravating Factors  turning in bed, sneezing, getting out of bed when in bed for a long time    Pain Relieving Factors time    Multiple Pain Sites No                [facility] PT Assessment - 11/19/20 0001       Assessment   Medical Diagnosis O26.892, R10.2 Pelvic pain affecting pregnancy in second  trimester, antepartum    Referring Provider (PT) Vernice Jefferson, NP    Onset Date/Surgical Date --   06/2020; due date 12/16/2020   Prior Therapy none      Precautions   Precautions Other (comment)    Precaution Comments pregnant and due  12/16/2020      Restrictions   Weight Bearing Restrictions No      Home Environment   Living Environment Private residence      Prior Function   Level of Independence Independent    Vocation Other (comment)   was a Scientist, 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 due to the medication she is taking for the pain      Cognition   Overall Cognitive Status Within Functional Limits for tasks assessed      Observation/Other Assessments   Focus on Therapeutic Outcomes (FOTO)  [id number]      AROM   Overall AROM Comments full lumbar ROM without pain      PROM   Right Hip Flexion 120    Right Hip External Rotation  70      Special Tests  Special Tests Hip Special Tests    Hip Special Tests  Trendelenberg Test      Trendelenburg Test   Findings Positive    Side Right    Comments left hip drop                           [facility] Adult PT Treatment/Exercise - 11/19/20 0001       Lumbar Exercises: Sidelying   Clam Right;Left;10 reps;1 second    Clam Limitations yellow band      Lumbar Exercises: Quadruped   Other Quadruped Lumbar Exercises quadruped push knee outward againse yellow band keepding trunk still 10x each side      Knee/Hip Exercises: Seated   Hamstring Curl Strengthening;Right;Left;1 set;15 reps    Hamstring Limitations yellow band wiht VC to not turn foot inward      Manual Therapy   Manual Therapy Soft tissue mobilization    Soft tissue mobilization bilateral hip adductors to release the trigger points                    PT Education - 11/19/20 0913     Education Details Access Code: [email or web address]    Person(s) Educated Patient    Methods Explanation;Demonstration;Verbal  cues;Handout    Comprehension Returned demonstration;Verbalized understanding              PT Short Term Goals - 11/12/20 0842       PT SHORT TERM GOAL #1   Title independent with initial HEP    Time 3    Period Weeks    Status Achieved               PT Long Term Goals - 11/19/20 0917       PT LONG TERM GOAL #1   Title independent iwth advanced HEP for pain management    Time 6    Period Weeks    Status On-going      PT LONG TERM GOAL #2   Title able to perform transitional movements  with correct body mechanics and breath to reduce strain on her pelvis pelvis    Time 6    Period Weeks    Status Achieved      PT LONG TERM GOAL #3   Title reduction of trigger points in the hip adductors, pelvic floor muscles, and lumbar to reduce her pain to a manageable level </= 5/10    Time 6    Period Weeks    Status On-going      PT LONG TERM GOAL #4   Title understnad ways to give birth to reduce the strain on her pelvis and back with correct breath and different positions    Time 6    Period Weeks    Status New                   Plan - 11/19/20 0913     Clinical Impression Statement Patient reports her pain is decreased by 40%. She is not having abdominal pain just symphysis pubic pain when she turns in bed and sneezes. Patient now has full lumbar ROM without pain. she has full right hip P/ROM. She has less pain with walking. After therapy she had no pain. Patient has weakness in the gluteus medius causing some instability at the SI joint. She has trigger points in the hip adductors causing some Pubic Symphysis pain. Patient will benefit from skilled therapy to reduce  her pain and assist in management of pain as her pregnancy progresses.    Personal Factors and Comorbidities Comorbidity 1;Sex    Comorbidities presently pregnant and due 7/25/20222    Examination-Activity Limitations Bathing;Bed  Mobility;Sit;Bend;Squat;Carry;Stairs;Stand;Dressing;Transfers;Locomotion Level    Examination-Participation Restrictions Community Activity;Interpersonal Relationship    Stability/Clinical Decision Making Evolving/Moderate complexity    Rehab Potential Good    PT Frequency 1x / week    PT Duration 6 weeks    PT Treatment/Interventions ADLs/Self Care Home Management;Cryotherapy;Moist Heat;Therapeutic activities;Therapeutic exercise;Neuromuscular re-education;Patient/family education;Manual techniques;Energy conservation;Spinal Manipulations;Joint Manipulations    PT Next Visit Plan birhting poisitons, manual work to hip adductors, after birth care    PT Home Exercise Plan Access Code: [email or web address]    Consulted and Agree with Plan of Care Patient             Patient will benefit from skilled therapeutic intervention in order to improve the following deficits and impairments:  Decreased endurance, Decreased activity tolerance, Decreased strength, Increased fascial restricitons, Pain, Increased muscle spasms, Decreased range of motion, Decreased coordination  Visit Diagnosis: Muscle weakness (generalized)  Other muscle spasm  Pelvic pain     Problem List Patient Active Problem List   Diagnosis Date Noted   Gestational diabetes mellitus, class A2 09/28/2020   Carrier of spinal muscular atrophy 07/03/2020   Elevated BP without diagnosis of hypertension 07/03/2020   Headache in pregnancy, antepartum, second trimester 07/03/2020   Supervision of high risk pregnancy, antepartum 05/31/2020   History of depression 05/31/2020   Obesity in pregnancy, antepartum 05/31/2020   Adjustment disorder with mixed anxiety and depressed mood 04/02/2020   Marijuana user 06/22/2018   Adolescent behavior problem    Attention deficit hyperactivity disorder 08/24/2013    Earlie Counts, PT 11/19/20 9:18 AM  [facility] [place] [facility] [address] [place], [place], [address] Phone: [phone]   Fax:  [phone]  Name: Bethany Avery MRN: [id number] Date of Birth: [birth year] PHYSICAL THERAPY DISCHARGE SUMMARY  Visits from Start of Care: 4  Current functional level related to goals / functional outcomes: See above. Patient had her baby so she will be discharged.    Remaining deficits: See above.    Education / Equipment: HEP   Patient agrees to discharge. Patient goals were partially met. Patient is being discharged due to a change in medical status.Thank you for the referral. Earlie Counts, PT 12/04/20 9:20 AM

## 2020-11-19 NOTE — Patient Instructions (Signed)
Access Code: [email or web address] URL: [email or web address] Date: 11/19/2020 Prepared by: Eulis Foster  Exercises Sidelying Pelvic Floor Contraction with Self-Palpation - 2 x daily - 7 x weekly - 1 sets - 10 reps - 5 sec hold Sidelying Open Book Thoracic Rotation with Knee on Foam Roll - 1 x daily - 7 x weekly - 1 sets - 10 reps Clamshell with Resistance - 1 x daily - 7 x weekly - 1 sets - 10 reps Quadruped Hip Abduction with Resistance Loop - 1 x daily - 7 x weekly - 1 sets - 10 reps Seated Hamstring Curl with Anchored Resistance - 1 x daily - 7 x weekly - 2 sets - 10 reps Eulis Foster, PT [facility] [address] [place], [place] [address] W: [phone] [email or web address]

## 2020-11-20 ENCOUNTER — Ambulatory Visit (INDEPENDENT_AMBULATORY_CARE_PROVIDER_SITE_OTHER): Payer: Medicaid Other | Admitting: General Practice

## 2020-11-20 ENCOUNTER — Ambulatory Visit (INDEPENDENT_AMBULATORY_CARE_PROVIDER_SITE_OTHER): Payer: Medicaid Other

## 2020-11-20 VITALS — BP 125/81 | HR 106

## 2020-11-20 DIAGNOSIS — O24419 Gestational diabetes mellitus in pregnancy, unspecified control: Secondary | ICD-10-CM | POA: Diagnosis not present

## 2020-11-20 DIAGNOSIS — O0990 Supervision of high risk pregnancy, unspecified, unspecified trimester: Secondary | ICD-10-CM

## 2020-11-20 NOTE — Progress Notes (Signed)
Pt informed that the ultrasound is considered a limited OB ultrasound and is not intended to be a complete ultrasound exam.  Patient also informed that the ultrasound is not being completed with the intent of assessing for fetal or placental anomalies or any pelvic abnormalities.  Explained that the purpose of today's ultrasound is to assess for  BPP, presentation, and AFI.  Patient acknowledges the purpose of the exam and the limitations of the study.     Levie Wages H RN BSN 11/20/20  

## 2020-11-22 ENCOUNTER — Other Ambulatory Visit: Payer: Self-pay

## 2020-11-22 ENCOUNTER — Ambulatory Visit (INDEPENDENT_AMBULATORY_CARE_PROVIDER_SITE_OTHER): Payer: Medicaid Other

## 2020-11-22 ENCOUNTER — Other Ambulatory Visit (HOSPITAL_COMMUNITY)
Admission: RE | Admit: 2020-11-22 | Discharge: 2020-11-22 | Disposition: A | Discharge status: Home / Self Care | Payer: Medicaid Other | Source: Ambulatory Visit | Attending: Obstetrics and Gynecology | Admitting: Obstetrics and Gynecology

## 2020-11-22 ENCOUNTER — Encounter (HOSPITAL_COMMUNITY): Payer: Self-pay | Admitting: *Deleted

## 2020-11-22 ENCOUNTER — Telehealth (HOSPITAL_COMMUNITY): Payer: Self-pay | Admitting: *Deleted

## 2020-11-22 VITALS — BP 131/84 | HR 91 | Temp 97.6°F | Wt 228.8 lb

## 2020-11-22 DIAGNOSIS — O98819 Other maternal infectious and parasitic diseases complicating pregnancy, unspecified trimester: Secondary | ICD-10-CM

## 2020-11-22 DIAGNOSIS — O24419 Gestational diabetes mellitus in pregnancy, unspecified control: Secondary | ICD-10-CM | POA: Diagnosis present

## 2020-11-22 DIAGNOSIS — B373 Candidiasis of vulva and vagina: Secondary | ICD-10-CM

## 2020-11-22 DIAGNOSIS — O0990 Supervision of high risk pregnancy, unspecified, unspecified trimester: Secondary | ICD-10-CM | POA: Diagnosis not present

## 2020-11-22 DIAGNOSIS — Z3A36 36 weeks gestation of pregnancy: Secondary | ICD-10-CM | POA: Insufficient documentation

## 2020-11-22 DIAGNOSIS — B3731 Acute candidiasis of vulva and vagina: Secondary | ICD-10-CM

## 2020-11-22 MED ORDER — GLYBURIDE 2.5 MG PO TABS: 2.5000 mg | ORAL_TABLET | Freq: Every day | ORAL | 2 refills | Status: DC

## 2020-11-22 NOTE — Progress Notes (Signed)
HIGH-RISK PREGNANCY OFFICE VISIT  Patient name: Bethany Avery MRN [id number]  Date of birth: 01/25/2001 Chief Complaint:   Routine Prenatal Visit  Subjective:   Bethany Avery is a 20 y.o. G27P0010 female at [redacted]w[redacted]d with an Estimated Date of Delivery: 12/18/20 being seen today for ongoing management of a high-risk pregnancy aeb has Adolescent behavior problem; Adjustment disorder with mixed anxiety and depressed mood; Supervision of high risk pregnancy, antepartum; History of depression; Obesity in pregnancy, antepartum; Marijuana user; Attention deficit hyperactivity disorder; Carrier of spinal muscular atrophy; Elevated BP without diagnosis of hypertension; Headache in pregnancy, antepartum, second trimester; and Gestational diabetes mellitus, class A2 on their problem list.  Patient presents today with no complaints, but expresses desire to be done with pregnancy.   Patient endorses fetal movement. Patient denies abdominal cramping or contractions.  Patient denies vaginal concerns including abnormal discharge, leaking of fluid, and bleeding.  Patient reports compliance with CBGs, but recognizes that they remain out of range. questions if she can take a trip to the beach this weekend. States she would "just like to dip my feet in the water one more time before I delivery." Contractions: Irregular. Vag. Bleeding: None.  Movement: Present.  Reviewed past medical,surgical, social, obstetrical and family history as well as problem list, medications and allergies.  Objective   Vitals:   11/22/20 0856  BP: 131/84  Pulse: 91  Temp: 97.6 F (36.4 C)  Weight: 228 lb 12.8 oz (103.8 kg)  Body mass index is 38.07 kg/m.  Total Weight Gain:18 lb 12.8 oz (8.528 kg)         Physical Examination:   General appearance: Well appearing, and in no distress  Mental status: Alert, oriented to person, place, and time  Skin: Warm & dry  Cardiovascular: Normal heart rate noted  Respiratory:  Normal respiratory effort, no distress  Abdomen: Soft, gravid, nontender, AGA with Fundal Height: 36 cm  Pelvic: Cervical exam performed  Dilation: Closed Effacement (%): Thick Station: Ballotable Presentation: Undeterminable Exam glove with notable yeast aeb curdy green discharge.   Extremities: Edema: None  Fetal Status: Fetal Heart Rate (bpm): 154  Movement: Present   No results found for this or any previous visit (from the past 24 hour(s)).  Assessment & Plan:  High-risk pregnancy of a 20 y.o., G2P0010 at [redacted]w[redacted]d with an Estimated Date of Delivery: 12/18/20   1. Gestational diabetes mellitus, class A2 -Fastings: 93-191 -Log reviewed and blood sugars varies significantly. -Discussed how postpandrial BS are usually result of eating habits.  However, elevated fasting is sign of need for insulin mgmt as oral is uneffective. -Dr. Gildardo Griffes consulted and informed of patient status, evaluation, interventions, and results. Advised: *Plan for 37 week induction. *Start on Glyburide 2.5 for daily usage.  -Patient informed of new plan of care. -Instructed to continue to monitor blood sugars.  2. Supervision of high risk pregnancy, antepartum -Anticipatory guidance for upcoming appts. -Patient to schedule next appt in 1 weeks for an-person visit. -Labs as below. -Educated on GBS bacteria including what it is, why we test, and how and when we treat if needed. -Informed okay to take weekend trip to beach. Encouraged frequent stops to allow for ambulation. -Labor Precautions Given.   3. [redacted] weeks gestation of pregnancy -Doing well -Discussed IOL at 37 weeks. -Request for July 6th unavailable -Scheduled for [facility]hy 9th at [facility]. -Mychart message sent to patient to keep appts as scheduled for next week. -Admission orders placed with CBGs -GBS pending.  4. Candidiasis of vagina during pregnancy -Rx for Terazol 7 sent to pharmacy on file.     Meds: No orders of the defined types were placed in this  encounter.  Labs/procedures today:  Lab Orders  Culture, beta strep (group b only)    Reviewed: Term labor symptoms and general obstetric precautions including but not limited to vaginal bleeding, contractions, leaking of fluid and fetal movement were reviewed in detail with the patient.  All questions were answered.  Follow-up: Return in about 1 week (around 11/29/2020).  Orders Placed This Encounter  Procedures   Culture, beta strep (group b only)   Bethany Robins MSN, CNM 11/22/2020

## 2020-11-22 NOTE — Progress Notes (Signed)
Patient seen and assessed by nursing staff.  Agree with documentation and plan.  NST:  Baseline: 150 bpm, Variability: Good {> 6 bpm), Accelerations: Reactive, and Decelerations: Absent   

## 2020-11-22 NOTE — Telephone Encounter (Signed)
Preadmission screen  

## 2020-11-23 MED ORDER — TERCONAZOLE 0.4 % VA CREA: 1.0000 | TOPICAL_CREAM | Freq: Every day | VAGINAL | 0 refills | Status: DC

## 2020-11-25 DIAGNOSIS — B951 Streptococcus, group B, as the cause of diseases classified elsewhere: Secondary | ICD-10-CM | POA: Insufficient documentation

## 2020-11-25 HISTORY — DX: Streptococcus, group b, as the cause of diseases classified elsewhere: B95.1

## 2020-11-25 LAB — CULTURE, BETA STREP (GROUP B ONLY): Strep Gp B Culture: POSITIVE — AB

## 2020-11-26 LAB — CERVICOVAGINAL ANCILLARY ONLY
Chlamydia: NEGATIVE
Comment: NEGATIVE
Comment: NEGATIVE
Comment: NORMAL
Neisseria Gonorrhea: POSITIVE — AB
Trichomonas: NEGATIVE

## 2020-11-27 ENCOUNTER — Other Ambulatory Visit: Payer: Self-pay

## 2020-11-27 ENCOUNTER — Other Ambulatory Visit: Payer: Self-pay | Admitting: Advanced Practice Midwife

## 2020-11-27 ENCOUNTER — Ambulatory Visit (INDEPENDENT_AMBULATORY_CARE_PROVIDER_SITE_OTHER): Payer: Medicaid Other | Admitting: *Deleted

## 2020-11-27 VITALS — BP 131/89 | HR 64 | Temp 98.2°F | Ht 65.0 in | Wt 229.4 lb

## 2020-11-27 DIAGNOSIS — O98219 Gonorrhea complicating pregnancy, unspecified trimester: Secondary | ICD-10-CM | POA: Diagnosis not present

## 2020-11-27 MED ORDER — CEFTRIAXONE SODIUM 500 MG IJ SOLR
500.0000 mg | Freq: Once | INTRAMUSCULAR | Status: AC
  Administered 2020-11-27: 500 mg via INTRAMUSCULAR

## 2020-11-27 NOTE — Progress Notes (Signed)
     S: Patient in nurse clinic today for STD treatment of gonorrhea.   O: Need for treatment of gonorrhea.  A: Ceftriaxone 500 mg IM x 1 given in [facility] per Rolitta Dawson's CNM orders.    Patient observed 15 minutes in office.  No reaction noted.   P: Patient to follow up in 1 month for re-screening.    STD report form fax completed and faxed to [facility] Department at [phone] (STD department).     Patient advised to abstain from sex for 7-10 days after treatment or when partner has been tested/treated.    Clovis Pu, RN

## 2020-11-28 ENCOUNTER — Ambulatory Visit (HOSPITAL_BASED_OUTPATIENT_CLINIC_OR_DEPARTMENT_OTHER): Payer: Medicaid Other

## 2020-11-28 ENCOUNTER — Encounter: Payer: Self-pay | Admitting: *Deleted

## 2020-11-28 ENCOUNTER — Ambulatory Visit: Payer: Medicaid Other | Admitting: *Deleted

## 2020-11-28 ENCOUNTER — Encounter: Payer: Medicaid Other | Admitting: Obstetrics and Gynecology

## 2020-11-28 ENCOUNTER — Other Ambulatory Visit
Admission: RE | Admit: 2020-11-28 | Discharge: 2020-11-28 | Disposition: A | Discharge status: Home / Self Care | Payer: Medicaid Other | Source: Ambulatory Visit | Attending: Obstetrics & Gynecology | Admitting: Obstetrics & Gynecology

## 2020-11-28 VITALS — BP 116/76 | HR 98

## 2020-11-28 DIAGNOSIS — E669 Obesity, unspecified: Secondary | ICD-10-CM | POA: Diagnosis not present

## 2020-11-28 DIAGNOSIS — Z20822 Contact with and (suspected) exposure to covid-19: Secondary | ICD-10-CM | POA: Diagnosis not present

## 2020-11-28 DIAGNOSIS — O9A213 Injury, poisoning and certain other consequences of external causes complicating pregnancy, third trimester: Secondary | ICD-10-CM | POA: Insufficient documentation

## 2020-11-28 DIAGNOSIS — R109 Unspecified abdominal pain: Secondary | ICD-10-CM | POA: Insufficient documentation

## 2020-11-28 DIAGNOSIS — Z148 Genetic carrier of other disease: Secondary | ICD-10-CM | POA: Diagnosis not present

## 2020-11-28 DIAGNOSIS — O26893 Other specified pregnancy related conditions, third trimester: Secondary | ICD-10-CM | POA: Insufficient documentation

## 2020-11-28 DIAGNOSIS — Z3A37 37 weeks gestation of pregnancy: Secondary | ICD-10-CM

## 2020-11-28 DIAGNOSIS — O99212 Obesity complicating pregnancy, second trimester: Secondary | ICD-10-CM | POA: Diagnosis not present

## 2020-11-28 DIAGNOSIS — O99213 Obesity complicating pregnancy, third trimester: Secondary | ICD-10-CM

## 2020-11-28 DIAGNOSIS — B951 Streptococcus, group B, as the cause of diseases classified elsewhere: Secondary | ICD-10-CM

## 2020-11-28 DIAGNOSIS — O24415 Gestational diabetes mellitus in pregnancy, controlled by oral hypoglycemic drugs: Secondary | ICD-10-CM | POA: Diagnosis present

## 2020-11-28 DIAGNOSIS — O0990 Supervision of high risk pregnancy, unspecified, unspecified trimester: Secondary | ICD-10-CM

## 2020-11-28 DIAGNOSIS — O24419 Gestational diabetes mellitus in pregnancy, unspecified control: Secondary | ICD-10-CM

## 2020-11-29 LAB — SARS CORONAVIRUS 2 (TAT 6-24 HRS): SARS Coronavirus 2: NEGATIVE

## 2020-11-30 ENCOUNTER — Encounter (HOSPITAL_COMMUNITY): Payer: Self-pay | Admitting: Family Medicine

## 2020-11-30 ENCOUNTER — Inpatient Hospital Stay (HOSPITAL_COMMUNITY)
Admission: AD | Admit: 2020-11-30 | Discharge: 2020-12-05 | DRG: 787 | Disposition: A | Discharge status: Home / Self Care | Payer: Medicaid Other | Attending: Obstetrics & Gynecology | Admitting: Obstetrics & Gynecology

## 2020-11-30 ENCOUNTER — Inpatient Hospital Stay (HOSPITAL_COMMUNITY): Payer: Medicaid Other

## 2020-11-30 ENCOUNTER — Other Ambulatory Visit: Payer: Self-pay

## 2020-11-30 DIAGNOSIS — Z3A37 37 weeks gestation of pregnancy: Secondary | ICD-10-CM

## 2020-11-30 DIAGNOSIS — Z148 Genetic carrier of other disease: Secondary | ICD-10-CM | POA: Diagnosis not present

## 2020-11-30 DIAGNOSIS — K5900 Constipation, unspecified: Secondary | ICD-10-CM | POA: Diagnosis present

## 2020-11-30 DIAGNOSIS — O99824 Streptococcus B carrier state complicating childbirth: Secondary | ICD-10-CM | POA: Diagnosis present

## 2020-11-30 DIAGNOSIS — O99893 Other specified diseases and conditions complicating puerperium: Secondary | ICD-10-CM | POA: Diagnosis present

## 2020-11-30 DIAGNOSIS — O134 Gestational [pregnancy-induced] hypertension without significant proteinuria, complicating childbirth: Secondary | ICD-10-CM | POA: Diagnosis present

## 2020-11-30 DIAGNOSIS — O9902 Anemia complicating childbirth: Secondary | ICD-10-CM | POA: Diagnosis present

## 2020-11-30 DIAGNOSIS — O0990 Supervision of high risk pregnancy, unspecified, unspecified trimester: Secondary | ICD-10-CM

## 2020-11-30 DIAGNOSIS — Z23 Encounter for immunization: Secondary | ICD-10-CM | POA: Diagnosis not present

## 2020-11-30 DIAGNOSIS — O24425 Gestational diabetes mellitus in childbirth, controlled by oral hypoglycemic drugs: Principal | ICD-10-CM | POA: Diagnosis present

## 2020-11-30 DIAGNOSIS — F4323 Adjustment disorder with mixed anxiety and depressed mood: Secondary | ICD-10-CM | POA: Diagnosis present

## 2020-11-30 DIAGNOSIS — O4292 Full-term premature rupture of membranes, unspecified as to length of time between rupture and onset of labor: Secondary | ICD-10-CM | POA: Diagnosis present

## 2020-11-30 DIAGNOSIS — Z3490 Encounter for supervision of normal pregnancy, unspecified, unspecified trimester: Secondary | ICD-10-CM

## 2020-11-30 DIAGNOSIS — F1290 Cannabis use, unspecified, uncomplicated: Secondary | ICD-10-CM | POA: Diagnosis present

## 2020-11-30 DIAGNOSIS — O24419 Gestational diabetes mellitus in pregnancy, unspecified control: Secondary | ICD-10-CM

## 2020-11-30 DIAGNOSIS — A549 Gonococcal infection, unspecified: Secondary | ICD-10-CM | POA: Diagnosis present

## 2020-11-30 DIAGNOSIS — O139 Gestational [pregnancy-induced] hypertension without significant proteinuria, unspecified trimester: Secondary | ICD-10-CM | POA: Diagnosis present

## 2020-11-30 DIAGNOSIS — B951 Streptococcus, group B, as the cause of diseases classified elsewhere: Secondary | ICD-10-CM | POA: Diagnosis present

## 2020-11-30 DIAGNOSIS — O99324 Drug use complicating childbirth: Secondary | ICD-10-CM | POA: Diagnosis present

## 2020-11-30 DIAGNOSIS — O4290 Premature rupture of membranes, unspecified as to length of time between rupture and onset of labor, unspecified weeks of gestation: Secondary | ICD-10-CM

## 2020-11-30 HISTORY — DX: Encounter for supervision of normal pregnancy, unspecified, unspecified trimester: Z34.90

## 2020-11-30 LAB — GLUCOSE, CAPILLARY
Glucose-Capillary: 152 mg/dL — ABNORMAL HIGH (ref 70–99)
Glucose-Capillary: 87 mg/dL (ref 70–99)
Glucose-Capillary: 97 mg/dL (ref 70–99)
Glucose-Capillary: 127 mg/dL — ABNORMAL HIGH (ref 70–99)
Glucose-Capillary: 108 mg/dL — ABNORMAL HIGH (ref 70–99)
Glucose-Capillary: 100 mg/dL — ABNORMAL HIGH (ref 70–99)

## 2020-11-30 LAB — CBC
HCT: 32.9 % — ABNORMAL LOW (ref 36.0–46.0)
Hemoglobin: 11.4 g/dL — ABNORMAL LOW (ref 12.0–15.0)
MCH: 31.8 pg (ref 26.0–34.0)
MCHC: 34.7 g/dL (ref 30.0–36.0)
MCV: 91.6 fL (ref 80.0–100.0)
Platelets: 323 10*3/uL (ref 150–400)
RBC: 3.59 MIL/uL — ABNORMAL LOW (ref 3.87–5.11)
RDW: 12.4 % (ref 11.5–15.5)
WBC: 8.2 10*3/uL (ref 4.0–10.5)
nRBC: 0 % (ref 0.0–0.2)

## 2020-11-30 LAB — TYPE AND SCREEN
ABO/RH(D): A POS
Antibody Screen: NEGATIVE

## 2020-11-30 LAB — RPR: RPR Ser Ql: NONREACTIVE

## 2020-11-30 MED ORDER — MISOPROSTOL 25 MCG QUARTER TABLET
ORAL_TABLET | ORAL | Status: AC
  Administered 2020-12-01: 50 ug via BUCCAL
  Filled 2020-11-30: qty 1

## 2020-11-30 MED ORDER — MISOPROSTOL 50MCG HALF TABLET
50.0000 ug | ORAL_TABLET | ORAL | Status: DC | PRN
  Administered 2020-11-30 (×3): 50 ug via ORAL
  Filled 2020-11-30 (×3): qty 1

## 2020-11-30 MED ORDER — FENTANYL CITRATE (PF) 100 MCG/2ML IJ SOLN
50.0000 ug | INTRAMUSCULAR | Status: DC | PRN
  Administered 2020-12-01 (×2): 100 ug via INTRAVENOUS
  Filled 2020-11-30 (×2): qty 2

## 2020-11-30 MED ORDER — MISOPROSTOL 50MCG HALF TABLET: 50.0000 ug | ORAL_TABLET | ORAL | Status: DC

## 2020-11-30 MED ORDER — MISOPROSTOL 25 MCG QUARTER TABLET
25.0000 ug | ORAL_TABLET | ORAL | Status: DC | PRN
  Administered 2020-11-30: 25 ug via VAGINAL

## 2020-11-30 MED ORDER — PENICILLIN G POT IN DEXTROSE 60000 UNIT/ML IV SOLN: 3.0000 10*6.[IU] | INTRAVENOUS | Status: DC

## 2020-11-30 MED ORDER — OXYTOCIN BOLUS FROM INFUSION: 333.0000 mL | Freq: Once | INTRAVENOUS | Status: DC

## 2020-11-30 MED ORDER — LIDOCAINE HCL (PF) 1 % IJ SOLN: 30.0000 mL | INTRAMUSCULAR | Status: DC | PRN

## 2020-11-30 MED ORDER — SOD CITRATE-CITRIC ACID 500-334 MG/5ML PO SOLN
30.0000 mL | ORAL | Status: DC | PRN
  Administered 2020-12-02: 30 mL via ORAL
  Filled 2020-11-30: qty 30

## 2020-11-30 MED ORDER — LACTATED RINGERS IV SOLN
500.0000 mL | INTRAVENOUS | Status: DC | PRN
  Administered 2020-12-02: 500 mL via INTRAVENOUS

## 2020-11-30 MED ORDER — OXYTOCIN-SODIUM CHLORIDE 30-0.9 UT/500ML-% IV SOLN
2.5000 [IU]/h | INTRAVENOUS | Status: DC
  Filled 2020-11-30: qty 500

## 2020-11-30 MED ORDER — LACTATED RINGERS IV SOLN
INTRAVENOUS | Status: DC
  Administered 2020-11-30: 125 mL via INTRAVENOUS

## 2020-11-30 MED ORDER — TERBUTALINE SULFATE 1 MG/ML IJ SOLN: 0.2500 mg | Freq: Once | INTRAMUSCULAR | Status: DC | PRN

## 2020-11-30 MED ORDER — ACETAMINOPHEN 325 MG PO TABS
650.0000 mg | ORAL_TABLET | ORAL | Status: DC | PRN
  Administered 2020-11-30 – 2020-12-02 (×3): 650 mg via ORAL
  Filled 2020-11-30 (×3): qty 2

## 2020-11-30 MED ORDER — SODIUM CHLORIDE 0.9 % IV SOLN
5.0000 10*6.[IU] | Freq: Once | INTRAVENOUS | Status: DC
  Filled 2020-11-30: qty 5

## 2020-11-30 MED ORDER — ONDANSETRON HCL 4 MG/2ML IJ SOLN
4.0000 mg | Freq: Four times a day (QID) | INTRAMUSCULAR | Status: DC | PRN
  Administered 2020-12-01 (×2): 4 mg via INTRAVENOUS
  Filled 2020-11-30 (×2): qty 2

## 2020-11-30 MED ORDER — MISOPROSTOL 50MCG HALF TABLET
50.0000 ug | ORAL_TABLET | ORAL | Status: DC | PRN
  Administered 2020-11-30: 50 ug via BUCCAL
  Filled 2020-11-30 (×2): qty 1

## 2020-11-30 NOTE — H&P (Signed)
Bethany Avery is a 20 y.o. female presenting for Induction of Labor for GDMA2.  Bethany Avery has been followed at [place].   Her GDM has not been well controlled.  She has been on Metformin and continued to have blood sugars out of range.  She has had some borderline elevation in blood pressure.   She was also recently treated for Gonorrhea on 11/27/20  Patient Active Problem List   Diagnosis Date Noted   Indication for care or intervention in labor or delivery 11/30/2020   Pregnant 11/30/2020   Positive GBS test 11/25/2020   Gestational diabetes mellitus, class A2 09/28/2020   Carrier of spinal muscular atrophy 07/03/2020   Elevated BP without diagnosis of hypertension 07/03/2020   Headache in pregnancy, antepartum, second trimester 07/03/2020   Supervision of high risk pregnancy, antepartum 05/31/2020   History of depression 05/31/2020   Obesity in pregnancy, antepartum 05/31/2020   Adjustment disorder with mixed anxiety and depressed mood 04/02/2020   Marijuana user 06/22/2018   Gonorrhea 02/02/2018   Adolescent behavior problem    Attention deficit hyperactivity disorder 08/24/2013   . OB History     Gravida  2   Para      Term      Preterm      AB  1   Living  0      SAB  1   IAB      Ectopic      Multiple      Live Births             Past Medical History:  Diagnosis Date   ADHD (attention deficit hyperactivity disorder)    Anxiety    Depression    Gestational diabetes    Gonorrhea 02/02/2018   Headache    History reviewed. No pertinent surgical history. Family History: family history includes Healthy in her father and mother. Social History:  reports that she has never smoked. She has never used smokeless tobacco. She reports previous alcohol use. She reports previous drug use. Drug: Marijuana.     Maternal Diabetes: Yes:  Diabetes Type:  Insulin/Medication controlled Genetic Screening: Normal Maternal Ultrasounds/Referrals: Normal Fetal  Ultrasounds or other Referrals:  None Maternal Substance Abuse:  No Significant Maternal Medications:  Meds include: Other: Metformin, Glyburide Significant Maternal Lab Results:  Group B Strep positive Other Comments:  None  Review of Systems  Constitutional:  Negative for chills and fever.  Eyes:  Negative for visual disturbance.  Respiratory:  Negative for shortness of breath.   Gastrointestinal:  Negative for abdominal pain, constipation, diarrhea, nausea and vomiting.  Genitourinary:  Negative for vaginal bleeding.  Musculoskeletal:  Negative for back pain.  Neurological:  Negative for weakness.  Maternal Medical History:  Reason for admission: Nausea. IOL for GDM  Contractions: Frequency: irregular.   Perceived severity is mild.   Fetal activity: Perceived fetal activity is normal.   Last perceived fetal movement was within the past hour.   Prenatal complications: No PIH (borderline 130/80s), pre-eclampsia or preterm labor.   Prenatal Complications - Diabetes: gestational. Diabetes is managed by oral agent (dual therapy).    Dilation: Closed Effacement (%): 60 Station: Ballotable Exam by:: Bethany Avery, CNM Blood pressure 137/78, pulse 80, temperature 98.2 F (36.8 C), temperature source Oral, resp. rate 18, height 5\' 5"  (1.651 m), weight 104.8 kg, last menstrual period 03/07/2020. Maternal Exam:  Uterine Assessment: Contraction strength is mild.  Contraction frequency is irregular.  Abdomen: Patient reports no abdominal tenderness. Estimated fetal weight  is 7.5.   Fetal presentation: vertex Introitus: Normal vulva. Normal vagina.  Ferning test: not done.  Nitrazine test: not done. Amniotic fluid character: not assessed. Pelvis: adequate for delivery.   Cervix: Cervix evaluated by digital exam.     Fetal Exam Fetal Monitor Review: Mode: ultrasound.   Baseline rate: 140.  Variability: moderate (6-25 bpm).   Pattern: accelerations present.   Fetal State Assessment:  Category I - tracings are normal.  Physical Exam Constitutional:      General: She is not in acute distress.    Appearance: Normal appearance. She is not ill-appearing.  HENT:     Head: Normocephalic.  Cardiovascular:     Rate and Rhythm: Normal rate.  Pulmonary:     Effort: Pulmonary effort is normal.  Abdominal:     General: There is no distension.     Tenderness: There is no abdominal tenderness. There is no guarding or rebound.  Genitourinary:    General: Normal vulva.     Comments: Dilation: Closed Effacement (%): 60 Station: Ballotable Presentation: Vertex Exam by:: Bethany Avery, CNM  Musculoskeletal:        General: Normal range of motion.     Cervical back: Normal range of motion.  Skin:    General: Skin is warm and dry.  Neurological:     General: No focal deficit present.     Mental Status: She is alert.  Psychiatric:        Mood and Affect: Mood normal.    Prenatal labs: ABO, Rh: --/--/A POS (07/09 0043) Antibody: NEG (07/09 0043) Rubella: 4.22 (01/07 1032) RPR: Non Reactive (05/05 0843)  HBsAg: Negative (01/07 1032)  HIV: Non Reactive (05/05 0843)  GBS: Positive/-- (07/01 0947)   Assessment/Plan: Single IUP at [redacted]w[redacted]d Gestational Diabetes, poorly controlled, Oral meds Borderline elevated blood pressures SMA carrier   Admit to Labor and Delivery Routine orders Cytotec ripening of cervix Foley when able  Bethany Avery 11/30/2020, 4:10 AM

## 2020-11-30 NOTE — Progress Notes (Addendum)
Bethany Avery is a 20 y.o. G2P0010 at [redacted]w[redacted]d by admitted for IOL for uncontrolled GDMA2  Subjective: Bethany Avery is doing well. She reports no pain. Her family is present at the bedside. She has no concerns or questions at this time.   Objective: BP 118/71 (BP Location: Left Arm)   Pulse 85   Temp 98.2 F (36.8 C) (Oral)   Resp 18   Ht 5\' 5"  (1.651 m)   Wt 104.8 kg   LMP 03/07/2020   BMI 38.44 kg/m   FHT: 145 bpm, moderate variability, +accels, no decels Toco: occasional SVE:   Dilation: Fingertip Effacement (%): 60 Station: -3 Exam by:: [email or web address], CNM  Labs: Lab Results  Component Value Date   WBC 8.2 11/30/2020   HGB 11.4 (L) 11/30/2020   HCT 32.9 (L) 11/30/2020   MCV 91.6 11/30/2020   PLT 323 11/30/2020    Assessment / Plan: Bethany Avery 20 y.o. G2P0010 at [redacted]w[redacted]d admitted for IOL for uncontrolled GDMA2  Labor: Cervix unchanged. Third dose of cytotec given. Plan FB attempt at later time A2GDM: Was on Metformin at home. Q4H CBG's, last was 97. Continue to monitor  Fetal Wellbeing: Category 1  Pain Control:  epidural prn  I/D:  GBS positive, PCN ordered for when in active labor Anticipated MOD: Anticipate SVD   [redacted]w[redacted]d, CNM 11/30/2020, 10:51 AM

## 2020-11-30 NOTE — Progress Notes (Signed)
Labor Progress Note Bethany Avery is a 20 y.o. G2P0010 at [redacted]w[redacted]d presented for Induction of Labor for GDMA2. S: Patient is resting comfortably.  O:  BP 111/61   Pulse 77   Temp 98.3 F (36.8 C) (Oral)   Resp 18   Ht 5\' 5"  (1.651 m)   Wt 104.8 kg   LMP 03/07/2020   BMI 38.44 kg/m  EFM: baseline 140 BPM/mod variability/+accels/-decels  CVE: Dilation: Fingertip Effacement (%): 60 Station: -3 Presentation: Vertex Exam by:: [email or web address], B, RN   A&P: 20 y.o. G2P0010 [redacted]w[redacted]d presenting for Induction of Labor for GDMA2. #IOL: s/p cytotec x2 (0616). Continue monitoring. Possibly attempt FB later.  #Pain: prn #FWB: cat 1 #GBS positive>PCN #GDMA2: Was on Metformin at home. Q4 glucose checks-last 87. Cont to monitor.   09-22-2002, MD, PGY1 [facility], [facility] 7:13 AM

## 2020-11-30 NOTE — Progress Notes (Addendum)
Bethany Avery is a 20 y.o. G2P0010 at [redacted]w[redacted]d by admitted for IOL for uncontrolled GDMA2  Subjective: Bethany Avery is doing well. She reports occasional cramping. She has no concerns or questions at this time.    Objective: BP 138/80   Pulse 98   Temp 98.7 F (37.1 C) (Oral)   Resp 18   Ht 5\' 5"  (1.651 m)   Wt 104.8 kg   LMP 03/07/2020   BMI 38.44 kg/m   FHT: 145 bpm, moderate variability, +accels, no decels Toco: occasional SVE:   Dilation: Fingertip Effacement (%): 60 Station: -3 Exam by:: [email or web address], CNM  Labs: Lab Results  Component Value Date   WBC 8.2 11/30/2020   HGB 11.4 (L) 11/30/2020   HCT 32.9 (L) 11/30/2020   MCV 91.6 11/30/2020   PLT 323 11/30/2020    Assessment / Plan: Bethany Avery 20 y.o. G2P0010 at [redacted]w[redacted]d admitted for IOL for uncontrolled GDMA2  Labor: Cervix unchanged. Fourth dose of cytotec given. Plan FB attempt at later time A2GDM: Was on Metformin at home. Q4H CBG's, last was 127. Patient just had lunch. Continue to monitor  Fetal Wellbeing: Category 1  Pain Control:  epidural prn  I/D:  GBS positive, PCN ordered for when in active labor Anticipated MOD: Anticipate SVD    [redacted]w[redacted]d, CNM 11/30/2020, 3:43 PM

## 2020-12-01 ENCOUNTER — Inpatient Hospital Stay (HOSPITAL_COMMUNITY): Payer: Medicaid Other | Admitting: Anesthesiology

## 2020-12-01 LAB — GLUCOSE, CAPILLARY
Glucose-Capillary: 90 mg/dL (ref 70–99)
Glucose-Capillary: 132 mg/dL — ABNORMAL HIGH (ref 70–99)
Glucose-Capillary: 104 mg/dL — ABNORMAL HIGH (ref 70–99)
Glucose-Capillary: 88 mg/dL (ref 70–99)
Glucose-Capillary: 66 mg/dL — ABNORMAL LOW (ref 70–99)
Glucose-Capillary: 91 mg/dL (ref 70–99)
Glucose-Capillary: 59 mg/dL — ABNORMAL LOW (ref 70–99)
Glucose-Capillary: 63 mg/dL — ABNORMAL LOW (ref 70–99)
Glucose-Capillary: 74 mg/dL (ref 70–99)
Glucose-Capillary: 103 mg/dL — ABNORMAL HIGH (ref 70–99)
Glucose-Capillary: 84 mg/dL (ref 70–99)
Glucose-Capillary: 107 mg/dL — ABNORMAL HIGH (ref 70–99)

## 2020-12-01 LAB — COMPREHENSIVE METABOLIC PANEL
ALT: 11 U/L (ref 0–44)
AST: 14 U/L — ABNORMAL LOW (ref 15–41)
Albumin: 2.6 g/dL — ABNORMAL LOW (ref 3.5–5.0)
Alkaline Phosphatase: 93 U/L (ref 38–126)
Anion gap: 8 (ref 5–15)
BUN: <5 mg/dL — ABNORMAL LOW (ref 6–20)
CO2: 21 mmol/L — ABNORMAL LOW (ref 22–32)
Calcium: 8.7 mg/dL — ABNORMAL LOW (ref 8.9–10.3)
Chloride: 106 mmol/L (ref 98–111)
Creatinine, Ser: 0.64 mg/dL (ref 0.44–1.00)
GFR, Estimated: >60 mL/min (ref >60)
Glucose, Bld: 86 mg/dL (ref 70–99)
Potassium: 4 mmol/L (ref 3.5–5.1)
Sodium: 135 mmol/L (ref 135–145)
Total Bilirubin: 0.5 mg/dL (ref 0.3–1.2)
Total Protein: 5.8 g/dL — ABNORMAL LOW (ref 6.5–8.1)

## 2020-12-01 LAB — PROTEIN / CREATININE RATIO, URINE
Creatinine, Urine: 61.63 mg/dL
Protein Creatinine Ratio: 0.15 mg/mg{Cre} (ref 0.00–0.15)
Total Protein, Urine: 9 mg/dL

## 2020-12-01 MED ORDER — FENTANYL-BUPIVACAINE-NACL 0.5-0.125-0.9 MG/250ML-% EP SOLN
EPIDURAL | Status: AC
  Filled 2020-12-01: qty 250

## 2020-12-01 MED ORDER — ACETAMINOPHEN 500 MG PO TABS: 1000.0000 mg | ORAL_TABLET | Freq: Once | ORAL | Status: DC

## 2020-12-01 MED ORDER — EPHEDRINE 5 MG/ML INJ: 10.0000 mg | INTRAVENOUS | Status: DC | PRN

## 2020-12-01 MED ORDER — SODIUM CHLORIDE 0.9 % IV SOLN
5.0000 10*6.[IU] | Freq: Once | INTRAVENOUS | Status: AC
  Administered 2020-12-01: 5 10*6.[IU] via INTRAVENOUS

## 2020-12-01 MED ORDER — FENTANYL-BUPIVACAINE-NACL 0.5-0.125-0.9 MG/250ML-% EP SOLN
EPIDURAL | Status: DC | PRN
  Administered 2020-12-01: 12 mL/h via EPIDURAL

## 2020-12-01 MED ORDER — PHENYLEPHRINE 40 MCG/ML (10ML) SYRINGE FOR IV PUSH (FOR BLOOD PRESSURE SUPPORT)
80.0000 ug | PREFILLED_SYRINGE | INTRAVENOUS | Status: DC | PRN
  Filled 2020-12-01 (×2): qty 10

## 2020-12-01 MED ORDER — LACTATED RINGERS IV SOLN: 500.0000 mL | Freq: Once | INTRAVENOUS | Status: DC

## 2020-12-01 MED ORDER — FENTANYL-BUPIVACAINE-NACL 0.5-0.125-0.9 MG/250ML-% EP SOLN: 12.0000 mL/h | EPIDURAL | Status: DC | PRN

## 2020-12-01 MED ORDER — TERBUTALINE SULFATE 1 MG/ML IJ SOLN: 0.2500 mg | Freq: Once | INTRAMUSCULAR | Status: DC | PRN

## 2020-12-01 MED ORDER — PENICILLIN G POT IN DEXTROSE 60000 UNIT/ML IV SOLN
3.0000 10*6.[IU] | INTRAVENOUS | Status: DC
  Administered 2020-12-01 – 2020-12-02 (×5): 3 10*6.[IU] via INTRAVENOUS
  Filled 2020-12-01 (×5): qty 50

## 2020-12-01 MED ORDER — PHENYLEPHRINE 40 MCG/ML (10ML) SYRINGE FOR IV PUSH (FOR BLOOD PRESSURE SUPPORT): 80.0000 ug | PREFILLED_SYRINGE | INTRAVENOUS | Status: DC | PRN

## 2020-12-01 MED ORDER — DIPHENHYDRAMINE HCL 50 MG/ML IJ SOLN: 12.5000 mg | INTRAMUSCULAR | Status: DC | PRN

## 2020-12-01 MED ORDER — LIDOCAINE HCL (PF) 1 % IJ SOLN
INTRAMUSCULAR | Status: DC | PRN
  Administered 2020-12-01: 5 mL via EPIDURAL

## 2020-12-01 MED ORDER — LACTATED RINGERS IV SOLN
500.0000 mL | Freq: Once | INTRAVENOUS | Status: AC
  Administered 2020-12-01: 500 mL via INTRAVENOUS

## 2020-12-01 MED ORDER — OXYTOCIN-SODIUM CHLORIDE 30-0.9 UT/500ML-% IV SOLN
1.0000 m[IU]/min | INTRAVENOUS | Status: DC
  Administered 2020-12-01: 2 m[IU]/min via INTRAVENOUS
  Administered 2020-12-02: 4 m[IU]/min via INTRAVENOUS

## 2020-12-01 NOTE — Progress Notes (Addendum)
Labor Progress Note Bethany Avery is a 20 y.o. G2P0010 at [redacted]w[redacted]d admitted for IOL for uncontrolled GDMA2. S: Patient was resting comfortably in bed with no new complaints.  O:  BP 119/66   Pulse 67   Temp 98.2 F (36.8 C) (Oral)   Resp 20   Ht 5\' 5"  (1.651 m)   Wt 104.8 kg   LMP 03/07/2020   BMI 38.44 kg/m  EFM: baseline 150 BPM/+accels/-decels/mod variability Toco: intermittent   CVE: Dilation: Fingertip Effacement (%): 70 Station: -2 Presentation: Vertex Exam by:: Michoel Kunin   A&P: 20 y.o. G2P0010 [redacted]w[redacted]d admitted for IOL for uncontrolled GDMA2 #IOL: Cervix remained unchanged on Dr. [redacted]w[redacted]d exam. FB was successfully inserted@0105 , and patient tolerated well. S/p cytotec x5 (2258). Cont to monitor.  #A2GDM: Was on Metformin at home. Q4H CBG's, last was 90. Continue to monitor Fetal Wellbeing: Category 1 Pain Control:  epidural prn ID: GBS positive, PCN ordered for when in active labor.   11-26-1979, MD, PGY1 [facility], [facility] 2:51 AM

## 2020-12-01 NOTE — Progress Notes (Addendum)
Labor Progress Note Kimbely Whiteaker is a 20 y.o. G2P0010 at [redacted]w[redacted]d admitted for IOL for uncontrolled GDMA2.  S: Patient is resting comfortably in bed.  O:  BP 130/66   Pulse 83   Temp 97.9 F (36.6 C) (Oral)   Resp 18   Ht 5\' 5"  (1.651 m)   Wt 104.8 kg   LMP 03/07/2020   BMI 38.44 kg/m  EFM: baseline 150 BPM/mod variability/+accels/-decels Toco: contractions have been intermittent, difficult to trace.   CVE: Dilation: 1 Effacement (%): 70 Station: -2 Presentation: Vertex Exam by:: Dr. [email or web address]   A&P: 20 y.o. G2P0010 [redacted]w[redacted]d admitted for IOL for uncontrolled GDMA2. #IOL: FB still in place (placed@0105 ). Cytotec x6 (0502). Rechecked cervix, gradually progressing.  #Pain: epidural #FWB: cat 1 #GBS positive>PCN #GDMA2: Was on Metformin at home. Q4 glucose checks-last 90.  #Elevated BPs: Pt had a few BP in 140s-150s/90s. Most recent is 130/66. Continue to monitor, other VSS.    [redacted]w[redacted]d, MD, PGY1 [facility], [facility] 5:08 AM

## 2020-12-01 NOTE — Anesthesia Procedure Notes (Signed)
Epidural Patient location during procedure: OB Start time: 12/01/2020 3:03 AM End time: 12/01/2020 3:21 AM  Staffing Anesthesiologist: Trevor Iha, MD Performed: anesthesiologist   Preanesthetic Checklist Completed: patient identified, IV checked, site marked, risks and benefits discussed, surgical consent, monitors and equipment checked, pre-op evaluation and timeout performed  Epidural Patient position: sitting Prep: DuraPrep and site prepped and draped Patient monitoring: continuous pulse ox and blood pressure Approach: midline Location: L3-L4 Injection technique: LOR air  Needle:  Needle type: Tuohy  Needle gauge: 17 G Needle length: 9 cm and 9 Needle insertion depth: 8 cm Catheter type: closed end flexible Catheter size: 19 Gauge Catheter at skin depth: 13 cm Test dose: negative  Assessment Events: blood not aspirated, injection not painful, no injection resistance, no paresthesia and negative IV test  Additional Notes Patient identified. Risks/Benefits/Options discussed with patient including but not limited to bleeding, infection, nerve damage, paralysis, failed block, incomplete pain control, headache, blood pressure changes, nausea, vomiting, reactions to medication both or allergic, itching and postpartum back pain. Confirmed with bedside nurse the patient's most recent platelet count. Confirmed with patient that they are not currently taking any anticoagulation, have any bleeding history or any family history of bleeding disorders. Patient expressed understanding and wished to proceed. All questions were answered. Sterile technique was used throughout the entire procedure. Please see nursing notes for vital signs. Test dose was given through epidural needle and negative prior to continuing to dose epidural or start infusion. Warning signs of high block given to the patient including shortness of breath, tingling/numbness in hands, complete motor block, or any  concerning symptoms with instructions to call for help. Patient was given instructions on fall risk and not to get out of bed. All questions and concerns addressed with instructions to call with any issues. 1 Attempt (S) . Patient tolerated procedure well.

## 2020-12-01 NOTE — Progress Notes (Signed)
Bethany Avery is a 20 y.o. G2P0010 at [redacted]w[redacted]d admitted for induction of labor due to Gestational diabetes.  Subjective:   Objective: BP 124/61   Pulse 86   Temp 98.2 F (36.8 C) (Oral)   Resp 18   Ht 5\' 5"  (1.651 m)   Wt 104.8 kg   LMP 03/07/2020   BMI 38.44 kg/m  No intake/output data recorded. Total I/O In: -  Out: 1250 [Urine:1250]  FHT:  FHR: 135 bpm, variability: moderate,  accelerations:  Present,  decelerations:  Absent UC:   regular, every 2-3 minutes SVE:   Dilation: 4 Effacement (%): 80 Station: -1 Exam by:: [email or web address], CNM IUPC replaced at this time for more accurate assessment of contractions  Labs: Lab Results  Component Value Date   WBC 8.2 11/30/2020   HGB 11.4 (L) 11/30/2020   HCT 32.9 (L) 11/30/2020   MCV 91.6 11/30/2020   PLT 323 11/30/2020    Assessment / Plan: Induction of labor due to gestational diabetes,  progressing well on pitocin  Labor:  Progressing normally in early labor, difficulty assessing contractions with IUPC, replaced during this exam. Preeclampsia:   isolated BPs in labor, labs wnl with P/C ratio pending Fetal Wellbeing:  Category I Pain Control:  Epidural I/D:   GBS positive on PCN Anticipated MOD:  NSVD  01/31/2021 12/01/2020, 3:34 PM

## 2020-12-01 NOTE — Progress Notes (Signed)
Bethany Avery is a 20 y.o. G2P0010 at [redacted]w[redacted]d by ultrasound admitted for induction of labor due to Gestational diabetes.  Subjective:   Objective: BP 115/66   Pulse 80   Temp 98 F (36.7 C) (Oral)   Resp 18   Ht 5\' 5"  (1.651 m)   Wt 104.8 kg   LMP 03/07/2020   BMI 38.44 kg/m  No intake/output data recorded. Total I/O In: -  Out: 1250 [Urine:1250]  FHT:  FHR: 140 bpm, variability: moderate,  accelerations:  Present,  decelerations:  Absent UC:   regular, every 3-4 minutes SVE:   Dilation: 3.5 Effacement (%): 80 Station: -1 Exam by:: [email or web address], CNM Large amount of clear fluid out with exam, cervix unchanged. IUPC not working well so position readjusted and now working effectively.  Labs: Lab Results  Component Value Date   WBC 8.2 11/30/2020   HGB 11.4 (L) 11/30/2020   HCT 32.9 (L) 11/30/2020   MCV 91.6 11/30/2020   PLT 323 11/30/2020    Assessment / Plan: Induction of labor due to GDM,  progressing well on pitocin  Labor: Progressing normally Preeclampsia:   n/a Fetal Wellbeing:  Category I Pain Control:  Epidural I/D:   GBS positive on PCN Anticipated MOD:  NSVD  01/31/2021 12/01/2020, 12:13 PM

## 2020-12-01 NOTE — Progress Notes (Signed)
Pt's BS below 70. Pt given apple juice and popsicle. Pt's BS rechecked and remained low at 59. Pt give cranberry juice and another popsicle. Pt alert and oriented x 4, GCS 15. Will recheck BS in 15 minutes.

## 2020-12-01 NOTE — Anesthesia Preprocedure Evaluation (Addendum)
Anesthesia Evaluation  Patient identified by MRN, date of birth, ID band Patient awake    Reviewed: Allergy & Precautions, NPO status , Patient's Chart, lab work & pertinent test results  Airway Mallampati: II  TM Distance: >3 FB Neck ROM: Full    Dental no notable dental hx. (+) Teeth Intact, Dental Advisory Given   Pulmonary neg pulmonary ROS,    Pulmonary exam normal breath sounds clear to auscultation       Cardiovascular hypertension, Normal cardiovascular exam Rhythm:Regular Rate:Normal     Neuro/Psych  Headaches, PSYCHIATRIC DISORDERS Anxiety Depression    GI/Hepatic negative GI ROS, Neg liver ROS,   Endo/Other  diabetes, Gestational  Renal/GU negative Renal ROS     Musculoskeletal   Abdominal (+) + obese,   Peds  Hematology Lab Results      Component                Value               Date                      WBC                      8.2                 11/30/2020                HGB                      11.4 (L)            11/30/2020                HCT                      32.9 (L)            11/30/2020                MCV                      91.6                11/30/2020                PLT                      323                 11/30/2020              Anesthesia Other Findings   Reproductive/Obstetrics (+) Pregnancy                            Anesthesia Physical Anesthesia Plan  ASA: 3  Anesthesia Plan: Epidural   Post-op Pain Management:    Induction:   PONV Risk Score and Plan: 4 or greater and Ondansetron, Dexamethasone, Treatment may vary due to age or medical condition and Scopolamine patch - Pre-op  Airway Management Planned: Natural Airway  Additional Equipment:   Intra-op Plan:   Post-operative Plan:   Informed Consent: I have reviewed the patients History and Physical, chart, labs and discussed the procedure including the risks, benefits and alternatives  for the proposed anesthesia with the patient or authorized representative who has indicated his/her understanding and  acceptance.       Plan Discussed with: CRNA  Anesthesia Plan Comments: (37.4 wk G2P0 w gDm an inc BMI for LeA)      Anesthesia Quick Evaluation

## 2020-12-01 NOTE — Progress Notes (Signed)
Bethany Avery is a 20 y.o. G2P0010 at [redacted]w[redacted]d by ultrasound admitted for induction of labor due to GDM poorly controlled.  Subjective: Pt comfortable with epidural. Family in room for support.  Objective: BP (!) 108/92   Pulse 96   Temp 98 F (36.7 C) (Oral)   Resp 18   Ht 5\' 5"  (1.651 m)   Wt 104.8 kg   LMP 03/07/2020   BMI 38.44 kg/m  No intake/output data recorded. No intake/output data recorded.  FHT:  FHR: 140 bpm, variability: moderate,  accelerations:  Present,  decelerations:  Absent UC:   regular, every 3-5 minutes, toco with intermittent tracing due to maternal position  SVE:   Dilation: 3.5 Effacement (%): 80 Station: -1 Exam by:: [email or web address], CNM BBOW with contraction. AROM with clear fluid and IUPC placed. Pt tolerated well.  Labs: Lab Results  Component Value Date   WBC 8.2 11/30/2020   HGB 11.4 (L) 11/30/2020   HCT 32.9 (L) 11/30/2020   MCV 91.6 11/30/2020   PLT 323 11/30/2020    Assessment / Plan: Induction of labor due to GDM with poor control S/P Cytotec and foley balloon  Labor: Progressing normally and to start Pitocin at 2 milliunits/increase by 2 per protocol Preeclampsia:   n/a Fetal Wellbeing:  Category I Pain Control:  Epidural I/D:   GBS positive, PCN held so to start now Anticipated MOD:  NSVD  01/31/2021 12/01/2020, 9:56 AM

## 2020-12-01 NOTE — Progress Notes (Signed)
Bethany Avery is a 20 y.o. G2P0010 at [redacted]w[redacted]d admitted for induction of labor due to Gestational diabetes.  Subjective:   Objective: BP 125/80   Pulse 85   Temp (!) 97.5 F (36.4 C) (Oral)   Resp 18   Ht 5\' 5"  (1.651 m)   Wt 104.8 kg   LMP 03/07/2020   BMI 38.44 kg/m  I/O last 3 completed shifts: In: -  Out: 2125 [Urine:2125] No intake/output data recorded.  FHT:  FHR: 140 bpm, variability: moderate,  accelerations:  Present,  decelerations:  Absent UC:   irregular, every 2-5 minutes SVE:   Dilation: 4 Effacement (%): 80 Station: -1 Exam by:: [email or web address] CNM ROT position, asynclitic to the left  Bedside Arbie Cookey shows fetal spine posterior   Labs: Lab Results  Component Value Date   WBC 8.2 11/30/2020   HGB 11.4 (L) 11/30/2020   HCT 32.9 (L) 11/30/2020   MCV 91.6 11/30/2020   PLT 323 11/30/2020    Assessment / Plan: Induction of labor due to gestational diabetes,  progressing well on pitocin  Labor:  Fetal malposition noted, inadequate contractions.  Maternal positioning to encourage fetal rotation/descent and continue to titrate Pitocin.  Preeclampsia:  labs stable Fetal Wellbeing:  Category I Pain Control:  Epidural I/D:   GBS positive Anticipated MOD:  NSVD  01/31/2021 12/01/2020, 8:40 PM

## 2020-12-01 NOTE — Progress Notes (Signed)
Bethany Avery is a 20 y.o. G2P0010 at [redacted]w[redacted]d admitted for induction of labor due to Gestational diabetes.  Subjective: Pt comfortable with epidural. She reports shaking and new onset headache.  She is also frustrated with lack of progress. S/O at bedside for support.  Objective: BP 119/76 (BP Location: Right Arm)   Pulse 86   Temp (!) 97.5 F (36.4 C) (Oral)   Resp 16   Ht 5\' 5"  (1.651 m)   Wt 104.8 kg   LMP 03/07/2020   BMI 38.44 kg/m  No intake/output data recorded. Total I/O In: -  Out: 2125 [Urine:2125]  FHT:  FHR: 140 bpm, variability: moderate,  accelerations:  Present,  decelerations:  Absent UC:   irregular, every 2-5 minutes SVE:   Dilation: 4 Effacement (%): 60, 70 Station: -1 Exam by:: [email or web address] RN  Labs: Lab Results  Component Value Date   WBC 8.2 11/30/2020   HGB 11.4 (L) 11/30/2020   HCT 32.9 (L) 11/30/2020   MCV 91.6 11/30/2020   PLT 323 11/30/2020    Assessment / Plan: Induction of labor due to gestational diabetes S/P Cytotec and foley balloon  Labor: Progressing normally. BP have been mostly wnl, PEC labs wnl.  Discussed options with pt including continuing to increase Pitocin and change maternal position to aid descent/labor progress; taking a break and letting pt eat something light then resuming Pitocin in 2-4 hours; or proceeding with cesarean section for failure to progress.  Discussed expected time between foley bulb coming out and cervical change and pt is within normal limits.  Pt states understanding and agrees to continue with induction and Pitocin titration by RN at this time.  Tylenol ordered for h/a. Preeclampsia:  labs stable Fetal Wellbeing:  Category I Pain Control:  Epidural I/D:   GBS positive on PCN Anticipated MOD:  NSVD  01/31/2021 12/01/2020, 5:51 PM

## 2020-12-02 ENCOUNTER — Encounter (HOSPITAL_COMMUNITY): Payer: Self-pay | Admitting: Family Medicine

## 2020-12-02 ENCOUNTER — Encounter (HOSPITAL_COMMUNITY)
Admission: AD | Disposition: A | Discharge status: Home / Self Care | Payer: Self-pay | Source: Home / Self Care | Attending: Obstetrics & Gynecology

## 2020-12-02 DIAGNOSIS — O4290 Premature rupture of membranes, unspecified as to length of time between rupture and onset of labor, unspecified weeks of gestation: Secondary | ICD-10-CM

## 2020-12-02 DIAGNOSIS — O99824 Streptococcus B carrier state complicating childbirth: Secondary | ICD-10-CM

## 2020-12-02 DIAGNOSIS — O134 Gestational [pregnancy-induced] hypertension without significant proteinuria, complicating childbirth: Secondary | ICD-10-CM

## 2020-12-02 DIAGNOSIS — Z3A37 37 weeks gestation of pregnancy: Secondary | ICD-10-CM

## 2020-12-02 DIAGNOSIS — O24425 Gestational diabetes mellitus in childbirth, controlled by oral hypoglycemic drugs: Secondary | ICD-10-CM

## 2020-12-02 HISTORY — DX: Premature rupture of membranes, unspecified as to length of time between rupture and onset of labor, unspecified weeks of gestation: O42.90

## 2020-12-02 LAB — COMPREHENSIVE METABOLIC PANEL
ALT: 12 U/L (ref 0–44)
AST: 15 U/L (ref 15–41)
Albumin: 2.4 g/dL — ABNORMAL LOW (ref 3.5–5.0)
Alkaline Phosphatase: 95 U/L (ref 38–126)
Anion gap: 7 (ref 5–15)
BUN: <5 mg/dL — ABNORMAL LOW (ref 6–20)
CO2: 22 mmol/L (ref 22–32)
Calcium: 8.5 mg/dL — ABNORMAL LOW (ref 8.9–10.3)
Chloride: 105 mmol/L (ref 98–111)
Creatinine, Ser: 0.75 mg/dL (ref 0.44–1.00)
GFR, Estimated: >60 mL/min (ref >60)
Glucose, Bld: 126 mg/dL — ABNORMAL HIGH (ref 70–99)
Potassium: 3.8 mmol/L (ref 3.5–5.1)
Sodium: 134 mmol/L — ABNORMAL LOW (ref 135–145)
Total Bilirubin: 0.6 mg/dL (ref 0.3–1.2)
Total Protein: 5.8 g/dL — ABNORMAL LOW (ref 6.5–8.1)

## 2020-12-02 LAB — CBC
HCT: 35.1 % — ABNORMAL LOW (ref 36.0–46.0)
Hemoglobin: 12.1 g/dL (ref 12.0–15.0)
MCH: 31.9 pg (ref 26.0–34.0)
MCHC: 34.5 g/dL (ref 30.0–36.0)
MCV: 92.6 fL (ref 80.0–100.0)
Platelets: 306 10*3/uL (ref 150–400)
RBC: 3.79 MIL/uL — ABNORMAL LOW (ref 3.87–5.11)
RDW: 12.3 % (ref 11.5–15.5)
WBC: 21.4 10*3/uL — ABNORMAL HIGH (ref 4.0–10.5)
nRBC: 0 % (ref 0.0–0.2)

## 2020-12-02 LAB — GLUCOSE, CAPILLARY
Glucose-Capillary: 91 mg/dL (ref 70–99)
Glucose-Capillary: 82 mg/dL (ref 70–99)
Glucose-Capillary: 119 mg/dL — ABNORMAL HIGH (ref 70–99)
Glucose-Capillary: 80 mg/dL (ref 70–99)
Glucose-Capillary: 72 mg/dL (ref 70–99)

## 2020-12-02 SURGERY — Surgical Case
Anesthesia: Epidural | Site: Abdomen | Wound class: Clean Contaminated

## 2020-12-02 MED ORDER — NALBUPHINE HCL 10 MG/ML IJ SOLN: 5.0000 mg | Freq: Once | INTRAMUSCULAR | Status: AC | PRN

## 2020-12-02 MED ORDER — DIBUCAINE (PERIANAL) 1 % EX OINT: 1.0000 "application " | TOPICAL_OINTMENT | CUTANEOUS | Status: DC | PRN

## 2020-12-02 MED ORDER — KETOROLAC TROMETHAMINE 30 MG/ML IJ SOLN
30.0000 mg | Freq: Once | INTRAMUSCULAR | Status: AC | PRN
  Administered 2020-12-02: 30 mg via INTRAVENOUS

## 2020-12-02 MED ORDER — OXYTOCIN-SODIUM CHLORIDE 30-0.9 UT/500ML-% IV SOLN
2.5000 [IU]/h | INTRAVENOUS | Status: DC
  Filled 2020-12-02: qty 500

## 2020-12-02 MED ORDER — DEXAMETHASONE SODIUM PHOSPHATE 4 MG/ML IJ SOLN
INTRAMUSCULAR | Status: DC | PRN
  Administered 2020-12-02: 4 mg via INTRAVENOUS

## 2020-12-02 MED ORDER — PRENATAL MULTIVITAMIN CH
1.0000 | ORAL_TABLET | Freq: Every day | ORAL | Status: DC
  Administered 2020-12-03 – 2020-12-05 (×3): 1 via ORAL
  Filled 2020-12-02 (×3): qty 1

## 2020-12-02 MED ORDER — NALOXONE HCL 0.4 MG/ML IJ SOLN: 0.4000 mg | INTRAMUSCULAR | Status: DC | PRN

## 2020-12-02 MED ORDER — OXYCODONE HCL 5 MG PO TABS
5.0000 mg | ORAL_TABLET | ORAL | Status: DC | PRN
Start: 2020-12-02 — End: 2020-12-05
  Administered 2020-12-03: 5 mg via ORAL
  Administered 2020-12-04 (×3): 10 mg via ORAL
  Administered 2020-12-05: 5 mg via ORAL
  Filled 2020-12-02: qty 2
  Filled 2020-12-02: qty 1
  Filled 2020-12-02: qty 2
  Filled 2020-12-02: qty 1
  Filled 2020-12-02: qty 2

## 2020-12-02 MED ORDER — TETANUS-DIPHTH-ACELL PERTUSSIS 5-2.5-18.5 LF-MCG/0.5 IM SUSY
0.5000 mL | PREFILLED_SYRINGE | Freq: Once | INTRAMUSCULAR | Status: AC
  Administered 2020-12-03: 0.5 mL via INTRAMUSCULAR
  Filled 2020-12-02: qty 0.5

## 2020-12-02 MED ORDER — NALBUPHINE HCL 10 MG/ML IJ SOLN
5.0000 mg | Freq: Once | INTRAMUSCULAR | Status: AC | PRN
Start: 2020-12-02 — End: 2020-12-02
  Administered 2020-12-02: 5 mg via INTRAVENOUS
  Filled 2020-12-02: qty 1

## 2020-12-02 MED ORDER — CYCLOBENZAPRINE HCL 5 MG PO TABS
10.0000 mg | ORAL_TABLET | Freq: Once | ORAL | Status: AC
  Administered 2020-12-02: 10 mg via ORAL
  Filled 2020-12-02: qty 2

## 2020-12-02 MED ORDER — ONDANSETRON HCL 4 MG/2ML IJ SOLN: 4.0000 mg | Freq: Three times a day (TID) | INTRAMUSCULAR | Status: DC | PRN

## 2020-12-02 MED ORDER — OXYTOCIN-SODIUM CHLORIDE 30-0.9 UT/500ML-% IV SOLN
INTRAVENOUS | Status: AC
  Filled 2020-12-02: qty 500

## 2020-12-02 MED ORDER — SODIUM CHLORIDE 0.9% FLUSH: 3.0000 mL | INTRAVENOUS | Status: DC | PRN

## 2020-12-02 MED ORDER — ENOXAPARIN SODIUM 60 MG/0.6ML IJ SOSY
50.0000 mg | PREFILLED_SYRINGE | INTRAMUSCULAR | Status: DC
  Administered 2020-12-03 – 2020-12-04 (×2): 50 mg via SUBCUTANEOUS
  Filled 2020-12-02 (×2): qty 0.6

## 2020-12-02 MED ORDER — WITCH HAZEL-GLYCERIN EX PADS: 1.0000 "application " | MEDICATED_PAD | CUTANEOUS | Status: DC | PRN

## 2020-12-02 MED ORDER — OXYTOCIN-SODIUM CHLORIDE 30-0.9 UT/500ML-% IV SOLN
INTRAVENOUS | Status: DC | PRN
  Administered 2020-12-02: 300 mL via INTRAVENOUS

## 2020-12-02 MED ORDER — PHENYLEPHRINE 40 MCG/ML (10ML) SYRINGE FOR IV PUSH (FOR BLOOD PRESSURE SUPPORT)
PREFILLED_SYRINGE | INTRAVENOUS | Status: AC
  Filled 2020-12-02: qty 10

## 2020-12-02 MED ORDER — KETOROLAC TROMETHAMINE 30 MG/ML IJ SOLN: 30.0000 mg | Freq: Once | INTRAMUSCULAR | Status: DC

## 2020-12-02 MED ORDER — TRANEXAMIC ACID-NACL 1000-0.7 MG/100ML-% IV SOLN
INTRAVENOUS | Status: AC
  Filled 2020-12-02: qty 100

## 2020-12-02 MED ORDER — HYDROMORPHONE HCL 1 MG/ML IJ SOLN: 0.2500 mg | INTRAMUSCULAR | Status: DC | PRN

## 2020-12-02 MED ORDER — TRANEXAMIC ACID-NACL 1000-0.7 MG/100ML-% IV SOLN
INTRAVENOUS | Status: DC | PRN
  Administered 2020-12-02: 1000 mg via INTRAVENOUS

## 2020-12-02 MED ORDER — MORPHINE SULFATE (PF) 0.5 MG/ML IJ SOLN
INTRAMUSCULAR | Status: AC
  Filled 2020-12-02: qty 10

## 2020-12-02 MED ORDER — SODIUM CHLORIDE 0.9 % IV SOLN
500.0000 mg | Freq: Once | INTRAVENOUS | Status: AC
  Administered 2020-12-02: 500 mg via INTRAVENOUS

## 2020-12-02 MED ORDER — METFORMIN HCL 500 MG PO TABS: 500.0000 mg | ORAL_TABLET | Freq: Every day | ORAL | Status: DC

## 2020-12-02 MED ORDER — ACETAMINOPHEN 325 MG PO TABS: 650.0000 mg | ORAL_TABLET | ORAL | Status: DC | PRN

## 2020-12-02 MED ORDER — SODIUM CHLORIDE 0.9 % IV SOLN
INTRAVENOUS | Status: AC
  Filled 2020-12-02: qty 500

## 2020-12-02 MED ORDER — SODIUM CHLORIDE 0.9 % IR SOLN
Status: DC | PRN
  Administered 2020-12-02: 1000 mL

## 2020-12-02 MED ORDER — MENTHOL 3 MG MT LOZG: 1.0000 | LOZENGE | OROMUCOSAL | Status: DC | PRN

## 2020-12-02 MED ORDER — KETOROLAC TROMETHAMINE 30 MG/ML IJ SOLN
30.0000 mg | Freq: Four times a day (QID) | INTRAMUSCULAR | Status: DC
  Administered 2020-12-02 – 2020-12-03 (×2): 30 mg via INTRAVENOUS
  Filled 2020-12-02 (×2): qty 1

## 2020-12-02 MED ORDER — SIMETHICONE 80 MG PO CHEW
80.0000 mg | CHEWABLE_TABLET | Freq: Three times a day (TID) | ORAL | Status: DC
  Administered 2020-12-02 – 2020-12-05 (×9): 80 mg via ORAL
  Filled 2020-12-02 (×8): qty 1

## 2020-12-02 MED ORDER — MEPERIDINE HCL 25 MG/ML IJ SOLN
INTRAMUSCULAR | Status: AC
  Filled 2020-12-02: qty 1

## 2020-12-02 MED ORDER — MEPERIDINE HCL 25 MG/ML IJ SOLN: 6.2500 mg | INTRAMUSCULAR | Status: DC | PRN

## 2020-12-02 MED ORDER — ACETAMINOPHEN 10 MG/ML IV SOLN
INTRAVENOUS | Status: AC
  Filled 2020-12-02: qty 100

## 2020-12-02 MED ORDER — LACTATED RINGERS IV SOLN: INTRAVENOUS | Status: DC

## 2020-12-02 MED ORDER — CEFAZOLIN SODIUM-DEXTROSE 2-3 GM-%(50ML) IV SOLR
INTRAVENOUS | Status: DC | PRN
  Administered 2020-12-02: 2 g via INTRAVENOUS

## 2020-12-02 MED ORDER — DEXAMETHASONE SODIUM PHOSPHATE 4 MG/ML IJ SOLN
INTRAMUSCULAR | Status: AC
  Filled 2020-12-02: qty 1

## 2020-12-02 MED ORDER — STERILE WATER FOR IRRIGATION IR SOLN
Status: DC | PRN
  Administered 2020-12-02: 1000 mL

## 2020-12-02 MED ORDER — ONDANSETRON HCL 4 MG/2ML IJ SOLN
INTRAMUSCULAR | Status: AC
  Filled 2020-12-02: qty 2

## 2020-12-02 MED ORDER — MORPHINE SULFATE (PF) 0.5 MG/ML IJ SOLN
INTRAMUSCULAR | Status: DC | PRN
  Administered 2020-12-02: 3 mg via EPIDURAL

## 2020-12-02 MED ORDER — CEFAZOLIN SODIUM-DEXTROSE 1-4 GM/50ML-% IV SOLN
1.0000 g | Freq: Once | INTRAVENOUS | Status: DC
  Filled 2020-12-02: qty 50

## 2020-12-02 MED ORDER — COCONUT OIL OIL
1.0000 "application " | TOPICAL_OIL | Status: DC | PRN
  Administered 2020-12-04: 1 via TOPICAL

## 2020-12-02 MED ORDER — FENTANYL CITRATE (PF) 100 MCG/2ML IJ SOLN: INTRAMUSCULAR | Status: DC | PRN

## 2020-12-02 MED ORDER — SENNOSIDES-DOCUSATE SODIUM 8.6-50 MG PO TABS
2.0000 | ORAL_TABLET | ORAL | Status: DC
  Administered 2020-12-03 – 2020-12-05 (×3): 2 via ORAL
  Filled 2020-12-02 (×3): qty 2

## 2020-12-02 MED ORDER — MEPERIDINE HCL 25 MG/ML IJ SOLN
INTRAMUSCULAR | Status: DC | PRN
  Administered 2020-12-02 (×2): 12.5 mg via INTRAVENOUS

## 2020-12-02 MED ORDER — ACETAMINOPHEN 10 MG/ML IV SOLN
INTRAVENOUS | Status: DC | PRN
  Administered 2020-12-02: 1000 mg via INTRAVENOUS

## 2020-12-02 MED ORDER — IBUPROFEN 600 MG PO TABS: 600.0000 mg | ORAL_TABLET | Freq: Four times a day (QID) | ORAL | Status: DC

## 2020-12-02 MED ORDER — DIPHENHYDRAMINE HCL 25 MG PO CAPS
25.0000 mg | ORAL_CAPSULE | ORAL | Status: DC | PRN
  Filled 2020-12-02: qty 1

## 2020-12-02 MED ORDER — SCOPOLAMINE 1 MG/3DAYS TD PT72: 1.0000 | MEDICATED_PATCH | Freq: Once | TRANSDERMAL | Status: DC

## 2020-12-02 MED ORDER — NALBUPHINE HCL 10 MG/ML IJ SOLN
5.0000 mg | INTRAMUSCULAR | Status: DC | PRN
  Filled 2020-12-02: qty 1

## 2020-12-02 MED ORDER — MEASLES, MUMPS & RUBELLA VAC IJ SOLR: 0.5000 mL | Freq: Once | INTRAMUSCULAR | Status: DC

## 2020-12-02 MED ORDER — NALOXONE HCL 4 MG/10ML IJ SOLN
1.0000 ug/kg/h | INTRAVENOUS | Status: DC | PRN
  Filled 2020-12-02: qty 5

## 2020-12-02 MED ORDER — ONDANSETRON HCL 4 MG/2ML IJ SOLN
INTRAMUSCULAR | Status: DC | PRN
  Administered 2020-12-02: 4 mg via INTRAVENOUS

## 2020-12-02 MED ORDER — DIPHENHYDRAMINE HCL 50 MG/ML IJ SOLN: 12.5000 mg | INTRAMUSCULAR | Status: DC | PRN

## 2020-12-02 MED ORDER — PROMETHAZINE HCL 25 MG/ML IJ SOLN: 6.2500 mg | INTRAMUSCULAR | Status: DC | PRN

## 2020-12-02 MED ORDER — DIPHENHYDRAMINE HCL 25 MG PO CAPS
25.0000 mg | ORAL_CAPSULE | Freq: Four times a day (QID) | ORAL | Status: DC | PRN
  Administered 2020-12-03: 25 mg via ORAL

## 2020-12-02 MED ORDER — SIMETHICONE 80 MG PO CHEW
80.0000 mg | CHEWABLE_TABLET | ORAL | Status: DC | PRN
  Administered 2020-12-04: 80 mg via ORAL
  Filled 2020-12-02 (×3): qty 1

## 2020-12-02 MED ORDER — LIDOCAINE-EPINEPHRINE (PF) 2 %-1:200000 IJ SOLN
INTRAMUSCULAR | Status: DC | PRN
  Administered 2020-12-02 (×2): 5 mL via EPIDURAL

## 2020-12-02 MED ORDER — NALBUPHINE HCL 10 MG/ML IJ SOLN
5.0000 mg | INTRAMUSCULAR | Status: DC | PRN
  Administered 2020-12-03: 5 mg via INTRAVENOUS
  Filled 2020-12-02: qty 1

## 2020-12-02 SURGICAL SUPPLY — 36 items
CHLORAPREP W/TINT 26ML (MISCELLANEOUS) ×2 IMPLANT
CLAMP CORD UMBIL (MISCELLANEOUS) IMPLANT
CLOTH BEACON ORANGE TIMEOUT ST (SAFETY) ×2 IMPLANT
DRSG OPSITE POSTOP 4X10 (GAUZE/BANDAGES/DRESSINGS) ×2 IMPLANT
ELECT REM PT RETURN 9FT ADLT (ELECTROSURGICAL) ×2
ELECTRODE REM PT RTRN 9FT ADLT (ELECTROSURGICAL) ×1 IMPLANT
EXTRACTOR VACUUM BELL STYLE (SUCTIONS) IMPLANT
GAUZE SPONGE 4X4 12PLY STRL LF (GAUZE/BANDAGES/DRESSINGS) ×4 IMPLANT
GLOVE BIOGEL PI IND STRL 7.0 (GLOVE) ×2 IMPLANT
GLOVE BIOGEL PI INDICATOR 7.0 (GLOVE) ×2
GLOVE ECLIPSE 7.0 STRL STRAW (GLOVE) ×2 IMPLANT
GOWN STRL REUS W/TWL LRG LVL3 (GOWN DISPOSABLE) ×4 IMPLANT
KIT ABG SYR 3ML LUER SLIP (SYRINGE) ×2 IMPLANT
NEEDLE HYPO 25X5/8 SAFETYGLIDE (NEEDLE) ×2 IMPLANT
NS IRRIG 1000ML POUR BTL (IV SOLUTION) ×2 IMPLANT
PACK C SECTION WH (CUSTOM PROCEDURE TRAY) ×2 IMPLANT
PAD ABD 7.5X8 STRL (GAUZE/BANDAGES/DRESSINGS) ×2 IMPLANT
PAD OB MATERNITY 4.3X12.25 (PERSONAL CARE ITEMS) ×2 IMPLANT
PENCIL SMOKE EVAC W/HOLSTER (ELECTROSURGICAL) ×2 IMPLANT
RTRCTR C-SECT PINK 25CM LRG (MISCELLANEOUS) ×2 IMPLANT
SUT MNCRL 0 VIOLET CTX 36 (SUTURE) ×2 IMPLANT
SUT MON AB 3-0 SH 27 (SUTURE) ×2
SUT MON AB 3-0 SH27 (SUTURE) ×1 IMPLANT
SUT MONOCRYL 0 CTX 36 (SUTURE) ×4
SUT PLAIN 0 NONE (SUTURE) IMPLANT
SUT PLAIN 2 0 (SUTURE)
SUT PLAIN 2 0 XLH (SUTURE) IMPLANT
SUT PLAIN ABS 2-0 CT1 27XMFL (SUTURE) IMPLANT
SUT VIC AB 0 CTX 36 (SUTURE) ×2
SUT VIC AB 0 CTX36XBRD ANBCTRL (SUTURE) ×1 IMPLANT
SUT VIC AB 2-0 CT1 27 (SUTURE) ×2
SUT VIC AB 2-0 CT1 TAPERPNT 27 (SUTURE) ×1 IMPLANT
SUT VIC AB 4-0 KS 27 (SUTURE) ×2 IMPLANT
TOWEL OR 17X24 6PK STRL BLUE (TOWEL DISPOSABLE) ×2 IMPLANT
TRAY FOLEY W/BAG SLVR 14FR LF (SET/KITS/TRAYS/PACK) IMPLANT
WATER STERILE IRR 1000ML POUR (IV SOLUTION) ×2 IMPLANT

## 2020-12-02 NOTE — Anesthesia Postprocedure Evaluation (Signed)
Anesthesia Post Note  Patient: Bethany Avery  Procedure(s) Performed: CESAREAN SECTION (Abdomen)     Patient location during evaluation: PACU Anesthesia Type: Epidural Level of consciousness: awake and alert and oriented Pain management: pain level controlled Vital Signs Assessment: post-procedure vital signs reviewed and stable Respiratory status: spontaneous breathing, nonlabored ventilation and respiratory function stable Cardiovascular status: blood pressure returned to baseline and stable Postop Assessment: no headache, no backache, patient able to bend at knees and epidural receding Anesthetic complications: no   No notable events documented.  Last Vitals:  Vitals:   12/02/20 1528 12/02/20 1646  BP: (!) 116/57 137/67  Pulse: 82 81  Resp: 18 16  Temp: 37 C 37.2 C  SpO2: 100% 98%    Last Pain:  Vitals:   12/02/20 1830  TempSrc:   PainSc: 0-No pain   Pain Goal: Patients Stated Pain Goal: 0 (12/02/20 0904)              Epidural/Spinal Function Cutaneous sensation: Normal sensation (12/02/20 1830), Patient able to flex knees: Yes (12/02/20 1830), Patient able to lift hips off bed: Yes (12/02/20 1830), Back pain beyond tenderness at insertion site: No (12/02/20 1830), Progressively worsening motor and/or sensory loss: No (12/02/20 1830), Bowel and/or bladder incontinence post epidural: No (12/02/20 1830)  Lannie Fields

## 2020-12-02 NOTE — Progress Notes (Signed)
Labor Progress Note Bethany Avery is a 20 y.o. G2P0010 at [redacted]w[redacted]d presented 7/9 for IOL-poorly controlled A2GDM. S: Doing well without complaints.  O:  BP (!) 109/55   Pulse 96   Temp 98.5 F (36.9 C) (Axillary)   Resp 15   Ht 5\' 5"  (1.651 m)   Wt 104.8 kg   LMP 03/07/2020   BMI 38.44 kg/m  EFM: baseline 140bpm/mod variability/+accels/no decels Toco: q3-5 min  CVE: Dilation: 5 Effacement (%): 80, 90 Station: -1 Presentation: Vertex Exam by:: Allee Md.   A&P: 20 y.o. G2P0010 [redacted]w[redacted]d presented 7/9 for IOL-poorly controlled A2GDM. #IOL: S/p cyto x6, FB. AROM/IUPC/Pit @1000  on 7/10. Given patient's cervical exam was unchanged at 5cm, decision made overnight with Dr. and 9/10 to initiate pitocin break. Pitocin restarted at 0730, currently at 70mL/hr. Will continue to titrate and recheck in 4-6 hours, if patient unchanged will proceed with cesarean section. Discussed with patient and Dr. Philipp Deputy, everyone in agreement. #Pain: PRN #FWB: cat 1 #GBS positive, PCN, adequate prophylaxis #A2GDM: q4 CBG checks in latent labor and q2 in active labor.  #gHTN: diagnosed on admit, preE labs nml. Patient with headache, given tylenol, will give flexeril which has been effective in the past. No severe range BP, continue to monitor. Monitor headache.  18m, MD 11:05 AM

## 2020-12-02 NOTE — Progress Notes (Signed)
Patient has been ruptured for over 24 hours, has approximately 18h of pitocin without any cervical change and inadequate MVU's. On a 4 hour pitocin break and resumed around 0730, patient continues to have inadequate contractions and no cervical change.   The risks of cesarean section discussed with the patient included but were not limited to: bleeding which may require transfusion or reoperation; infection which may require antibiotics; injury to bowel, bladder, ureters or other surrounding organs; injury to the fetus; need for additional procedures including hysterectomy in the event of a life-threatening hemorrhage; placental abnormalities with subsequent pregnancies, incisional problems, thromboembolic phenomenon and other postoperative/anesthesia complications. The patient concurred with the proposed plan, giving informed written consent for the procedure. Patient NPO status waived given urgency of case. Anesthesia and OR aware. Preoperative prophylactic antibiotics and SCDs ordered on call to the OR.

## 2020-12-02 NOTE — Progress Notes (Signed)
Labor Progress Note Bethany Avery is a 20 y.o. G2P0010 at [redacted]w[redacted]d presented 7/9 for IOL-poorly controlled A2GDM. S: Doing well without complaints. Not feeling contractions.  O:  BP 116/62   Pulse 97   Temp 98.5 F (36.9 C) (Axillary)   Resp 14   Ht 5\' 5"  (1.651 m)   Wt 104.8 kg   LMP 03/07/2020   BMI 38.44 kg/m  EFM: baseline 150bpm/mod variability/+accels/no decels Toco: q1-5 min  CVE: Dilation: 5 Effacement (%): 80 Station: -1 Presentation: Vertex Exam by:: Dr [email or web address]   A&P: 20 y.o. G2P0010 [redacted]w[redacted]d presented 7/9 for IOL-poorly controlled A2GDM. #IOL: S/p cyto x6, FB. AROM/IUPC/Pit @1000  on 7/10. Given patient's cervical exam was unchanged at 5cm, decision made overnight with Dr. and 9/10 to initiate pitocin break. Pitocin restarted at 0730, currently at 59mL/hr. Patient with no cervical change, discussed pLTCS, patient ammendable.  The risks of cesarean section were discussed with the patient including but were not limited to: bleeding which may require transfusion or reoperation; infection which may require antibiotics; injury to bowel, bladder, ureters or other surrounding organs; injury to the fetus; need for additional procedures including hysterectomy in the event of a life-threatening hemorrhage; placental abnormalities wth subsequent pregnancies, incisional problems, thromboembolic phenomenon and other postoperative/anesthesia complications.  Anesthesia and OR aware.  Preoperative prophylactic antibiotics and SCDs ordered on call to the OR.  To OR when ready.  #Pain: epidural #FWB: cat 1 #GBS positive, PCN, adequate prophylaxis #A2GDM: q4 CBG checks in latent labor and q2 in active labor.  #gHTN: diagnosed on admit, preE labs nml. Patient with headache, given tylenol, will give flexeril which has been effective in the past. No severe range BP, continue to monitor. Monitor headache.  Philipp Deputy, MD 11:59 AM

## 2020-12-02 NOTE — Transfer of Care (Signed)
Immediate Anesthesia Transfer of Care Note  Patient: Bethany Avery  Procedure(s) Performed: CESAREAN SECTION (Abdomen)  Patient Location: PACU  Anesthesia Type:Epidural  Level of Consciousness: awake  Airway & Oxygen Therapy: Patient Spontanous Breathing  Post-op Assessment: Report given to RN  Post vital signs: Reviewed and stable  Last Vitals:  Vitals Value Taken Time  BP 137/84 12/02/20 1406  Temp    Pulse 100 12/02/20 1412  Resp 18 12/02/20 1412  SpO2 100 % 12/02/20 1412  Vitals shown include unvalidated device data.  Last Pain:  Vitals:   12/02/20 1219  TempSrc: Axillary  PainSc:       Patients Stated Pain Goal: 0 (12/02/20 0904)  Complications: No notable events documented.

## 2020-12-02 NOTE — Discharge Summary (Signed)
Postpartum Discharge Summary  Date of Service updated 12/05/20     Patient Name: Bethany Avery DOB: May 24, 2001 MRN: [id number]  Date of admission: 11/30/2020 Delivery date:12/02/2020  Delivering provider: Clarnce Flock  Date of discharge: 12/05/2020  Admitting diagnosis: Indication for care or intervention in labor or delivery [O75.9] Pregnant [Z34.90] Intrauterine pregnancy: [redacted]w[redacted]d     Secondary diagnosis:  Active Problems:   Gonorrhea   Adjustment disorder with mixed anxiety and depressed mood   Supervision of high risk pregnancy, antepartum   Carrier of spinal muscular atrophy   Gestational hypertension   Gestational diabetes mellitus, class A2   Positive GBS test   Indication for care or intervention in labor or delivery   Pregnant   Cesarean delivery delivered   Prolonged rupture of membranes  Additional problems: none    Discharge diagnosis: Term Pregnancy Delivered, Gestational Hypertension, and GDM A2                                              Post partum procedures: n/a Augmentation: AROM, Pitocin, Cytotec, and IP Foley Complications: INO>67 hours  Hospital course: Induction of Labor With Cesarean Section   20 y.o. yo G2P1011 at [redacted]w[redacted]d was admitted to the hospital 11/30/2020 for induction of labor. Patient had a labor course significant for initiation of IOL with cytotec x6 and FB. AROM was later performed and patient started on pitocin with IUPC placed. Despite pitocin break patient failed to make cervical change past 5cm and decision was made to proceed with cesarean section, due to ROM >24 hours, and on pitocin. The patient went for cesarean section due to  failure to progress . Delivery details are as follows: Membrane Rupture Time/Date: 1:07 PM ,12/02/2020   Delivery Method:C-Section, Low Transverse  Details of operation can be found in separate operative Note.  Patient had an uncomplicated postpartum course. She is ambulating, tolerating a regular  diet, passing flatus, and urinating well. Patient is discharged home in stable condition on 12/05/20.      Newborn Data: Birth date:12/02/2020  Birth time:1:08 PM  Gender:Female  Living status:Living  Apgars:8 ,9  Weight:3255 g                                Magnesium Sulfate received: No BMZ received: No Rhophylac:N/A MMR:N/A T-DaP:Given prenatally Flu: No Transfusion:No  Physical exam  Vitals:   12/04/20 1324 12/04/20 1821 12/04/20 2034 12/05/20 0508  BP: (!) 147/90 (!) 141/88 132/79 136/90  Pulse: 85 99 91 91  Resp: $Remo'18  18 16  'BguWS$ Temp: 98.4 F (36.9 C)  98.3 F (36.8 C) 98.5 F (36.9 C)  TempSrc: Oral  Oral Oral  SpO2: 100% 100% 100% 100%  Weight:      Height:       General: alert, cooperative, and no distress Lochia: appropriate Uterine Fundus: firm Incision: Dressing is clean, dry, and intact DVT Evaluation: No evidence of DVT seen on physical exam. Labs: Lab Results  Component Value Date   WBC 21.4 (H) 12/03/2020   HGB 11.4 (L) 12/03/2020   HCT 34.2 (L) 12/03/2020   MCV 93.7 12/03/2020   PLT 294 12/03/2020   CMP Latest Ref Rng & Units 12/02/2020  Glucose 70 - 99 mg/dL 126(H)  BUN 6 - 20 mg/dL <5(L)  Creatinine  0.44 - 1.00 mg/dL 0.75  Sodium 135 - 145 mmol/L 134(L)  Potassium 3.5 - 5.1 mmol/L 3.8  Chloride 98 - 111 mmol/L 105  CO2 22 - 32 mmol/L 22  Calcium 8.9 - 10.3 mg/dL 8.5(L)  Total Protein 6.5 - 8.1 g/dL 5.8(L)  Total Bilirubin 0.3 - 1.2 mg/dL 0.6  Alkaline Phos 38 - 126 U/L 95  AST 15 - 41 U/L 15  ALT 0 - 44 U/L 12   Edinburgh Score: Edinburgh Postnatal Depression Scale Screening Tool 12/04/2020  I have been able to laugh and see the funny side of things. 0  I have looked forward with enjoyment to things. 0  I have blamed myself unnecessarily when things went wrong. 2  I have been anxious or worried for no good reason. 2  I have felt scared or panicky for no good reason. 0  Things have been getting on top of me. 2  I have been so unhappy that  I have had difficulty sleeping. 0  I have felt sad or miserable. 1  I have been so unhappy that I have been crying. 1  The thought of harming myself has occurred to me. 0  Edinburgh Postnatal Depression Scale Total 8     After visit meds:  Allergies as of 12/05/2020       Reactions   Nickel Rash        Medication List     STOP taking these medications    Accu-Chek Guide test strip Generic drug: glucose blood   Accu-Chek Guide w/Device Kit   Accu-Chek Softclix Lancets lancets   aspirin EC 81 MG tablet   Blood Pressure Monitor Automat Devi   cyclobenzaprine 10 MG tablet Commonly known as: FLEXERIL   glyBURIDE 2.5 MG tablet Commonly known as: DIABETA   Gojji Weight Scale Misc   loperamide 2 MG capsule Commonly known as: IMODIUM   metFORMIN 500 MG tablet Commonly known as: Glucophage   terconazole 0.4 % vaginal cream Commonly known as: TERAZOL 7       TAKE these medications    acetaminophen 500 MG tablet Commonly known as: TYLENOL Take 2 tablets (1,000 mg total) by mouth every 6 (six) hours as needed for mild pain (temperature > 101.5.).   albuterol 108 (90 Base) MCG/ACT inhaler Commonly known as: VENTOLIN HFA Inhale 1-2 puffs into the lungs every 6 (six) hours as needed for wheezing or shortness of breath.   coconut oil Oil Apply 1 application topically as needed.   Concept OB 130-92.4-1 MG Caps Take 1 capsule by mouth daily.   ibuprofen 600 MG tablet Commonly known as: ADVIL Take 1 tablet (600 mg total) by mouth every 6 (six) hours as needed for moderate pain, headache or cramping.   NIFEdipine 30 MG 24 hr tablet Commonly known as: ADALAT CC Take 1 tablet (30 mg total) by mouth daily.   oxyCODONE 5 MG immediate release tablet Commonly known as: Oxy IR/ROXICODONE Take 1-2 tablets (5-10 mg total) by mouth every 4 (four) hours as needed for moderate pain.         Discharge home in stable condition Infant Feeding: Breast Infant  Disposition:home with mother Discharge instruction: per After Visit Summary and Postpartum booklet. Activity: Advance as tolerated. Pelvic rest for 6 weeks.  Diet: routine diet Future Appointments: Future Appointments  Date Time Provider [place]  12/11/2020  8:50 AM [place] None  01/16/2021  2:10 PM Laury Deep, CNM [facility] None   Follow up Visit:  Message sent to Ren 12/02/20 by Sylvester Harder.   Please schedule this patient for a In person postpartum visit in 6 weeks with the following provider: Any provider. Additional Postpartum F/U:2 hour GTT, Incision check 1 week, and BP check 1 week  High risk pregnancy complicated by: GDM and HTN Delivery mode:  C-Section, Low Transverse  Anticipated Birth Control:  Depo  --gonrrhea: TOC PP --gHTN: started on nifedipine 1 wk PP    [id number] Arrie Senate, MD

## 2020-12-02 NOTE — Progress Notes (Addendum)
Patient ID: Bethany Avery, female   DOB: 2000-06-03, 20 y.o.   MRN: [id number]  Has continued to be placed into different positions to facilitate fetal rotation  BP 115/75, afebrile FHR 130s, +accels, no decels, +variables Ctx q 2-3 with Pit 32mu/min, MVUs inadequate ~ 120-150s Cx unchanged (5/80-90/vtx -1)  CBG 91  IUP@37 .5wks IOL process- not in labor yet GDMA2 gHTN- stable  -Dicussed giving a Pit break x 2h and then restarting with high dose protocol @ 0600- pt agreeable -Guarded for vag del  Arabella Merles [facility] 12/02/2020 4:09 AM

## 2020-12-02 NOTE — Lactation Note (Signed)
This note was copied from a baby's chart. Lactation Consultation Note  Patient Name: Boy Zenobia Kuennen TKPTW'S Date: 12/02/2020 Reason for consult: Initial assessment;Primapara;1st time breastfeeding;Early term 37-38.6wks Age:20 years old  Initial visit to 8 hours old infant of a P1 mother. Mother requests assistance to latch. Infant just had a stool. Infant seems sleepy and uninterested. LC demonstrated hand expression and spoonfed ~5 mL of EBM. Mother able to teach back HE. Infant keeps tongue up, no sucking reflex noted when finger-feeding. Mother is doing skin-to-skin upon LC leaving room.  Provided hand pump per mother's request.    Plan: 1-Skin to skin 2-Aim for a deep, comfortable latch 3-Breastfeeding on demand or 8-12 times in 24h period. 4-Keep infant awake during breastfeeding session: massaging breast, infant's hand/shoulder/feet 5-Monitor voids and stools as signs good intake.  6-Encouraged maternal rest, hydration and food intake.  7-Contact LC as needed for feeds/support/concerns/questions   All questions answered at this time. Provided Lactation services brochure and promoted [employer] booklet information.    Maternal Data Has patient been taught Hand Expression?: Yes Does the patient have breastfeeding experience prior to this delivery?: No  Feeding Mother's Current Feeding Choice: Breast Milk and Formula  Lactation Tools Discussed/Used Tools: Pump Breast pump type: Manual Pump Education: Milk Storage;Setup, frequency, and cleaning Reason for Pumping: mother's request Pumping frequency: as needed  Interventions Interventions: Breast feeding basics reviewed;Skin to skin;Breast massage;Hand express;Hand pump;Expressed milk;Education  Discharge Pump: Manual;Personal  Consult Status Consult Status: Follow-up Date: 12/03/20 Follow-up type: In-patient    Aayat Hajjar A Higuera Ancidey 12/02/2020, 9:53 PM

## 2020-12-02 NOTE — Progress Notes (Signed)
Patient at this time no longer needs IV team, Staff was able to obtain IV access.

## 2020-12-02 NOTE — Progress Notes (Signed)
Patient ID: Paul Torpey, female   DOB: 03-Mar-2001, 20 y.o.   MRN: [id number]  Mostly comfortable w epidural; has been in lat Sims and flying cowgirl positions  BPs 102/55, 102/59, P 75 FHR 130-140s, +accels, no decels Ctx reg at times q 2-3 mins; occ coupling/spacing; Pit at 22mu/min Cx 5/80-90/vtx -2  CBGs 103, 107  IUP@37 .5wks IOL process GDMA2 New gHTN  -Spent ~27mins doing side-lying release right and left sides; was comfortable in R lat Sims at the end so she is doing that currently -Pharmacist, hospital at 59mu/min -Recheck cx in 4hrs or sooner prn  Arabella Merles CNM 12/02/2020

## 2020-12-02 NOTE — Op Note (Signed)
Operative Note   Patient: Bethany Avery  Date of Procedure: 11/30/2020 - 12/02/2020  Procedure: Primary Low Transverse Cesarean   Indications: failure to progress: arrest of dilation at 5 cm  Pre-operative Diagnosis: arrest of dilation, cesarean section.   Post-operative Diagnosis: Same  TOLAC Candidate: Yes   Surgeon: Surgeon(s) and Role:    * Eckstat, Mary Sella, MD - Primary    * Alric Seton, MD - Assisting  Assistants: none  An experienced assistant was required given the standard of surgical care given the complexity of the case.  This assistant was needed for exposure, dissection, suctioning, retraction, instrument exchange, assisting with delivery with administration of fundal pressure, and for overall help during the procedure.   Anesthesia: epidural  Anesthesiologist: No responsible provider has been recorded for the case.   Antibiotics: Cefazolin and Azithromycin   Estimated Blood Loss: 402 ml   Total IV Fluids: 800 ml  Urine Output:  125 cc OF clear urine  Specimens: placenta to pathology   Complications: no complications   Indications: Frankie Zito is a 20 y.o. [phone] with an IUP [redacted]w[redacted]d presenting for unscheduled, urgent cesarean secondary to the indications listed above. Clinical course notable for failure to progress despite pitocin and rupture of membranes >24 hours.  Findings: Viable infant in cephalic presentation, no nuchal cord present. Apgars 8 , 9 , . Weight 3255 g . Clear amniotic fluid. Normal placenta, three vessel cord. Normal uterus, Normal bilateral fallopian tubes, Normal bilateral ovaries.  Procedure Details: A Time Out was held and the above information confirmed. The patient received intravenous antibiotics and had sequential compression devices applied to her lower extremities preoperatively. The patient was taken back to the operative suite where epidural anesthesia was administered. After induction of anesthesia,  the patient was draped and prepped in the usual sterile manner and placed in a dorsal supine position with a leftward tilt. A low transverse skin incision was made with scalpel and carried down through the subcutaneous tissue to the fascia. Fascial incision was made and extended transversely. The fascia was separated from the underlying rectus tissue superiorly and inferiorly. The rectus muscles were separated in the midline bluntly and sharply and the peritoneum was entered bluntly. An Alexis retractor was placed to aid in visualization of the uterus. A bladder flap was not developed. A low transverse uterine incision was made. The infant was successfully delivered from cephalic presentation, the umbilical cord was clamped after 1 minute. Cord ph was not sent, and cord blood was obtained for evaluation. The placenta was removed Intact and appeared normal. The uterine incision was closed with running locked sutures of 0-Monocryl, and then a second imbricating layer was also placed with 0-Monocryl. Due to ongoing bleeding from the hysterotomy figure of eight sutures of 0 Monocryl were placed after which there was excellent hemostasis.. The abdomen and the pelvis were cleared of all clot and debris and the Jon Gills was removed. Hemostasis was confirmed on all surfaces.  The peritoneum was reapproximated using 2-0 vicryl . The fascia was then closed using 0 Vicryl in a running fashion. The subcutaneous layer was reapproximated with 0 vicryl  and the skin was closed with a 4-0 vicryl subcuticular stitch. The patient tolerated the procedure well. Sponge, lap, instrument and needle counts were correct x 2. She was taken to the recovery room in stable condition.  Disposition: PACU - hemodynamically stable.    Signed: Alric Seton, MD

## 2020-12-03 ENCOUNTER — Encounter (HOSPITAL_COMMUNITY): Payer: Self-pay | Admitting: Family Medicine

## 2020-12-03 LAB — CBC
HCT: 34.2 % — ABNORMAL LOW (ref 36.0–46.0)
Hemoglobin: 11.4 g/dL — ABNORMAL LOW (ref 12.0–15.0)
MCH: 31.2 pg (ref 26.0–34.0)
MCHC: 33.3 g/dL (ref 30.0–36.0)
MCV: 93.7 fL (ref 80.0–100.0)
Platelets: 294 10*3/uL (ref 150–400)
RBC: 3.65 MIL/uL — ABNORMAL LOW (ref 3.87–5.11)
RDW: 12.4 % (ref 11.5–15.5)
WBC: 21.4 10*3/uL — ABNORMAL HIGH (ref 4.0–10.5)
nRBC: 0 % (ref 0.0–0.2)

## 2020-12-03 LAB — GLUCOSE, CAPILLARY
Glucose-Capillary: 108 mg/dL — ABNORMAL HIGH (ref 70–99)
Glucose-Capillary: 69 mg/dL — ABNORMAL LOW (ref 70–99)

## 2020-12-03 MED ORDER — IBUPROFEN 600 MG PO TABS
600.0000 mg | ORAL_TABLET | Freq: Four times a day (QID) | ORAL | Status: DC | PRN
  Administered 2020-12-03 – 2020-12-05 (×8): 600 mg via ORAL
  Filled 2020-12-03 (×8): qty 1

## 2020-12-03 MED ORDER — ACETAMINOPHEN 500 MG PO TABS
1000.0000 mg | ORAL_TABLET | Freq: Four times a day (QID) | ORAL | Status: DC | PRN
  Administered 2020-12-03 – 2020-12-05 (×7): 1000 mg via ORAL
  Filled 2020-12-03 (×7): qty 2

## 2020-12-03 MED ORDER — MEDROXYPROGESTERONE ACETATE 150 MG/ML IM SUSP
150.0000 mg | Freq: Once | INTRAMUSCULAR | Status: AC
  Administered 2020-12-05: 150 mg via INTRAMUSCULAR
  Filled 2020-12-03: qty 1

## 2020-12-03 NOTE — Progress Notes (Addendum)
POSTPARTUM PROGRESS NOTE  POD #1  Subjective:  Bethany Avery is a 20 y.o. G2P1011 s/p LTCS at [redacted]w[redacted]d.  She reports she doing well. No acute events overnight. She denies any problems with ambulating, voiding or po intake. Denies nausea or vomiting. She has  NOT passed flatus (active bowel sounds, will let us know if she doesn't today). Pain is well controlled.  Lochia is appropriate.  Objective: Blood pressure 132/81, pulse 77, temperature 98 F (36.7 C), temperature source Oral, resp. rate 18, height 5\' 5"  (1.651 m), weight 104.8 kg, last menstrual period 03/07/2020, SpO2 98 %, unknown if currently breastfeeding.  Physical Exam:  General: alert, cooperative and no distress Chest: no respiratory distress Heart:regular rate, Abdomen: soft, non distended Uterine Fundus: firm, appropriately tender DVT Evaluation: No calf swelling or tenderness Extremities: no edema Skin: warm, dry; incision clean/dry/intact w/  dressing in place  Recent Labs    12/02/20 1001 12/03/20 0455  HGB 12.1 11.4*  HCT 35.1* 34.2*    Assessment/Plan: Bethany Avery is a 20 y.o. G2P1011 s/p pLTCS at [redacted]w[redacted]d for FTP.  POD#1 - Doing welll; pain well controlled. H/H appropriate  Routine postpartum care  OOB, ambulated  Lovenox for VTE prophylaxis  Desires circ for baby, consented Anemia: asymptomatic  Hgb 11.4  A2GDM: FBGL 108, plan for 2 hr gtt at postpartum visit  gHTN: diagnosed on admit, nml labs, BP stable. No meds currently. Continue to monitor.  Gonorrhea: treated 7/6, needs TOC at 6 week visit  Anx/depression: SW consulted, no meds  THC use in pregnancy  Contraception: Depo Feeding: breast  Dispo: Plan for discharge 7/13.   LOS: 3 days   8/13, MD PGY3 12/03/2020, 7:30 AM  GME ATTESTATION:  I saw and evaluated the patient. I agree with the findings and the plan of care as documented in the resident's note.  02/03/2021, MD OB Fellow,  Faculty [facility], [facility] 12/03/2020 2:43 PM

## 2020-12-03 NOTE — Lactation Note (Addendum)
This note was copied from a baby's chart. Lactation Consultation Note  Patient Name: Bethany Avery UDJSH'F Date: 12/03/2020   Age:20 hours P1, 26 hour ETI female infant. LC entered the room, mom was having LSW consult and informed mom, LC services will return later today to discuss breastfeeding.  Maternal Data    Feeding    LATCH Score                    Lactation Tools Discussed/Used    Interventions    Discharge    Consult Status      Danelle Earthly 12/03/2020, 3:41 PM

## 2020-12-03 NOTE — Lactation Note (Signed)
This note was copied from a baby's chart. Lactation Consultation Note  Patient Name: Bethany Avery ZHYQM'V Date: 12/03/2020 Reason for consult: Follow-up assessment;1st time breastfeeding;Early term 37-38.6wks Age:20 hours LC ask mom to do breast stimulation prior to latching infant on her right breast, hand express small amount of colostrum out prior to latching infant. Mom latched infant on her right breast using the football hold, bring infant toward chest, wait until tongue is extended downward and mouth is open wide, infant latched with depth, audible swallows were observed, infant sustained latch and BF for 10 minutes. LC reviewed  hand expression and mom expressed 7 mls of colostrum that was spoon feed to infant. Afterwards mom was doing STS with infant. Mom will continue to BF infant according to hunger cues, 8 to 12+ or more times with STS. Mom knows to call RN or LC if she needs further assistance with latching infant at the breast.  Mom will work on hand postioning and keeping infant nose close to breast but nostril free. Maternal Data    Feeding Mother's Current Feeding Choice: Breast Milk and Formula  LATCH Score Latch: Grasps breast easily, tongue down, lips flanged, rhythmical sucking.  Audible Swallowing: Spontaneous and intermittent  Type of Nipple: Everted at rest and after stimulation  Comfort (Breast/Nipple): Soft / non-tender  Hold (Positioning): Assistance needed to correctly position infant at breast and maintain latch.  LATCH Score: 9   Lactation Tools Discussed/Used Tools: Pump Breast pump type: Manual Reason for Pumping: Mom is pre-pumping to help extend nipple shaft out more due to being short shafted. Pumping frequency: pre-pumping purposes with hand pump.  Interventions Interventions: Assisted with latch;Pre-pump if needed;Breast compression;Adjust position;Support pillows;Position options;Expressed milk;Hand pump;Education  Discharge     Consult Status Consult Status: Follow-up Date: 12/04/20 Follow-up type: In-patient    Danelle Earthly 12/03/2020, 5:06 PM
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
## 2020-12-04 LAB — SURGICAL PATHOLOGY

## 2020-12-04 MED ORDER — NIFEDIPINE ER OSMOTIC RELEASE 30 MG PO TB24
30.0000 mg | ORAL_TABLET | Freq: Every day | ORAL | Status: DC
  Administered 2020-12-04 – 2020-12-05 (×2): 30 mg via ORAL
  Filled 2020-12-04 (×2): qty 1

## 2020-12-04 MED ORDER — POLYETHYLENE GLYCOL 3350 17 G PO PACK: 17.0000 g | PACK | Freq: Every day | ORAL | Status: DC | PRN

## 2020-12-04 MED ORDER — DOCUSATE SODIUM 100 MG PO CAPS: 100.0000 mg | ORAL_CAPSULE | Freq: Two times a day (BID) | ORAL | Status: DC | PRN

## 2020-12-04 MED ORDER — POLYETHYLENE GLYCOL 3350 17 G PO PACK
17.0000 g | PACK | Freq: Every day | ORAL | Status: DC | PRN
  Administered 2020-12-04: 17 g via ORAL
  Filled 2020-12-04: qty 1

## 2020-12-04 NOTE — Progress Notes (Signed)
I spoke with patient regarding question #10 on the Edinburgh scale. Patient stated she hasn't had thoughts of self harm in the past year and she doesn't have any currently.   Cheri Guppy RN

## 2020-12-04 NOTE — Progress Notes (Signed)
POSTPARTUM PROGRESS NOTE  POD #2  Subjective:  Bethany Avery is a 20 y.o. G2P1011 s/p LTCS at [redacted]w[redacted]d.  She reports she doing well. No acute events overnight. She denies any problems with ambulating, voiding or po intake. Denies nausea or vomiting. She has passed flatus, no Bms yet. Pain is well controlled.  Lochia is appropriate. NO HA, vision changes, to RUQ pain, no calf pain/swelling   Objective: Blood pressure (!) 153/98, pulse 93, temperature 98 F (36.7 C), temperature source Oral, resp. rate 16, height 5\' 5"  (1.651 m), weight 104.8 kg, last menstrual period 03/07/2020, SpO2 100 %, unknown if currently breastfeeding.  Physical Exam:  General: alert, cooperative and no distress Chest: no respiratory distress Heart:regular rate, Abdomen: soft, non distended Uterine Fundus: firm, appropriately tender DVT Evaluation: No calf swelling or tenderness Extremities: no edema Skin: warm, dry; incision clean/dry/intact w/  dressing in place  Recent Labs    12/02/20 1001 12/03/20 0455  HGB 12.1 11.4*  HCT 35.1* 34.2*    Assessment/Plan: Bethany Avery is a 20 y.o. G2P1011 s/p pLTCS at [redacted]w[redacted]d for FTP.  --POD#2 - Doing welll; pain well controlled. H/H appropriate  Routine postpartum care  OOB, ambulated  Lovenox for VTE prophylaxis  Desires circ for baby, consented, defered per peds  Anemia: asymptomatic  Hgb 11.4  --Constipation: bowel regimen today   --A2GDM: FBGL 108, plan for 2 hr gtt at postpartum visit  --gHTN: diagnosed on admit, nml labs. Elevated Bps, start nifedipine 30mg , 1 wk f/u PP  --Gonorrhea: treated 7/6, needs TOC at 6 week visit  --Anx/depression: SW consulted, no meds  --THC use in pregnancy  Contraception: Depo Feeding: breast  Dispo: Plan for discharge 7/14 (baby not cleared yet)   LOS: 4 days   9/6, MD PGY3 12/04/2020, 7:27 AM

## 2020-12-05 ENCOUNTER — Encounter: Payer: Medicaid Other | Admitting: Physical Therapy

## 2020-12-05 ENCOUNTER — Other Ambulatory Visit (HOSPITAL_COMMUNITY): Payer: Self-pay

## 2020-12-05 ENCOUNTER — Other Ambulatory Visit: Payer: Medicaid Other

## 2020-12-05 MED ORDER — COCONUT OIL OIL: 1.0000 "application " | TOPICAL_OIL | 0 refills | Status: DC | PRN

## 2020-12-05 MED ORDER — NIFEDIPINE ER 30 MG PO TB24
30.0000 mg | ORAL_TABLET | Freq: Every day | ORAL | 0 refills | Status: DC
  Filled 2020-12-05: qty 90, 90d supply, fill #0

## 2020-12-05 MED ORDER — IBUPROFEN 600 MG PO TABS: 600.0000 mg | ORAL_TABLET | Freq: Four times a day (QID) | ORAL | 0 refills | Status: DC | PRN

## 2020-12-05 MED ORDER — OXYCODONE HCL 5 MG PO TABS
5.0000 mg | ORAL_TABLET | ORAL | 0 refills | Status: DC | PRN
  Filled 2020-12-05: qty 15, 2d supply, fill #0

## 2020-12-05 MED ORDER — ACETAMINOPHEN 500 MG PO TABS: 1000.0000 mg | ORAL_TABLET | Freq: Four times a day (QID) | ORAL | 0 refills | Status: DC | PRN

## 2020-12-05 NOTE — Social Work (Signed)
CSW received consult for [place] 9/answered 1 on question 10.  CSW met with [name] to offer support and complete assessment.     CSW checked in with [name] at bedside. [name] welcoming of CSW. [name] presented smiling and happy. CSW informed [name] the reason for visit, to discuss [place]. CSW inquired if [name] was having thoughts of harming self. [name] disclosed, " I had thoughts last year, but I am not having thoughts right now." CSW inquired if [name] has had thoughts in the past seven days. [name] disclosed, no, " I need to redo that [place] because I did not answer the questions based on the last 7 days." CSW encouraged [name] to use the resources that were given to her on 7/12 and reach out to a medical provider if concerns arise. [name] reports she feel comfortable reaching out to her physician if concerns arise.   CSW identifies no further need for intervention and no barriers to discharge at this time.  Kathrin Greathouse, MSW, LCSW [facility] [place] [facility]  [phone] 12/05/2020  10:53 AM

## 2020-12-05 NOTE — Lactation Note (Signed)
This note was copied from a baby's chart. Lactation Consultation Note  Patient Name: Boy Haniyah Maciolek [id number]'V Date: 12/05/2020 Reason for consult: Follow-up assessment;Early term 21-38.6wks Age:20 hours   P1 mother whose infant is now 52 hours old.  This is an ETI at 37+5 weeks.  Mother's feeding preference is breast/formula.  Mother reported that she is formula feeding at this time.  She wants to continue working on breast feeding at home and is pumping with the DEBP.  Mother had no questions/concerns related to breast feeding or pumping.  Emphasized the importance of continuing to pump every three hours, especially if she is not latching at this time.  Discussed milk "coming to volume".  Mother verbalized understanding. Baby has had multiple voids/stools.  She has a DEBP for home use. Support person present.  Mother also has our OP phone number for any further questions.   Maternal Data    Feeding Mother's Current Feeding Choice: Breast Milk and Formula  LATCH Score                    Lactation Tools Discussed/Used    Interventions    Discharge Discharge Education: Engorgement and breast care  Consult Status Consult Status: Complete Date: 12/05/20 Follow-up type: Call as needed    Irene Pap Payson Evrard 12/05/2020, 12:34 PM

## 2020-12-06 ENCOUNTER — Encounter: Payer: Medicaid Other | Admitting: Certified Nurse Midwife

## 2020-12-11 ENCOUNTER — Other Ambulatory Visit: Payer: Self-pay

## 2020-12-11 ENCOUNTER — Ambulatory Visit (INDEPENDENT_AMBULATORY_CARE_PROVIDER_SITE_OTHER): Payer: Medicaid Other | Admitting: *Deleted

## 2020-12-11 VITALS — BP 127/68 | HR 110 | Temp 98.2°F | Ht 65.0 in | Wt 215.4 lb

## 2020-12-11 DIAGNOSIS — Z4889 Encounter for other specified surgical aftercare: Secondary | ICD-10-CM

## 2020-12-11 DIAGNOSIS — Z0130 Encounter for examination of blood pressure without abnormal findings: Secondary | ICD-10-CM

## 2020-12-11 NOTE — Progress Notes (Signed)
   Subjective:     Bethany Avery is a 20 y.o. female who presents to the clinic 1 weeks status post  c-section  for  delivery of infant . Eating a regular diet without difficulty. Bowel movements are normal. Pain is controlled with current analgesics. Medications being used: prescription NSAID's including ibuprofen (Motrin) and narcotic analgesics including oxycodone.  Patient is also in clinic for BP check. Hypertension ROS: taking medications as instructed, no medication side effects noted, no TIA's, no chest pain on exertion, no dyspnea on exertion, no swelling of ankles, and no palpitations.   The following portions of the patient's history were reviewed and updated as appropriate: allergies, current medications, past family history, past medical history, past social history, past surgical history, and problem list.  Review of Systems Pertinent items are noted in HPI.    Objective:    BP 127/68 (BP Location: Left Arm, Patient Position: Sitting, Cuff Size: Large)   Pulse (!) 110   Temp 98.2 F (36.8 C) (Oral)   Ht 5\' 5"  (1.651 m)   Wt 215 lb 6.4 oz (97.7 kg)   LMP 03/07/2020   Breastfeeding Yes   BMI 35.84 kg/m  General:  alert, cooperative, and appears stated age  Abdomen: soft, non-tender  Incision:   healing well, no drainage, no erythema, no hernia, no seroma, no swelling, no dehiscence, incision well approximated     Assessment:    Doing well postoperatively.  Blood pressure well controlled, stable, improved, and asymptomatic Plan:    1. Continue any current medications. 2. Wound care discussed. 3. Activity restrictions: no lifting more than 25 pounds 4. Anticipated return to work: not applicable. 5. Follow up: 5 weeks for Postpartum.  6. 2 hour gtt scheduled for 12/16/20; patient did not fast for appointment today.  12/18/20, RN

## 2020-12-12 ENCOUNTER — Encounter: Payer: Medicaid Other | Admitting: Obstetrics and Gynecology

## 2020-12-14 ENCOUNTER — Telehealth (HOSPITAL_COMMUNITY): Payer: Self-pay

## 2020-12-14 NOTE — Telephone Encounter (Signed)
No answer. Mailbox is full and cannot except any messages.  Marcelino Duster [facility] 12/14/2020,1001

## 2020-12-16 ENCOUNTER — Other Ambulatory Visit: Payer: Self-pay

## 2020-12-16 ENCOUNTER — Other Ambulatory Visit (INDEPENDENT_AMBULATORY_CARE_PROVIDER_SITE_OTHER): Payer: Medicaid Other | Admitting: *Deleted

## 2020-12-16 DIAGNOSIS — O24419 Gestational diabetes mellitus in pregnancy, unspecified control: Secondary | ICD-10-CM

## 2020-12-16 NOTE — Progress Notes (Signed)
    Patient is in clinic for 2 hour gtt postpartum.  Clovis Pu, RN

## 2020-12-17 LAB — GLUCOSE TOLERANCE, 2 HOURS
Glucose, 2 hour: 131 mg/dL (ref 65–139)
Glucose, GTT - Fasting: 114 mg/dL — ABNORMAL HIGH (ref 65–99)

## 2020-12-19 ENCOUNTER — Encounter: Payer: Medicaid Other | Admitting: Obstetrics and Gynecology

## 2020-12-20 ENCOUNTER — Other Ambulatory Visit: Payer: Medicaid Other

## 2020-12-23 ENCOUNTER — Other Ambulatory Visit: Payer: Self-pay | Admitting: *Deleted

## 2020-12-23 DIAGNOSIS — O24419 Gestational diabetes mellitus in pregnancy, unspecified control: Secondary | ICD-10-CM

## 2020-12-23 NOTE — Progress Notes (Signed)
Future order for 2 hour gtt for postpartum retesting. Patient did not fast at the last visit.   Clovis Pu, RN

## 2020-12-24 ENCOUNTER — Other Ambulatory Visit: Payer: Self-pay

## 2020-12-24 ENCOUNTER — Other Ambulatory Visit: Payer: Medicaid Other

## 2020-12-24 DIAGNOSIS — O24419 Gestational diabetes mellitus in pregnancy, unspecified control: Secondary | ICD-10-CM

## 2020-12-25 LAB — GLUCOSE TOLERANCE, 2 HOURS
Glucose, 2 hour: 118 mg/dL (ref 65–139)
Glucose, GTT - Fasting: 95 mg/dL (ref 65–99)

## 2021-01-01 ENCOUNTER — Ambulatory Visit: Payer: Medicaid Other | Admitting: Certified Nurse Midwife

## 2021-01-16 ENCOUNTER — Ambulatory Visit (INDEPENDENT_AMBULATORY_CARE_PROVIDER_SITE_OTHER): Payer: Medicaid Other | Admitting: Obstetrics and Gynecology

## 2021-01-16 ENCOUNTER — Other Ambulatory Visit: Payer: Self-pay

## 2021-01-16 ENCOUNTER — Other Ambulatory Visit (HOSPITAL_COMMUNITY)
Admission: RE | Admit: 2021-01-16 | Discharge: 2021-01-16 | Disposition: A | Discharge status: Home / Self Care | Payer: Medicaid Other | Source: Ambulatory Visit | Attending: Certified Nurse Midwife | Admitting: Certified Nurse Midwife

## 2021-01-16 VITALS — BP 118/76 | HR 118 | Temp 97.7°F | Ht 65.0 in | Wt 226.8 lb

## 2021-01-16 DIAGNOSIS — Z3202 Encounter for pregnancy test, result negative: Secondary | ICD-10-CM

## 2021-01-16 DIAGNOSIS — Z7251 High risk heterosexual behavior: Secondary | ICD-10-CM

## 2021-01-16 DIAGNOSIS — Z113 Encounter for screening for infections with a predominantly sexual mode of transmission: Secondary | ICD-10-CM | POA: Diagnosis not present

## 2021-01-16 LAB — POCT URINE PREGNANCY: Preg Test, Ur: NEGATIVE

## 2021-01-16 NOTE — Progress Notes (Signed)
Post Partum Visit Note  Bethany Avery is a 20 y.o. G35P1011 female who presents for a postpartum visit. She is 6 weeks postpartum following a primary cesarean section.  I have fully reviewed the prenatal and intrapartum course. The delivery was at [redacted]w[redacted]d gestational weeks.  Anesthesia: epidural. Postpartum course has been complicated with HTN taking Nifedipine 30 mg daily. Baby is doing well. Baby is feeding by bottle - Similac Advance. Bleeding no bleeding. Bowel function is normal. Bladder function is normal. Patient is sexually active. Contraception method is Depo-Provera injections. Postpartum depression screening: positive, score 8.   The pregnancy intention screening data noted above was reviewed. Potential methods of contraception were discussed. The patient elected to proceed with No data recorded.   Edinburgh Postnatal Depression Scale - 01/16/21 1417       Edinburgh Postnatal Depression Scale:  In the Past 7 Days   I have been able to laugh and see the funny side of things. 0    I have looked forward with enjoyment to things. 1    I have blamed myself unnecessarily when things went wrong. 2    I have been anxious or worried for no good reason. 0    I have felt scared or panicky for no good reason. 0    Things have been getting on top of me. 2    I have been so unhappy that I have had difficulty sleeping. 0    I have felt sad or miserable. 2    I have been so unhappy that I have been crying. 1    The thought of harming myself has occurred to me. 0    Edinburgh Postnatal Depression Scale Total 8             Health Maintenance Due  Topic Date Due   COVID-19 Vaccine (1) Never done   URINE MICROALBUMIN  Never done   HPV VACCINES (1 - 2-dose series) Never done   INFLUENZA VACCINE  12/23/2020    The following portions of the patient's history were reviewed and updated as appropriate: allergies, current medications, past family history, past medical history, past  social history, past surgical history, and problem list.  Review of Systems Constitutional: negative Eyes: negative Ears, nose, mouth, throat, and face: negative Respiratory: negative Cardiovascular: negative Gastrointestinal: negative Genitourinary:negative Integument/breast: negative Hematologic/lymphatic: negative Musculoskeletal:negative Neurological: negative Behavioral/Psych: negative Endocrine: negative Allergic/Immunologic: negative  Objective:  BP 118/76 (BP Location: Left Arm, Patient Position: Sitting, Cuff Size: Large)   Pulse (!) 118   Temp 97.7 F (36.5 C) (Oral)   Ht 5\' 5"  (1.651 m)   Wt 226 lb 12.8 oz (102.9 kg)   LMP 03/07/2020   BMI 37.74 kg/m    General:  alert, cooperative, and no distress   Breasts:  normal  Lungs: clear to auscultation bilaterally  Heart:  regular rate and rhythm, S1, S2 normal, no murmur, click, rub or gallop  Abdomen: soft, non-tender; bowel sounds normal; no masses,  no organomegaly   Wound well approximated incision  GU exam:  not indicated       Assessment:   Screen for STD (sexually transmitted disease)  - TOC for (+) GC  - Urine cytology ancillary only([place])  Unprotected sexual intercourse  - POCT urine pregnancy>>Negative  Encounter for postpartum visit  Normal postpartum exam.   Plan:   Essential components of care per ACOG recommendations:  1.  Mood and well being: Patient with negative depression screening today. Reviewed  local resources for support.  - Patient tobacco use? No.   - hx of drug use? No.    2. Infant care and feeding:  -Patient currently breastmilk feeding? No.  -Social determinants of health (SDOH) reviewed in EPIC. No concerns  3. Sexuality, contraception and birth spacing - Patient does not want a pregnancy in the next year.  Desired family size is 1 children.  - Reviewed forms of contraception in tiered fashion. Patient desired Depo-Provera today.   - Discussed birth spacing of  18 months  4. Sleep and fatigue -Encouraged family/partner/community support of 4 hrs of uninterrupted sleep to help with mood and fatigue  5. Physical Recovery  - Discussed patients delivery and complications. She describes her labor as mixed. - Patient had a C-section failure to progress. Postoperative healing reviewed. Patient expressed understanding - Patient has urinary incontinence? No. - Patient is safe to resume physical and sexual activity  6.  Health Maintenance - HM due items addressed Yes - Last pap smear No results found for: DIAGPAP Pap smear not done at today's visit d/t age  -Breast Cancer screening indicated? No.   7. Chronic Disease/Pregnancy Condition follow up: Hypertension  - PCP follow up for HTN  Raelyn Mora, CNM [facility] [employer], [facility]

## 2021-01-21 ENCOUNTER — Encounter: Payer: Self-pay | Admitting: Obstetrics and Gynecology

## 2021-01-22 LAB — URINE CYTOLOGY ANCILLARY ONLY
Chlamydia: POSITIVE — AB
Comment: NEGATIVE
Comment: NEGATIVE
Comment: NORMAL
Neisseria Gonorrhea: POSITIVE — AB
Trichomonas: NEGATIVE

## 2021-01-23 ENCOUNTER — Ambulatory Visit (HOSPITAL_COMMUNITY)
Admission: EM | Admit: 2021-01-23 | Discharge: 2021-01-23 | Disposition: A | Discharge status: Home / Self Care | Payer: Medicaid Other | Attending: Physician Assistant | Admitting: Physician Assistant

## 2021-01-23 ENCOUNTER — Encounter (HOSPITAL_COMMUNITY): Payer: Self-pay

## 2021-01-23 ENCOUNTER — Other Ambulatory Visit: Payer: Self-pay

## 2021-01-23 ENCOUNTER — Other Ambulatory Visit: Payer: Self-pay | Admitting: *Deleted

## 2021-01-23 ENCOUNTER — Ambulatory Visit (HOSPITAL_BASED_OUTPATIENT_CLINIC_OR_DEPARTMENT_OTHER)
Admit: 2021-01-23 | Discharge: 2021-01-23 | Disposition: A | Discharge status: Home / Self Care | Payer: Medicaid Other | Attending: Physician Assistant | Admitting: Physician Assistant

## 2021-01-23 DIAGNOSIS — M79661 Pain in right lower leg: Secondary | ICD-10-CM | POA: Diagnosis not present

## 2021-01-23 DIAGNOSIS — Z79899 Other long term (current) drug therapy: Secondary | ICD-10-CM | POA: Insufficient documentation

## 2021-01-23 DIAGNOSIS — J029 Acute pharyngitis, unspecified: Secondary | ICD-10-CM | POA: Diagnosis not present

## 2021-01-23 DIAGNOSIS — M79604 Pain in right leg: Secondary | ICD-10-CM

## 2021-01-23 DIAGNOSIS — A749 Chlamydial infection, unspecified: Secondary | ICD-10-CM

## 2021-01-23 DIAGNOSIS — R509 Fever, unspecified: Secondary | ICD-10-CM | POA: Diagnosis not present

## 2021-01-23 DIAGNOSIS — Z20822 Contact with and (suspected) exposure to covid-19: Secondary | ICD-10-CM | POA: Insufficient documentation

## 2021-01-23 LAB — SARS CORONAVIRUS 2 (TAT 6-24 HRS): SARS Coronavirus 2: NEGATIVE

## 2021-01-23 LAB — POCT RAPID STREP A, ED / UC: Streptococcus, Group A Screen (Direct): NEGATIVE

## 2021-01-23 LAB — POCT INFECTIOUS MONO SCREEN, ED / UC: Mono Screen: NEGATIVE

## 2021-01-23 MED ORDER — DOXYCYCLINE HYCLATE 100 MG PO CAPS: 100.0000 mg | ORAL_CAPSULE | Freq: Two times a day (BID) | ORAL | 0 refills | Status: DC

## 2021-01-23 NOTE — Progress Notes (Signed)
Lower extremity venous has been completed.   Preliminary results in CV Proc.   Bethany Avery 01/23/2021 2:25 PM

## 2021-01-23 NOTE — ED Triage Notes (Signed)
Pt in with c/o ST, tonsil inflammation and muscle aches in her legs for the past few days  Pt states she was recently around friends who were having throat pain, pt has not tried otc intervention

## 2021-01-23 NOTE — Progress Notes (Signed)
    S: Patient sent a Mychart message today for STD treatment of Chlamydia and gonorrhea.    O: Need for treatment of chlamydia and gonorrhea.  A: Doxycycline 100 mg 1 tab PO BID x 7 days sent to pharmacy. Patient to call clinic for an appointment for  gonorrhea.   P: Patient to follow up in 1 month for re-screening.    STD report form fax completed and faxed to [facility] Department at [phone] (STD department).     Patient advised to abstain from sex for 7-10 days after treatment or when partner has been tested/treated.    Clovis Pu, RN Good

## 2021-01-23 NOTE — Discharge Instructions (Addendum)
Your strep was negative.  We will contact you if your COVID is positive.  Please go get ultrasound as we discussed.  Use over-the-counter medications for symptom relief.  If anything worsens please go to the emergency room particularly if you develop chest pain, shortness of breath, leg swelling.

## 2021-01-23 NOTE — ED Provider Notes (Signed)
[facility]    CSN: [id number] Arrival date & time: 01/23/21  9629      History   Chief Complaint Chief Complaint  Patient presents with   Sore Throat   Headache   Generalized Body Aches    HPI Bethany Avery is a 20 y.o. female.   Patient presents today with a 4-day history of sore throat.  Reports her throat pain is rated 8 on a 0-10 pain scale, localized to posterior oropharynx, worse with swallowing, no alleviating factors identified.  She reports associated body aches and headache.  Denies any fever, cough, congestion, nausea, vomiting.  She has not tried any over-the-counter medications for symptom management.  Does report known sick contacts with similar symptoms.  Denies any recent antibiotic use.  She has had COVID-19 vaccination.  She is confident that she is not pregnant.   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   Gestational hypertension 07/03/2020   Headache in pregnancy, antepartum, second trimester 07/03/2020   Supervision of high risk pregnancy, antepartum 05/31/2020   History of depression 05/31/2020   Obesity in pregnancy, antepartum 05/31/2020   Adjustment disorder with mixed anxiety and depressed mood 04/02/2020   Marijuana user 06/22/2018   Gonorrhea 02/02/2018   Adolescent behavior problem    Attention deficit hyperactivity disorder 08/24/2013    Past Surgical History:  Procedure Laterality Date   CESAREAN SECTION N/A 12/02/2020    Procedure: CESAREAN SECTION;  Surgeon: Venora Maples, MD;  Location: [facility]S;  Service: Obstetrics;  Laterality: N/A;    OB History     Gravida  2   Para  1   Term  1   Preterm      AB  1   Living  1      SAB  1   IAB      Ectopic      Multiple  0   Live Births  1            Home Medications    Prior to Admission medications   Medication Sig Start Date End Date Taking? Authorizing Provider  acetaminophen (TYLENOL) 500 MG tablet Take 2 tablets (1,000 mg total) by mouth every 6 (six) hours as needed for mild pain (temperature > 101.5.). 12/05/20   Marylene Land, CNM  albuterol (VENTOLIN HFA) 108 (90 Base) MCG/ACT inhaler Inhale 1-2 puffs into the lungs every 6 (six) hours as needed for wheezing or shortness of breath. Patient not taking: Reported on 01/16/2021 01/14/20   Wallis Bamberg, PA-C  coconut oil OIL Apply 1 application topically as needed. Patient not taking: Reported on 01/16/2021 12/05/20   Marylene Land, CNM  doxycycline (VIBRAMYCIN) 100 MG capsule Take 1 capsule (100 mg total) by mouth 2 (two) times daily for 7 days. 01/23/21 01/30/21  Raelyn Mora, CNM  ibuprofen (ADVIL) 600 MG tablet Take 1 tablet (600 mg total) by mouth every 6 (six) hours as needed for moderate  pain, headache or cramping. Patient not taking: Reported on 01/16/2021 12/05/20   Marylene Land, CNM  NIFEdipine (ADALAT CC) 30 MG 24 hr tablet Take 1 tablet (30 mg total) by mouth daily. 12/05/20   Marylene Land, CNM  oxyCODONE (OXY IR/ROXICODONE) 5 MG immediate release tablet Take 1-2 tablets (5-10 mg total) by mouth every 4 (four) hours as needed for moderate pain. Patient not taking: Reported on 01/16/2021 12/05/20   Marylene Land, CNM  Prenat w/o A Vit-FeFum-FePo-FA (CONCEPT OB) 130-92.4-1 MG CAPS Take 1 capsule by mouth daily. 04/26/20   Leftwich-Kirby, Wilmer Floor, CNM  metoCLOPramide (REGLAN) 10 MG tablet Take 1 tablet (10 mg total) by  mouth every 8 (eight) hours as needed (take with tylenol for headaches). 07/03/20 12/01/20  Judeth Horn, NP    Family History Family History  Problem Relation Age of Onset   Healthy Mother    Healthy Father     Social History Social History   Tobacco Use   Smoking status: Never   Smokeless tobacco: Never  Vaping Use   Vaping Use: Never used  Substance Use Topics   Alcohol use: Not Currently   Drug use: Not Currently    Types: Marijuana    Comment: used this week     Allergies   Nickel   Review of Systems Review of Systems  Constitutional:  Negative for activity change, appetite change, fatigue and fever.  HENT:  Positive for sore throat. Negative for congestion, sinus pressure and sneezing.   Respiratory:  Negative for cough and shortness of breath.   Cardiovascular:  Negative for chest pain.  Gastrointestinal:  Negative for abdominal pain, diarrhea, nausea and vomiting.  Neurological:  Positive for headaches. Negative for dizziness and light-headedness.    Physical Exam Triage Vital Signs ED Triage Vitals  Enc Vitals Group     BP 01/23/21 1144 122/86     Pulse Rate 01/23/21 1144 99     Resp 01/23/21 1144 17     Temp 01/23/21 1144 99.5 F (37.5 C)     Temp Source 01/23/21 1144 Oral     SpO2 01/23/21 1144 96 %     Weight --      Height --      Head Circumference --      Peak Flow --      Pain Score 01/23/21 1142 8     Pain Loc --      Pain Edu? --      Excl. in GC? --    No data found.  Updated Vital Signs BP 122/86 (BP Location: Right Arm)   Pulse 99   Temp 99.5 F (37.5 C) (Oral)   Resp 17   LMP 03/07/2020   SpO2 96%   Breastfeeding No   Visual Acuity Right Eye Distance:   Left Eye Distance:   Bilateral Distance:    Right Eye Near:   Left Eye Near:    Bilateral Near:     Physical Exam Vitals reviewed.  Constitutional:      General: She is awake. She is not in acute distress.    Appearance: Normal appearance. She is normal weight.  She is not ill-appearing.     Comments: Very pleasant female appears at age in no acute distress  HENT:     Head: Normocephalic and atraumatic.     Right Ear: Tympanic membrane, ear canal and external ear normal. There is impacted cerumen. Tympanic membrane is not erythematous or bulging.  Left Ear: Tympanic membrane, ear canal and external ear normal. There is impacted cerumen. Tympanic membrane is not erythematous or bulging.     Nose:     Right Sinus: No maxillary sinus tenderness or frontal sinus tenderness.     Left Sinus: No maxillary sinus tenderness or frontal sinus tenderness.     Mouth/Throat:     Pharynx: Uvula midline. Posterior oropharyngeal erythema present. No oropharyngeal exudate.     Tonsils: No tonsillar exudate or tonsillar abscesses. 2+ on the right. 2+ on the left.  Cardiovascular:     Rate and Rhythm: Normal rate and regular rhythm.     Heart sounds: Normal heart sounds, S1 normal and S2 normal. No murmur heard. Pulmonary:     Effort: Pulmonary effort is normal.     Breath sounds: Normal breath sounds. No wheezing, rhonchi or rales.     Comments: Clear to auscultation bilaterally Musculoskeletal:     Comments: Positive Homan's sign on right  Lymphadenopathy:     Head:     Right side of head: No submental, submandibular or tonsillar adenopathy.     Left side of head: No submental, submandibular or tonsillar adenopathy.     Cervical: No cervical adenopathy.  Psychiatric:        Behavior: Behavior is cooperative.     UC Treatments / Results  Labs (all labs ordered are listed, but only abnormal results are displayed) Labs Reviewed  CULTURE, GROUP A STREP ([facility])  SARS CORONAVIRUS 2 (TAT 6-24 HRS)  POCT RAPID STREP A, ED / UC  POCT INFECTIOUS MONO SCREEN, ED / UC    EKG   Radiology No results found.  Procedures Procedures (including critical care time)  Medications Ordered in UC Medications - No data to display  Initial Impression / Assessment  and Plan / UC Course  I have reviewed the triage vital signs and the nursing notes.  Pertinent labs & imaging results that were available during my care of the patient were reviewed by me and considered in my medical decision making (see chart for details).     Rapid strep is negative.  Throat culture and COVID test is pending.  Will obtain ultrasound given unilateral leg pain with positive Homans' sign.  Patient was encouraged to use over-the-counter medications for symptom relief.  Recommended she rest and drink plenty of fluid.  Discussed alarm symptoms that warrant emergent evaluation.  Strict return precautions given to which patient expressed understanding.  Final Clinical Impressions(s) / UC Diagnoses   Final diagnoses:  Sore throat  Fever and chills  Right leg pain     Discharge Instructions      Your strep was negative.  We will contact you if your COVID is positive.  Please go get ultrasound as we discussed.  Use over-the-counter medications for symptom relief.  If anything worsens please go to the emergency room particularly if you develop chest pain, shortness of breath, leg swelling.     ED Prescriptions   None    PDMP not reviewed this encounter.   Jeani Hawking, PA-C 01/23/21 1404

## 2021-01-23 NOTE — ED Notes (Signed)
Appointment for Ultrasound scheduled at 1400. Hospital location

## 2021-01-24 ENCOUNTER — Ambulatory Visit (INDEPENDENT_AMBULATORY_CARE_PROVIDER_SITE_OTHER): Payer: Medicaid Other | Admitting: Certified Nurse Midwife

## 2021-01-24 ENCOUNTER — Encounter: Payer: Self-pay | Admitting: Certified Nurse Midwife

## 2021-01-24 VITALS — BP 110/73 | HR 72 | Wt 226.8 lb

## 2021-01-24 DIAGNOSIS — A749 Chlamydial infection, unspecified: Secondary | ICD-10-CM

## 2021-01-24 DIAGNOSIS — A549 Gonococcal infection, unspecified: Secondary | ICD-10-CM

## 2021-01-24 MED ORDER — CEFTRIAXONE SODIUM 500 MG IJ SOLR
500.0000 mg | Freq: Once | INTRAMUSCULAR | Status: AC
  Administered 2021-01-24: 500 mg via INTRAMUSCULAR

## 2021-01-24 MED ORDER — DOXYCYCLINE HYCLATE 100 MG PO CAPS
100.0000 mg | ORAL_CAPSULE | Freq: Two times a day (BID) | ORAL | 0 refills | Status: DC
Start: 2021-01-24 — End: 2021-01-24

## 2021-01-24 MED ORDER — DOXYCYCLINE HYCLATE 100 MG PO CAPS: 100.0000 mg | ORAL_CAPSULE | Freq: Two times a day (BID) | ORAL | 0 refills | Status: AC

## 2021-01-24 NOTE — Progress Notes (Signed)
History:  [name] is a 20 y.o. G2P1011 who presents to clinic today for treatment of gonorrhea. Treatment called in for chlamydia yesterday. Pt also complaining of a very sore throat x4 days with no other symptoms. Went to urgent care yesterday and tested negative for covid, strep and flu. Upon discussion, pt divulged she has continued to have vaginal and oral sexual contact with the same partner, who remains untreated for chlamydia/gonorrhea.  The following portions of the patient's history were reviewed and updated as appropriate: allergies, current medications, family history, past medical history, social history, past surgical history and problem list.  Review of Systems:  Pertinent items noted in HPI and remainder of comprehensive ROS otherwise negative.  Objective:  Physical Exam BP 110/73   Pulse 72   Wt 226 lb 12.8 oz (102.9 kg)   LMP 03/07/2020   BMI 37.74 kg/m  Physical Exam Vitals and nursing note reviewed.  Constitutional:      Appearance: Normal appearance.  HENT:     Head: Normocephalic and atraumatic.     Mouth/Throat:     Mouth: Mucous membranes are moist.     Pharynx: Posterior oropharyngeal erythema present. No oropharyngeal exudate.  Eyes:     Pupils: Pupils are equal, round, and reactive to light.  Cardiovascular:     Rate and Rhythm: Normal rate and regular rhythm.     Pulses: Normal pulses.  Pulmonary:     Effort: Pulmonary effort is normal.  Abdominal:     Palpations: Abdomen is soft.  Musculoskeletal:        General: Normal range of motion.     Cervical back: No tenderness.  Skin:    General: Skin is warm and dry.     Capillary Refill: Capillary refill takes less than 2 seconds.  Neurological:     Mental Status: She is alert and oriented to person, place, and time.  Psychiatric:        Mood and Affect: Mood normal.        Behavior: Behavior normal.        Thought Content: Thought content normal.        Judgment: Judgment  normal.   Labs and Imaging Results for orders placed or performed during the hospital encounter of 01/23/21 (from the past 24 hour(s))  POCT Rapid Strep A     Status: None   Collection Time: 01/23/21 12:36 PM  Result Value Ref Range   Streptococcus, Group A Screen (Direct) NEGATIVE NEGATIVE  SARS CORONAVIRUS 2 (TAT 6-24 HRS) Nasopharyngeal Nasopharyngeal Swab     Status: None   Collection Time: 01/23/21 12:41 PM   Specimen: Nasopharyngeal Swab  Result Value Ref Range   SARS Coronavirus 2 NEGATIVE NEGATIVE  POCT Infectious Mono Screen     Status: None   Collection Time: 01/23/21  1:40 PM  Result Value Ref Range   Mono Screen NEGATIVE NEGATIVE   Assessment & Plan:  Gonorrhea and chlamydia infection - treated for gonorrhea today - pt strongly advised not to continue sexual contact of any kind until partner has been treated. Given direct contact info for treatment of gonorrhea at [facility]. - doxycycline sent in by Raelyn Mora, CNM yesterday. Refill sent for partner treatment.  Bernerd Limbo, [place] 01/24/2021 8:37 AM

## 2021-01-26 LAB — CULTURE, GROUP A STREP (THRC)

## 2021-01-27 ENCOUNTER — Telehealth (HOSPITAL_COMMUNITY): Payer: Self-pay | Admitting: Emergency Medicine

## 2021-01-27 MED ORDER — PENICILLIN V POTASSIUM 500 MG PO TABS: 500.0000 mg | ORAL_TABLET | Freq: Two times a day (BID) | ORAL | 0 refills | Status: AC

## 2021-02-05 IMAGING — CR DG CHEST 2V
2 series · 2 of 2 positions shown · non-contrast
Comparison: 10/17/2019

CLINICAL DATA: Chest pain

EXAM:
CHEST - 2 VIEW

[chest pa]
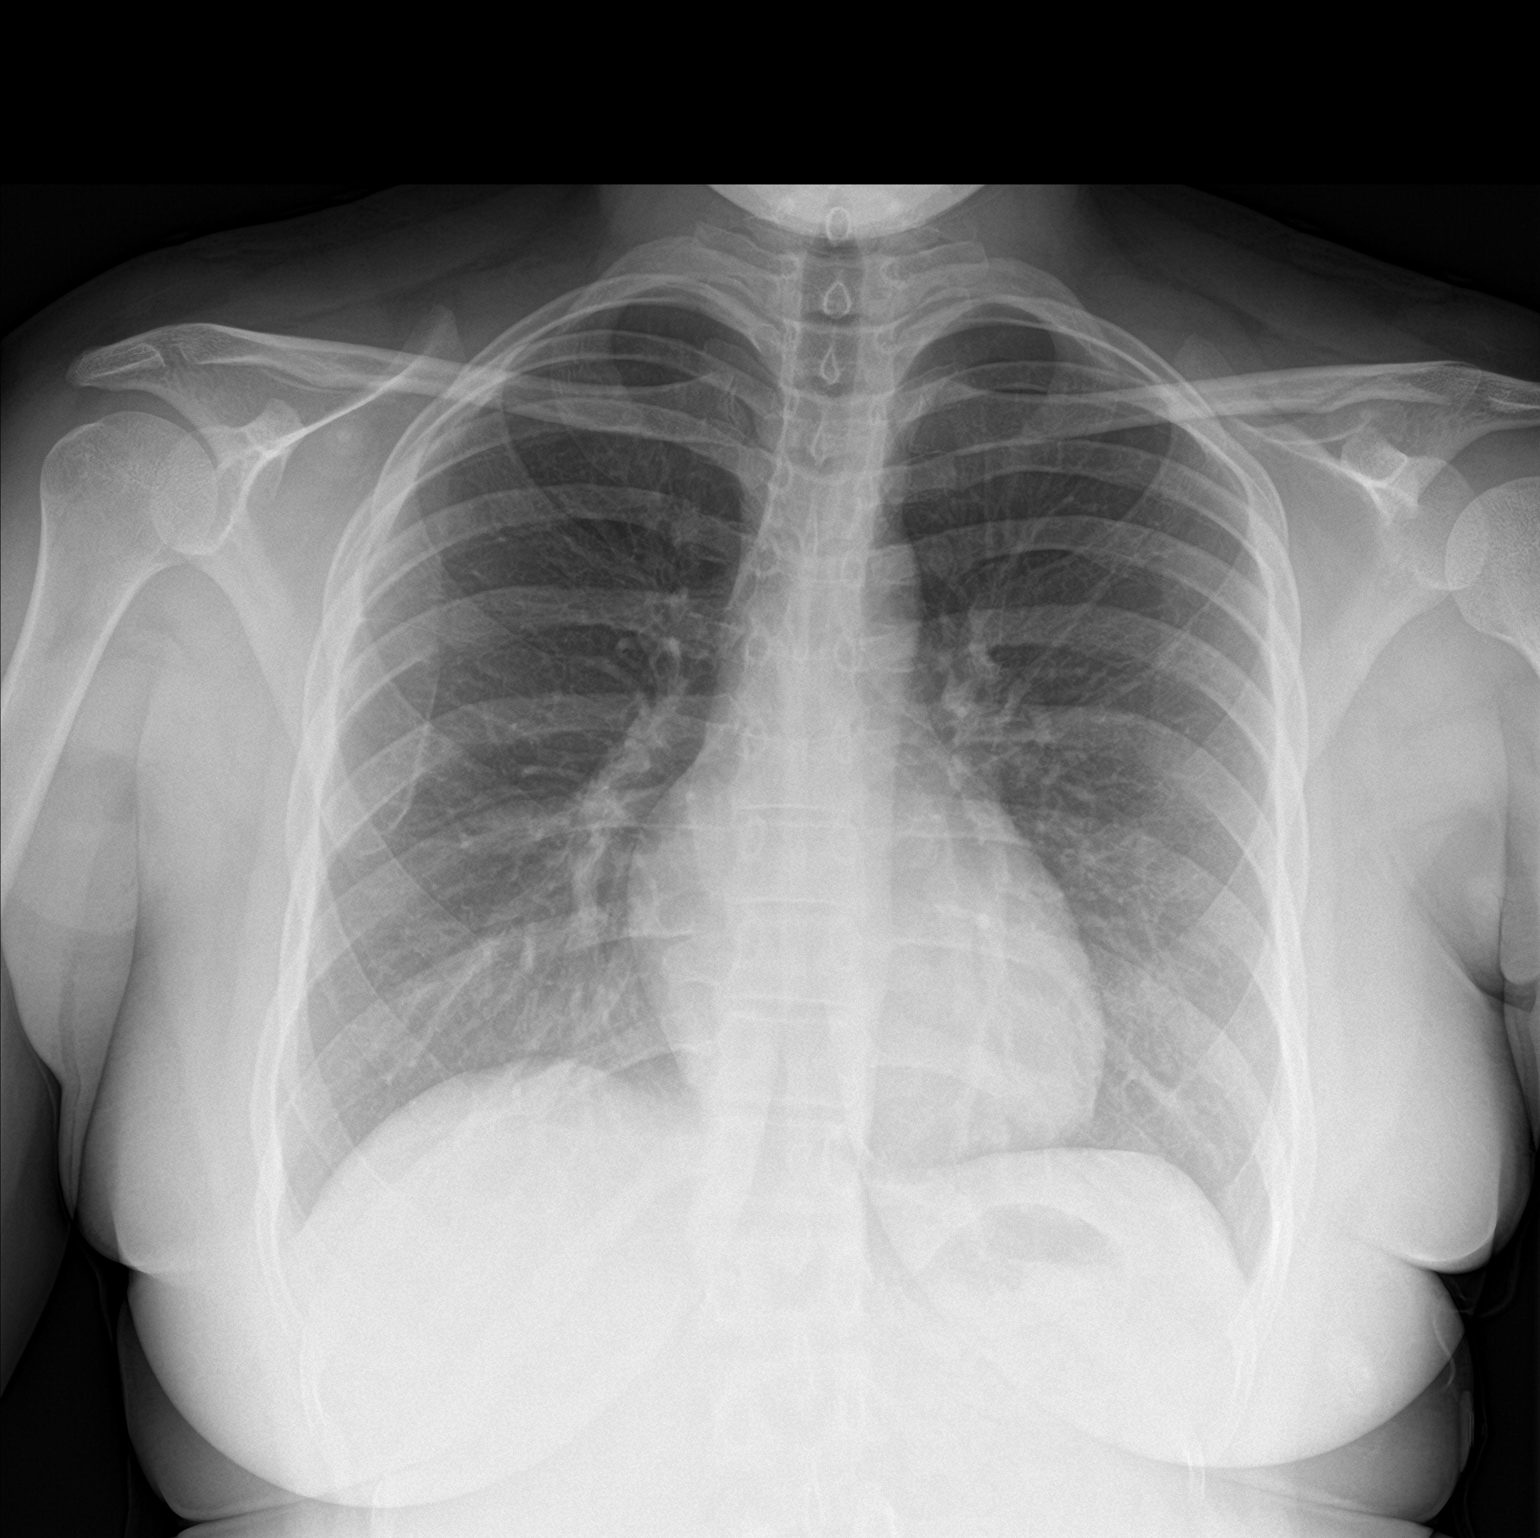

[chest lat]
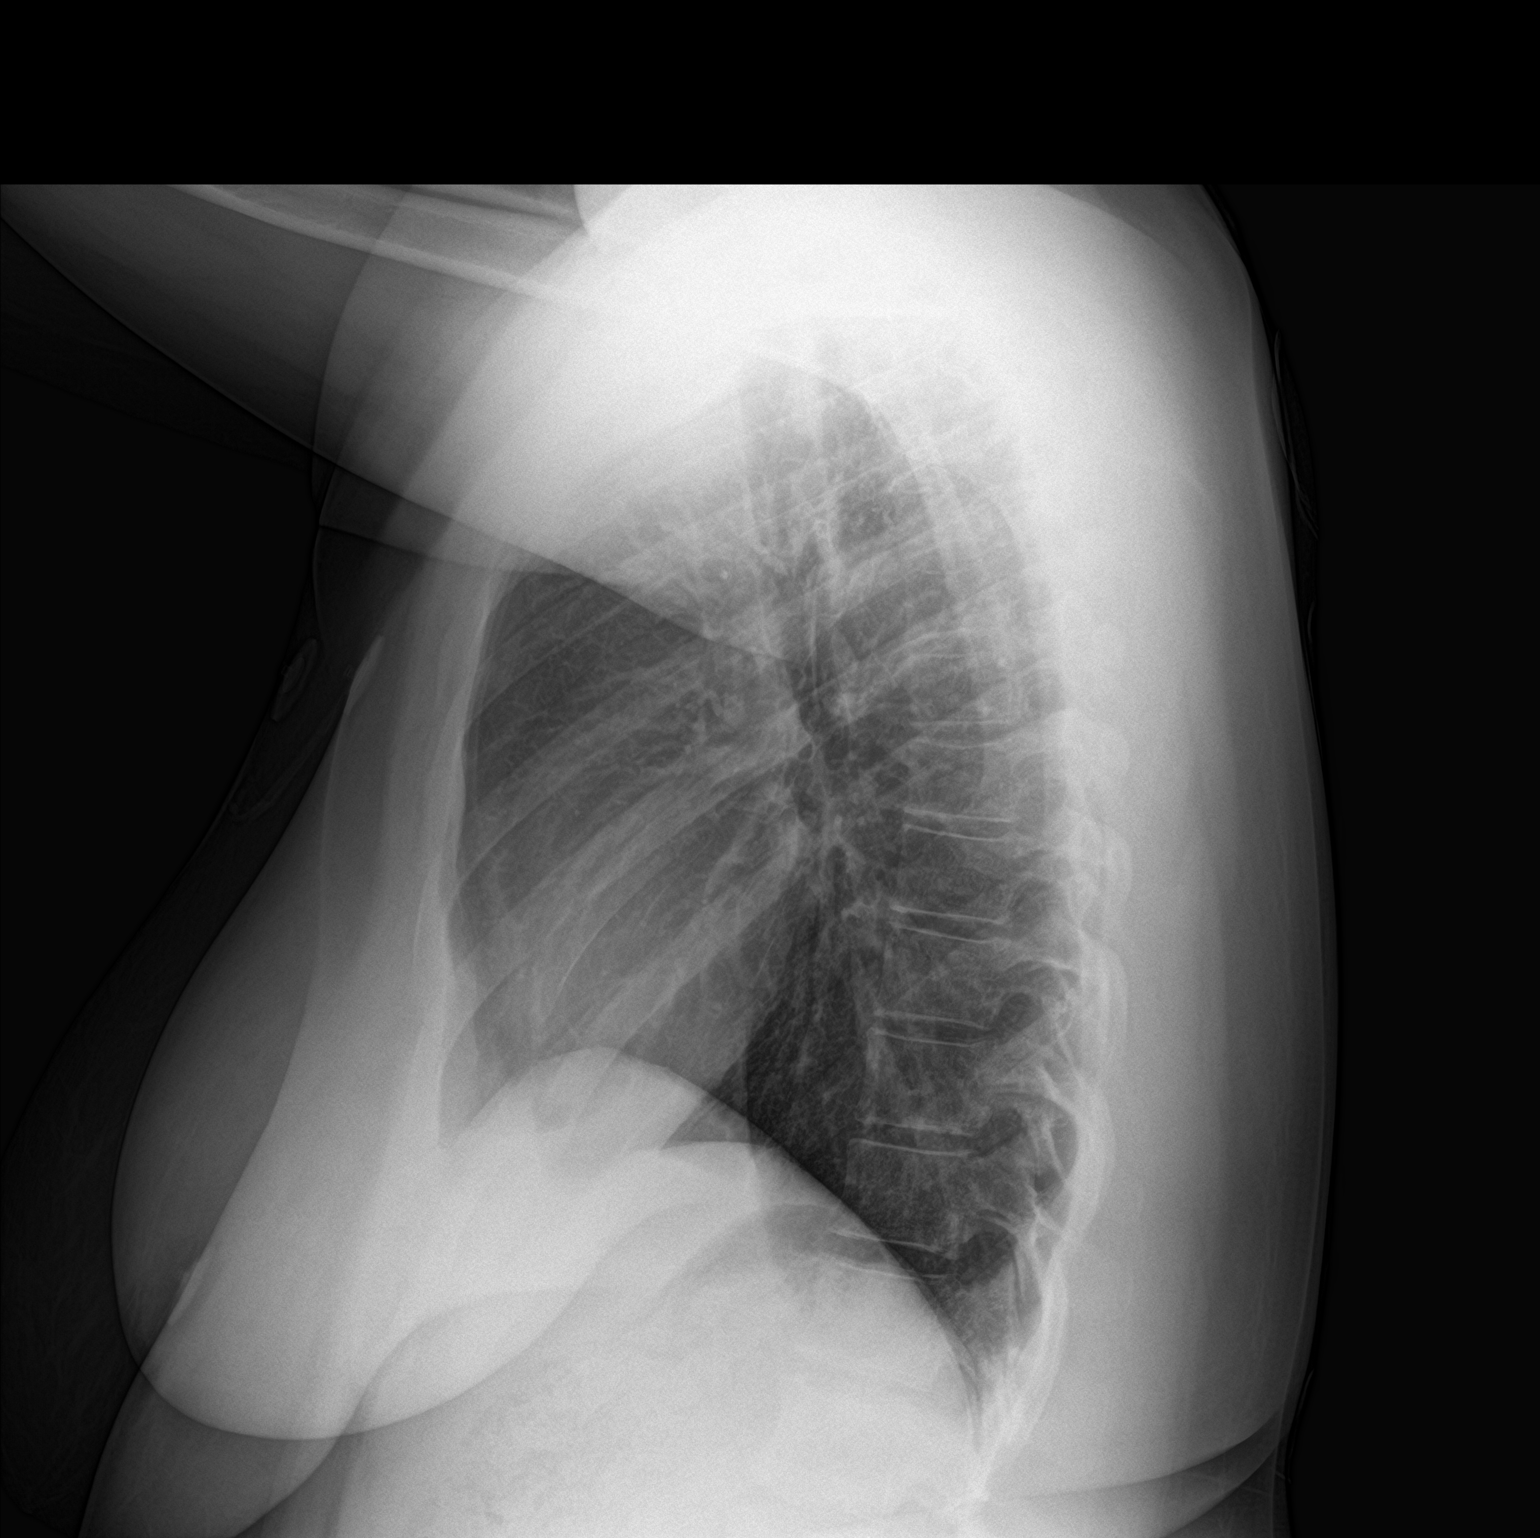

[2 of 2 positions shown; findings below may reference images not displayed]

FINDINGS: The heart size and mediastinal contours are within normal limits.
Both lungs are clear. The visualized skeletal structures are
unremarkable.
IMPRESSION: No active cardiopulmonary disease.

## 2021-02-14 ENCOUNTER — Encounter (HOSPITAL_COMMUNITY): Payer: Self-pay

## 2021-02-14 ENCOUNTER — Ambulatory Visit (HOSPITAL_COMMUNITY)
Admission: EM | Admit: 2021-02-14 | Discharge: 2021-02-14 | Disposition: A | Discharge status: Home / Self Care | Payer: Medicaid Other | Attending: Student | Admitting: Student

## 2021-02-14 ENCOUNTER — Other Ambulatory Visit: Payer: Self-pay

## 2021-02-14 DIAGNOSIS — S86911A Strain of unspecified muscle(s) and tendon(s) at lower leg level, right leg, initial encounter: Secondary | ICD-10-CM | POA: Diagnosis not present

## 2021-02-14 DIAGNOSIS — Z113 Encounter for screening for infections with a predominantly sexual mode of transmission: Secondary | ICD-10-CM | POA: Diagnosis not present

## 2021-02-14 MED ORDER — IBUPROFEN 600 MG PO TABS: 600.0000 mg | ORAL_TABLET | Freq: Four times a day (QID) | ORAL | 0 refills | Status: DC | PRN

## 2021-02-14 NOTE — ED Triage Notes (Signed)
Pt presents with right knee pain X 4 days with no known injury.

## 2021-02-14 NOTE — ED Provider Notes (Signed)
[facility]    CSN: [id number] Arrival date & time: 02/14/21  0160      History   Chief Complaint Chief Complaint  Patient presents with   Knee Pain    HPI Bethany Avery is a 20 y.o. female presenting with nontraumatic R knee pain and STI screening (new female partner, no symptoms). Medical history noncontributory.  -Right knee pain for about 4 days, worse over the anterior aspect of the patella.  Denies trauma, but does state she slept funny because she was sleeping with her new baby.  Also stands all day at work.  Denies sensation changes, falls, trauma. -Also requesting STI screen, denies symptoms.  Recent intercourse with new female partner.  States she cannot be pregnant. Denies hematuria, dysuria, frequency, urgency, back pain, n/v/d/abd pain, fevers/chills, abdnormal vaginal discharge, vaginal rashes/lesions.  HPI  Past Medical History:  Diagnosis Date   ADHD (attention deficit hyperactivity disorder)    Anxiety    Cesarean delivery delivered 12/02/2020   Failure to progress. 5cm   Depression    Gestational diabetes    Gestational diabetes mellitus, class A2 09/28/2020   [facility] Guidelines for Antenatal Testing and Sonography                       Updated  08/12/2020 with Dr. Noralee Space  INDICATION Growth U/S BPP weekly DELIVERY RECOMMENDATION (GA)  Diabetes   A1 - good control     A2 - good control     A2  - poor control or poor compliance    (Macrosomia or polyhydramnios)     A2/B and B-C Pregestational Type II DM    Poor control B-C or D-R-F-T  or  Type I DM     Gestational hypertension 07/03/2020   Gonorrhea 02/02/2018   Headache    Headache in pregnancy, antepartum, second trimester 07/03/2020   Indication for care or intervention in labor or delivery 11/30/2020   Obesity in pregnancy, antepartum 05/31/2020   Positive GBS test 11/25/2020   Pregnant 11/30/2020   Prolonged rupture of membranes 12/02/2020   Supervision of high risk pregnancy, antepartum 05/31/2020     Nursing Staff Provider  Office Location  [facility] Dating  Ultrasound  Language  English Anatomy US  Normal, but limited. 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   Adjustment disorder with mixed anxiety and depressed mood 04/02/2020   Marijuana user 06/22/2018   Gonorrhea 02/02/2018   Adolescent behavior problem    Attention deficit hyperactivity disorder 08/24/2013    Past Surgical History:  Procedure Laterality Date   CESAREAN SECTION N/A 12/02/2020   Procedure: CESAREAN SECTION;  Surgeon: Venora Maples, MD;  Location: [facility];  Service: Obstetrics;  Laterality: N/A;    OB History     Gravida  2   Para  1   Term  1   Preterm      AB  1   Living  1      SAB  1   IAB      Ectopic      Multiple  0   Live Births  1            Home Medications    Prior to Admission medications   Medication Sig Start Date End Date Taking? Authorizing Provider  ibuprofen (ADVIL) 600 MG tablet Take 1 tablet (600 mg total) by mouth every 6 (six) hours as needed. 02/14/21  Yes Rhys Martini, PA-C  acetaminophen (TYLENOL) 500 MG tablet Take 2 tablets (1,000 mg total) by mouth every 6 (six) hours as needed for mild pain (temperature > 101.5.). 12/05/20   Marylene Land, CNM  NIFEdipine (ADALAT CC) 30 MG 24 hr tablet Take 1 tablet (30 mg total) by mouth daily. 12/05/20   Marylene Land, CNM  Prenat w/o A Vit-FeFum-FePo-FA (CONCEPT OB) 130-92.4-1 MG CAPS Take 1 capsule by mouth daily. 04/26/20   Leftwich-Kirby, Wilmer Floor, CNM  metoCLOPramide (REGLAN) 10 MG tablet Take 1 tablet (10 mg total) by mouth every 8 (eight) hours as  needed (take with tylenol for headaches). 07/03/20 12/01/20  Judeth Horn, NP    Family History Family History  Problem Relation Age of Onset   Healthy Mother    Healthy Father     Social History Social History   Tobacco Use   Smoking status: Never   Smokeless tobacco: Never  Vaping Use   Vaping Use: Never used  Substance Use Topics   Alcohol use: Not Currently   Drug use: Not Currently    Types: Marijuana    Comment: used this week     Allergies   Nickel   Review of Systems Review of Systems  Musculoskeletal:        R knee pain  All other systems reviewed and are negative.   Physical Exam Triage Vital Signs ED Triage Vitals  Enc Vitals Group     BP 02/14/21 1057 120/79     Pulse Rate 02/14/21 1057 87     Resp 02/14/21 1057 18     Temp 02/14/21 1057 99.1 F (37.3 C)     Temp Source 02/14/21 1057 Oral     SpO2 02/14/21 1057 98 %     Weight --      Height --      Head Circumference --      Peak Flow --      Pain Score 02/14/21 1055 7     Pain Loc --      Pain Edu? --      Excl. in GC? --    No data found.  Updated Vital Signs BP 120/79 (BP Location: Right Arm)   Pulse 87   Temp 99.1 F (37.3 C) (Oral)   Resp 18   SpO2 98%   Breastfeeding No   Visual Acuity Right Eye Distance:   Left Eye Distance:   Bilateral Distance:    Right Eye Near:   Left Eye Near:    Bilateral Near:     Physical Exam Vitals reviewed.  Constitutional:      General: She is not in acute distress.    Appearance: Normal appearance. She is not ill-appearing.  HENT:     Head: Normocephalic and atraumatic.     Mouth/Throat:     Mouth: Mucous membranes are moist.     Comments: Moist mucous membranes Eyes:     Extraocular Movements: Extraocular movements intact.     Pupils: Pupils are equal, round, and reactive  to light.  Cardiovascular:     Rate and Rhythm: Normal rate and regular rhythm.     Heart sounds: Normal heart sounds.  Pulmonary:     Effort: Pulmonary  effort is normal.     Breath sounds: Normal breath sounds. No wheezing, rhonchi or rales.  Abdominal:     General: Bowel sounds are normal. There is no distension.     Palpations: Abdomen is soft. There is no mass.     Tenderness: There is no abdominal tenderness. There is no right CVA tenderness, left CVA tenderness, guarding or rebound.  Musculoskeletal:     Right knee: No swelling, deformity, effusion, erythema, ecchymosis, lacerations, bony tenderness or crepitus. Normal range of motion. Tenderness present over the patellar tendon. No LCL laxity, MCL laxity, ACL laxity or PCL laxity. Normal alignment, normal meniscus and normal patellar mobility. Normal pulse.     Instability Tests: Anterior drawer test negative. Posterior drawer test negative. Anterior Lachman test negative. Medial McMurray test negative and lateral McMurray test negative.     Left knee: Normal. No swelling, deformity, effusion, erythema, ecchymosis, lacerations, bony tenderness or crepitus. Normal range of motion. No tenderness. No LCL laxity, MCL laxity, ACL laxity or PCL laxity.Normal alignment, normal meniscus and normal patellar mobility. Normal pulse.     Instability Tests: Anterior drawer test negative. Posterior drawer test negative. Anterior Lachman test negative. Medial McMurray test negative and lateral McMurray test negative.     Comments: R Knee: no obvious bony deformity, effusion, skin changes. Knees are symmetric. No tenderness to palpation. No joint laxity or crepitus. ROM flexion and extension intact but pain with extension over patellar tendon. Strength and sensation intact. Negative anterior and posterior drawer. Negative McMurray. Negative valgus and varus stress test. Ambulating with pain. Calves are equal, nontender, and symmetric.  Skin:    General: Skin is warm.     Capillary Refill: Capillary refill takes less than 2 seconds.     Comments: Good skin turgor  Neurological:     General: No focal deficit  present.     Mental Status: She is alert and oriented to person, place, and time.  Psychiatric:        Mood and Affect: Mood normal.        Behavior: Behavior normal.     UC Treatments / Results  Labs (all labs ordered are listed, but only abnormal results are displayed) Labs Reviewed  CERVICOVAGINAL ANCILLARY ONLY    EKG   Radiology No results found.  Procedures Procedures (including critical care time)  Medications Ordered in UC Medications - No data to display  Initial Impression / Assessment and Plan / UC Course  I have reviewed the triage vital signs and the nursing notes.  Pertinent labs & imaging results that were available during my care of the patient were reviewed by me and considered in my medical decision making (see chart for details).     This patient is a very pleasant 20 y.o. year old female presenting with R knee strain and STI screen. Afebrile, nontachycardic, no reproducible abd pain or CVAT.  Asymptomatic, but new female partner. Will send self-swab for G/C, trich, yeast, BV testing. Declines HIV, RPR. Safe sex precautions. States she is not pregnant or breastfeeding despite recent pregnancy.  Trial of knee brace, rest.   ED return precautions discussed. Patient verbalizes understanding and agreement.     Final Clinical Impressions(s) / UC Diagnoses   Final diagnoses:  Routine screening for STI (sexually transmitted infection)  Strain of right knee, initial encounter     Discharge Instructions      -Tylenol, ibuprofen, rest, ice, knee brace while walking/standing   ED Prescriptions     Medication Sig Dispense Auth. Provider   ibuprofen (ADVIL) 600 MG tablet Take 1 tablet (600 mg total) by mouth every 6 (six) hours as needed. 30 tablet Rhys Martini, PA-C      PDMP not reviewed this encounter.   Rhys Martini, PA-C 02/14/21 1209

## 2021-02-14 NOTE — Discharge Instructions (Addendum)
-  Tylenol, ibuprofen, rest, ice, knee brace while walking/standing

## 2021-02-14 NOTE — ED Triage Notes (Signed)
Pt also requesting STD testing with no known symptoms.

## 2021-02-17 LAB — CERVICOVAGINAL ANCILLARY ONLY
Bacterial Vaginitis (gardnerella): POSITIVE — AB
Candida Glabrata: NEGATIVE
Candida Vaginitis: NEGATIVE
Chlamydia: NEGATIVE
Comment: NEGATIVE
Comment: NEGATIVE
Comment: NEGATIVE
Comment: NEGATIVE
Comment: NEGATIVE
Comment: NORMAL
Neisseria Gonorrhea: POSITIVE — AB
Trichomonas: NEGATIVE

## 2021-02-18 ENCOUNTER — Telehealth (HOSPITAL_COMMUNITY): Payer: Self-pay | Admitting: Emergency Medicine

## 2021-02-18 MED ORDER — METRONIDAZOLE 500 MG PO TABS: 500.0000 mg | ORAL_TABLET | Freq: Two times a day (BID) | ORAL | 0 refills | Status: DC

## 2021-02-20 ENCOUNTER — Other Ambulatory Visit: Payer: Self-pay

## 2021-02-20 ENCOUNTER — Ambulatory Visit (INDEPENDENT_AMBULATORY_CARE_PROVIDER_SITE_OTHER): Payer: Medicaid Other

## 2021-02-20 VITALS — BP 107/86 | HR 74

## 2021-02-20 DIAGNOSIS — Z30013 Encounter for initial prescription of injectable contraceptive: Secondary | ICD-10-CM

## 2021-02-20 DIAGNOSIS — Z202 Contact with and (suspected) exposure to infections with a predominantly sexual mode of transmission: Secondary | ICD-10-CM | POA: Diagnosis not present

## 2021-02-20 DIAGNOSIS — Z3042 Encounter for surveillance of injectable contraceptive: Secondary | ICD-10-CM | POA: Diagnosis not present

## 2021-02-20 MED ORDER — CEFTRIAXONE SODIUM 500 MG IJ SOLR
500.0000 mg | Freq: Once | INTRAMUSCULAR | Status: AC
  Administered 2021-02-20: 500 mg via INTRAMUSCULAR

## 2021-02-20 MED ORDER — MEDROXYPROGESTERONE ACETATE 150 MG/ML IM SUSP
150.0000 mg | Freq: Once | INTRAMUSCULAR | Status: AC
  Administered 2021-02-20: 150 mg via INTRAMUSCULAR

## 2021-02-20 NOTE — Progress Notes (Signed)
Patient presented to the office today for depo-provera injection and STD treatment. Patient tested +for Gonorrhea and BV.  Depo-provera given by: D.Cheree Ditto in left arm IM. GYB#[id number] HCG Serum: N/A Side Effects: none  Patient treated for Gonorrhea with Ceftriaxone 500 mg administer in LUOQ. Inform patient she will need to make her partner or partners aware of diagnosis. No sexual intercourse for 7-10 days after treatment has been started. She will need to return to the office in 4 weeks for TOC. Side Effects: None NDC#[id number]  Follow up care in 4 weeks for STI.  Depo-Provera in 3 months.

## 2021-02-20 NOTE — Addendum Note (Signed)
Addended by: Cheree Ditto, Ailea Rhatigan A on: 02/20/2021 01:51 PM   Modules accepted: Orders

## 2021-02-22 ENCOUNTER — Ambulatory Visit (HOSPITAL_COMMUNITY)
Admission: EM | Admit: 2021-02-22 | Discharge: 2021-02-22 | Disposition: A | Discharge status: Home / Self Care | Payer: Medicaid Other

## 2021-02-22 ENCOUNTER — Other Ambulatory Visit: Payer: Self-pay

## 2021-04-15 ENCOUNTER — Other Ambulatory Visit: Payer: Self-pay

## 2021-04-15 ENCOUNTER — Encounter (HOSPITAL_COMMUNITY): Payer: Self-pay | Admitting: Emergency Medicine

## 2021-04-15 ENCOUNTER — Ambulatory Visit (HOSPITAL_COMMUNITY)
Admission: EM | Admit: 2021-04-15 | Discharge: 2021-04-15 | Disposition: A | Discharge status: Home / Self Care | Payer: Medicaid Other | Attending: Emergency Medicine | Admitting: Emergency Medicine

## 2021-04-15 DIAGNOSIS — Z3202 Encounter for pregnancy test, result negative: Secondary | ICD-10-CM | POA: Insufficient documentation

## 2021-04-15 DIAGNOSIS — R051 Acute cough: Secondary | ICD-10-CM | POA: Insufficient documentation

## 2021-04-15 DIAGNOSIS — N760 Acute vaginitis: Secondary | ICD-10-CM | POA: Diagnosis present

## 2021-04-15 LAB — POC URINE PREG, ED: Preg Test, Ur: NEGATIVE

## 2021-04-15 LAB — POC INFLUENZA A AND B ANTIGEN (URGENT CARE ONLY)
INFLUENZA A ANTIGEN, POC: NEGATIVE
INFLUENZA B ANTIGEN, POC: NEGATIVE

## 2021-04-15 MED ORDER — ONDANSETRON HCL 4 MG PO TABS: 4.0000 mg | ORAL_TABLET | Freq: Four times a day (QID) | ORAL | 0 refills | Status: DC

## 2021-04-15 MED ORDER — FLUCONAZOLE 150 MG PO TABS: 150.0000 mg | ORAL_TABLET | Freq: Once | ORAL | 1 refills | Status: AC

## 2021-04-15 MED ORDER — ONDANSETRON 4 MG PO TBDP: 4.0000 mg | ORAL_TABLET | Freq: Three times a day (TID) | ORAL | 0 refills | Status: DC | PRN

## 2021-04-15 NOTE — ED Provider Notes (Signed)
HPI  SUBJECTIVE:  Bethany Avery is a 20 y.o. female who presents with 2 issues: First, she reports cough, 1 episode of emesis and diarrhea starting 2 days ago.  No fevers, body aches, headaches, nasal congestion, rhinorrhea, postnasal drip, sore throat, wheezing, shortness of breath, nausea.  She reports low abdominal cramping consistent with menses.  No known COVID or flu exposure.  She did not get the COVID or flu vaccines.  She has been exposed to people with similar symptoms.  No aggravating or alleviating factors.  She has not tried anything for this.  Her baby is also ill, but got sick after she did.    Second, she reviewed reports vaginal irritation for the past week and a half.  She reports vaginal odor that has resolved.  She also reports low abdominal cramping, vaginal bleeding/spotting today.  She did not have menses last month.  She reports vaginal itching, no genital rash.  No blisters, ulcers.  She reports discharge for 2 days, but this has also returned to baseline.  No urinary complaints.  She is in a sexual relationship with a female, who is asymptomatic to her knowledge.  She denies having any other partners, but is not sure about him.  No recent antibiotics.  No aggravating or alleviating factors.  She has not tried anything for this.  She has a past medical history of gestational hypertension, gestational diabetes, which has resolved, chlamydia, trichomonas, BV, yeast.  No other history of STDs, PID.  LMP: 2 months ago.  She is not sure if she is pregnant.  She is on Depo.  PMD: [facility].    Past Medical History:  Diagnosis Date   ADHD (attention deficit hyperactivity disorder)    Anxiety    Cesarean delivery delivered 12/02/2020   Failure to progress. 5cm   Depression    Gestational diabetes    Gestational diabetes mellitus, class A2 09/28/2020   [facility] Guidelines for Antenatal Testing and Sonography                       Updated  08/12/2020 with Dr. Noralee Space   INDICATION Growth U/S BPP weekly DELIVERY RECOMMENDATION (GA)  Diabetes   A1 - good control     A2 - good control     A2  - poor control or poor compliance    (Macrosomia or polyhydramnios)     A2/B and B-C Pregestational Type II DM    Poor control B-C or D-R-F-T  or  Type I DM     Gestational hypertension 07/03/2020   Gonorrhea 02/02/2018   Headache    Headache in pregnancy, antepartum, second trimester 07/03/2020   Indication for care or intervention in labor or delivery 11/30/2020   Obesity in pregnancy, antepartum 05/31/2020   Positive GBS test 11/25/2020   Pregnant 11/30/2020   Prolonged rupture of membranes 12/02/2020   Supervision of high risk pregnancy, antepartum 05/31/2020    Nursing Staff Provider  Office Location  [facility] Dating  Ultrasound  Language  English Anatomy US  Normal, but limited. F/U Scheduled  Flu Vaccine   Genetic Screen  NIPS: low risk female AFP:    TDaP Vaccine   09/26/20 Hgb A1C or  GTT Early   A1C 5.4 Third trimester Abnormal  Glucose, Fasting 65 - 91 mg/dL 94 High    Glucose, 1 hour 65 - 179 mg/dL 800   Glucose, 2 hour 65 - 152 mg/dL 93     COVID  Past Surgical History:  Procedure Laterality Date   CESAREAN SECTION N/A 12/02/2020   Procedure: CESAREAN SECTION;  Surgeon: Venora Maples, MD;  Location: [facility];  Service: Obstetrics;  Laterality: N/A;    Family History  Problem Relation Age of Onset   Healthy Mother    Healthy Father     Social History   Tobacco Use   Smoking status: Never   Smokeless tobacco: Never  Vaping Use   Vaping Use: Never used  Substance Use Topics   Alcohol use: Not Currently   Drug use: Not Currently    Types: Marijuana    Comment: used this week    No current facility-administered medications for this encounter.  Current Outpatient Medications:    ondansetron (ZOFRAN-ODT) 4 MG disintegrating tablet, Take 1 tablet (4 mg total) by mouth every 8 (eight) hours as needed for nausea or vomiting., Disp: 20 tablet, Rfl: 0    acetaminophen (TYLENOL) 500 MG tablet, Take 2 tablets (1,000 mg total) by mouth every 6 (six) hours as needed for mild pain (temperature > 101.5.)., Disp: 30 tablet, Rfl: 0   ibuprofen (ADVIL) 600 MG tablet, Take 1 tablet (600 mg total) by mouth every 6 (six) hours as needed., Disp: 30 tablet, Rfl: 0   NIFEdipine (ADALAT CC) 30 MG 24 hr tablet, Take 1 tablet (30 mg total) by mouth daily., Disp: 90 tablet, Rfl: 0  Allergies  Allergen Reactions   Nickel Rash     ROS  As noted in HPI.   Physical Exam  BP (!) 142/91   Pulse 97   Temp (!) 97 F (36.1 C) (Oral)   Resp 18   SpO2 100%   Breastfeeding No   Constitutional: Well developed, well nourished, no acute distress Eyes:  EOMI, conjunctiva normal bilaterally HENT: Normocephalic, atraumatic,mucus membranes moist.  No nasal congestion. Respiratory: Normal inspiratory effort, lungs clear bilaterally Cardiovascular: Normal rate, regular rhythm, no murmurs rubs or gallops.   GI: nondistended soft.  Mild suprapubic tenderness Back: No CVAT skin: No rash, skin intact Musculoskeletal: no deformities Neurologic: Alert & oriented x 3, no focal neuro deficits Psychiatric: Speech and behavior appropriate   ED Course   Medications - No data to display  Orders Placed This Encounter  Procedures   POC urine pregnancy    Standing Status:   Standing    Number of Occurrences:   1   POC Influenza A & B Ag (Urgent Care)    Standing Status:   Standing    Number of Occurrences:   1    Results for orders placed or performed during the hospital encounter of 04/15/21 (from the past 24 hour(s))  POC urine pregnancy     Status: None   Collection Time: 04/15/21  9:47 AM  Result Value Ref Range   Preg Test, Ur NEGATIVE NEGATIVE  POC Influenza A & B Ag (Urgent Care)     Status: None   Collection Time: 04/15/21  9:58 AM  Result Value Ref Range   INFLUENZA A ANTIGEN, POC NEGATIVE NEGATIVE   INFLUENZA B ANTIGEN, POC NEGATIVE NEGATIVE   No  results found.  ED Clinical Impression  1. Acute cough   2. Acute vaginitis   3. Negative pregnancy test      ED Assessment/Plan  1.  Cough.  Rapid flu testing negative.  Suspect URI.  She reports 1 episode of emesis yesterday and some diarrhea.  Otherwise has no symptoms.  She was recently exposed to people with  upper respiratory infections.  Will send home with Zofran, advised Imodium and may take OTC cough medicine as needed for symptoms.  Patient son tested positive for influenza A with PCR testing.  Staff to notify patient and to offer her prescription of Tamiflu.  If she wants it, will call in a prescription of Tamiflu for 5 days.  2.  Vaginal irritation.  Vaginal swab sent testing for STDs, BV and yeast.  Suspect yeast.  Will send home with Diflucan.  Also checking urine pregnancy.  Follow-up with PMD as needed.  Discussed labs, MDM, treatment plan, and plan for follow-up with patient.  patient agrees with plan.   Meds ordered this encounter  Medications   fluconazole (DIFLUCAN) 150 MG tablet    Sig: Take 1 tablet (150 mg total) by mouth once for 1 dose. 1 tab po x 1. May repeat in 72 hours if no improvement    Dispense:  2 tablet    Refill:  1   DISCONTD: ondansetron (ZOFRAN) 4 MG tablet    Sig: Take 1 tablet (4 mg total) by mouth every 6 (six) hours.    Dispense:  20 tablet    Refill:  0   ondansetron (ZOFRAN-ODT) 4 MG disintegrating tablet    Sig: Take 1 tablet (4 mg total) by mouth every 8 (eight) hours as needed for nausea or vomiting.    Dispense:  20 tablet    Refill:  0       *This clinic note was created using Scientist, clinical (histocompatibility and immunogenetics). Therefore, there may be occasional mistakes despite careful proofreading.  ?    Domenick Gong, MD 04/16/21 [phone]

## 2021-04-15 NOTE — ED Triage Notes (Addendum)
Pt is present today with a cough and fever. Pt sx started x2 days ago   Pt is vaginal irritation started x2 weeks ago.

## 2021-04-15 NOTE — Discharge Instructions (Addendum)
I am sending home with Zofran which will help with future episodes of nausea, vomiting.  I will also cause constipation, so we will slow down the diarrhea.  Push extra electrolyte containing fluids.  If you have continued diarrhea, try some Imodium.  Try some over-the-counter cough medicine as needed for cough.  I will contact you if and only if your influenza is positive.  You do not hear from me by the end of the day, you can assume that is negative  Your urine pregnancy was negative today.  your BV, yeast STD testing will be back in several days.  I am treating you empirically for vaginal yeast infection.  We will contact you if we need to add any other medications.  No intercourse until your symptoms resolve and all of your labs are resulted.

## 2021-04-16 ENCOUNTER — Telehealth (HOSPITAL_COMMUNITY): Payer: Self-pay | Admitting: Emergency Medicine

## 2021-04-16 LAB — CERVICOVAGINAL ANCILLARY ONLY
Bacterial Vaginitis (gardnerella): POSITIVE — AB
Candida Glabrata: NEGATIVE
Candida Vaginitis: NEGATIVE
Chlamydia: NEGATIVE
Comment: NEGATIVE
Comment: NEGATIVE
Comment: NEGATIVE
Comment: NEGATIVE
Comment: NEGATIVE
Comment: NORMAL
Neisseria Gonorrhea: NEGATIVE
Trichomonas: NEGATIVE

## 2021-04-16 MED ORDER — METRONIDAZOLE 500 MG PO TABS: 500.0000 mg | ORAL_TABLET | Freq: Two times a day (BID) | ORAL | 0 refills | Status: DC

## 2021-05-09 ENCOUNTER — Encounter: Payer: Self-pay | Admitting: Obstetrics and Gynecology

## 2021-05-09 ENCOUNTER — Ambulatory Visit: Payer: Medicaid Other | Admitting: Obstetrics and Gynecology

## 2021-05-09 DIAGNOSIS — N898 Other specified noninflammatory disorders of vagina: Secondary | ICD-10-CM

## 2021-05-09 MED ORDER — FLUCONAZOLE 150 MG PO TABS: ORAL_TABLET | ORAL | 0 refills | Status: DC

## 2021-05-12 ENCOUNTER — Ambulatory Visit: Payer: Medicaid Other

## 2021-05-22 ENCOUNTER — Ambulatory Visit: Payer: Medicaid Other | Admitting: Obstetrics and Gynecology

## 2021-05-29 ENCOUNTER — Encounter: Payer: Self-pay | Admitting: Obstetrics and Gynecology

## 2021-06-02 ENCOUNTER — Ambulatory Visit (HOSPITAL_COMMUNITY)
Admission: EM | Admit: 2021-06-02 | Discharge: 2021-06-02 | Disposition: A | Discharge status: Home / Self Care | Payer: Medicaid Other | Attending: Family Medicine | Admitting: Family Medicine

## 2021-06-02 ENCOUNTER — Encounter (HOSPITAL_COMMUNITY): Payer: Self-pay

## 2021-06-02 ENCOUNTER — Other Ambulatory Visit: Payer: Self-pay

## 2021-06-02 DIAGNOSIS — N939 Abnormal uterine and vaginal bleeding, unspecified: Secondary | ICD-10-CM

## 2021-06-02 LAB — POC URINE PREG, ED: Preg Test, Ur: NEGATIVE

## 2021-06-02 MED ORDER — MEDROXYPROGESTERONE ACETATE 10 MG PO TABS: 10.0000 mg | ORAL_TABLET | Freq: Every day | ORAL | 0 refills | Status: DC

## 2021-06-02 NOTE — ED Notes (Signed)
Called pt in waiting area.  No response 

## 2021-06-02 NOTE — ED Triage Notes (Signed)
Pt states having heavy vaginal bleeding with lower abdominal and lower back pain. States going through 1 diaper every 2 hrs. States had a c-section 6 months ago. Had last depo shot 02/20/2021. States last normal menstrual cycle was 05/11/2021.

## 2021-06-02 NOTE — ED Provider Notes (Signed)
[facility]    CSN: [id number] Arrival date & time: 06/02/21  1308      History   Chief Complaint Chief Complaint  Patient presents with   Vaginal Bleeding    HPI Bethany Avery is a 21 y.o. female.    Vaginal Bleeding Here with vaginal bleeding since 12/29, and it has been heavy. Is having some lower abd cramping and some low back pain. No dysuria. No f/c.   LNMP was 12/18-12/24. Had one also on 11/18. No period in October. Got a depoprovera 9/29, and was going to ask her provider to change it.  G1P1, s/p C/S 6 months ago  Past Medical History:  Diagnosis Date   ADHD (attention deficit hyperactivity disorder)    Anxiety    Cesarean delivery delivered 12/02/2020   Failure to progress. 5cm   Depression    Gestational diabetes    Gestational diabetes mellitus, class A2 09/28/2020   [facility] Guidelines for Antenatal Testing and Sonography                       Updated  08/12/2020 with Dr. Noralee Space  INDICATION Growth U/S BPP weekly DELIVERY RECOMMENDATION (GA)  Diabetes   A1 - good control     A2 - good control     A2  - poor control or poor compliance    (Macrosomia or polyhydramnios)     A2/B and B-C Pregestational Type II DM    Poor control B-C or D-R-F-T  or  Type I DM     Gestational hypertension 07/03/2020   Gonorrhea 02/02/2018   Headache    Headache in pregnancy, antepartum, second trimester 07/03/2020   Indication for care or intervention in labor or delivery 11/30/2020   Obesity in pregnancy, antepartum 05/31/2020   Positive GBS test 11/25/2020   Pregnant 11/30/2020   Prolonged rupture of membranes 12/02/2020   Supervision of high risk pregnancy, antepartum 05/31/2020    Nursing Staff Provider  Office Location  [facility] Dating  Ultrasound  Language  English Anatomy US  Normal, but limited. 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   Adjustment disorder with mixed anxiety and depressed mood 04/02/2020   Marijuana user 06/22/2018   Gonorrhea 02/02/2018   Adolescent behavior problem    Attention deficit hyperactivity disorder 08/24/2013    Past Surgical History:  Procedure Laterality Date   CESAREAN SECTION N/A 12/02/2020   Procedure: CESAREAN SECTION;  Surgeon: Venora Maples, MD;  Location: [facility];  Service: Obstetrics;  Laterality: N/A;    OB History     Gravida  2   Para  1   Term  1   Preterm      AB  1   Living  1      SAB  1   IAB      Ectopic      Multiple  0   Live Births  1  Home Medications    Prior to Admission medications   Medication Sig Start Date End Date Taking? Authorizing Provider  medroxyPROGESTERone (PROVERA) 10 MG tablet Take 1 tablet (10 mg total) by mouth daily for 10 days. 06/02/21 06/12/21 Yes Juanpablo Ciresi, Janace Aris, MD  metoCLOPramide (REGLAN) 10 MG tablet Take 1 tablet (10 mg total) by mouth every 8 (eight) hours as needed (take with tylenol for headaches). 07/03/20 12/01/20  Judeth Horn, NP    Family History Family History  Problem Relation Age of Onset   Healthy Mother    Healthy Father     Social History Social History   Tobacco Use   Smoking status: Never   Smokeless tobacco: Never  Vaping Use   Vaping Use: Never used  Substance Use Topics   Alcohol use: Not Currently   Drug use: Not Currently    Types: Marijuana    Comment: used this week     Allergies   Nickel   Review of Systems Review of Systems  Genitourinary:  Positive for vaginal bleeding.    Physical Exam Triage Vital Signs ED Triage Vitals [06/02/21 1455]  Enc Vitals Group     BP 128/86     Pulse Rate 98     Resp 18     Temp 98.7 F (37.1 C)      Temp Source Oral     SpO2 100 %     Weight      Height      Head Circumference      Peak Flow      Pain Score 9     Pain Loc      Pain Edu?      Excl. in GC?    No data found.  Updated Vital Signs BP 128/86 (BP Location: Left Arm)    Pulse 98    Temp 98.7 F (37.1 C) (Oral)    Resp 18    LMP 05/11/2021    SpO2 100%    Breastfeeding Yes   Visual Acuity Right Eye Distance:   Left Eye Distance:   Bilateral Distance:    Right Eye Near:   Left Eye Near:    Bilateral Near:     Physical Exam Vitals reviewed.  Constitutional:      General: She is not in acute distress.    Appearance: She is not toxic-appearing.  HENT:     Mouth/Throat:     Mouth: Mucous membranes are moist.  Eyes:     Extraocular Movements: Extraocular movements intact.     Pupils: Pupils are equal, round, and reactive to light.  Cardiovascular:     Rate and Rhythm: Normal rate and regular rhythm.     Heart sounds: No murmur heard. Pulmonary:     Effort: Pulmonary effort is normal.     Breath sounds: Normal breath sounds.  Abdominal:     Palpations: Abdomen is soft.     Tenderness: There is abdominal tenderness (lower quadrants and suprpubic).  Musculoskeletal:     Cervical back: Neck supple.  Lymphadenopathy:     Cervical: No cervical adenopathy.  Skin:    Capillary Refill: Capillary refill takes less than 2 seconds.     Coloration: Skin is not jaundiced or pale.  Neurological:     General: No focal deficit present.     Mental Status: She is alert and oriented to person, place, and time.  Psychiatric:        Behavior: Behavior normal.     UC  Treatments / Results  Labs (all labs ordered are listed, but only abnormal results are displayed) Labs Reviewed  POC URINE PREG, ED    EKG   Radiology No results found.  Procedures Procedures (including critical care time)  Medications Ordered in UC Medications - No data to display  Initial Impression / Assessment and Plan / UC Course  I  have reviewed the triage vital signs and the nursing notes.  Pertinent labs & imaging results that were available during my care of the patient were reviewed by me and considered in my medical decision making (see chart for details).     Upt negative Final Clinical Impressions(s) / UC Diagnoses   Final diagnoses:  Abnormal uterine bleeding (AUB)     Discharge Instructions      Your pregnancy test was negative   Take medroxyprogesterone daily for 10 days. Druing those 10 days, your vaginal bleeding should lessen and stop. You should have a withdrawal bleed/period a few days after you stop taking it.  Get in with your regular doctor, especially if the bleeding doesn't stop.  Get seen on an urgent basis if you get dizzier, almost pass out.     ED Prescriptions     Medication Sig Dispense Auth. Provider   medroxyPROGESTERone (PROVERA) 10 MG tablet Take 1 tablet (10 mg total) by mouth daily for 10 days. 10 tablet Marlinda MikeBanister, Janace ArisPamela K, MD      PDMP not reviewed this encounter.   Zenia ResidesBanister, Raahi Korber K, MD 06/02/21 [phone]

## 2021-06-02 NOTE — Discharge Instructions (Signed)
Your pregnancy test was negative   Take medroxyprogesterone daily for 10 days. Druing those 10 days, your vaginal bleeding should lessen and stop. You should have a withdrawal bleed/period a few days after you stop taking it.  Get in with your regular doctor, especially if the bleeding doesn't stop.  Get seen on an urgent basis if you get dizzier, almost pass out.

## 2021-06-04 ENCOUNTER — Ambulatory Visit: Payer: Medicaid Other | Admitting: Certified Nurse Midwife

## 2021-08-10 IMAGING — US US OB TRANSVAGINAL
1 series · 15 of 28 positions shown · non-contrast
Comparison: 07/25/2019.

CLINICAL DATA: Evaluation for fetal viability.

EXAM:
OBSTETRIC <14 WK US AND TRANSVAGINAL OB US
TECHNIQUE: Both transabdominal and transvaginal ultrasound examinations were
performed for complete evaluation of the gestation as well as the
maternal uterus, adnexal regions, and pelvic cul-de-sac.
Transvaginal technique was performed to assess early pregnancy.

[Series 1: us ob transvaginal · 15 of 45 slices shown]
[im 1/45]
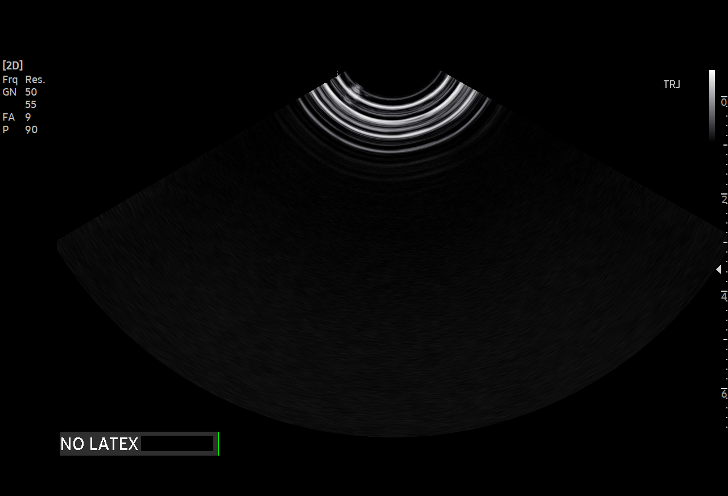
[im 4/45]
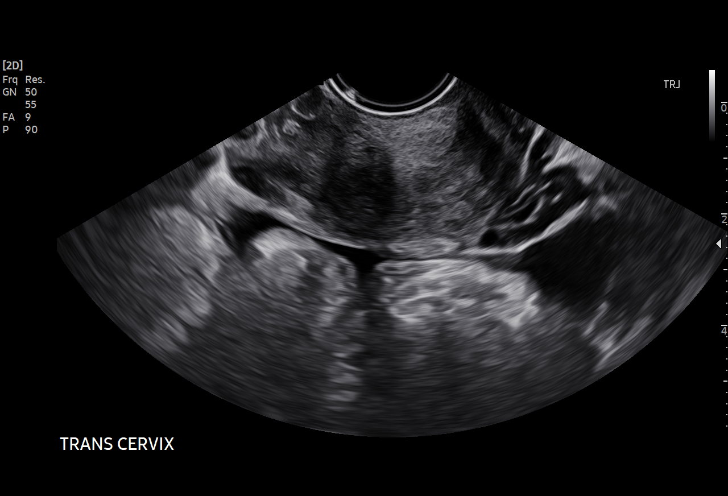
[im 7/45]
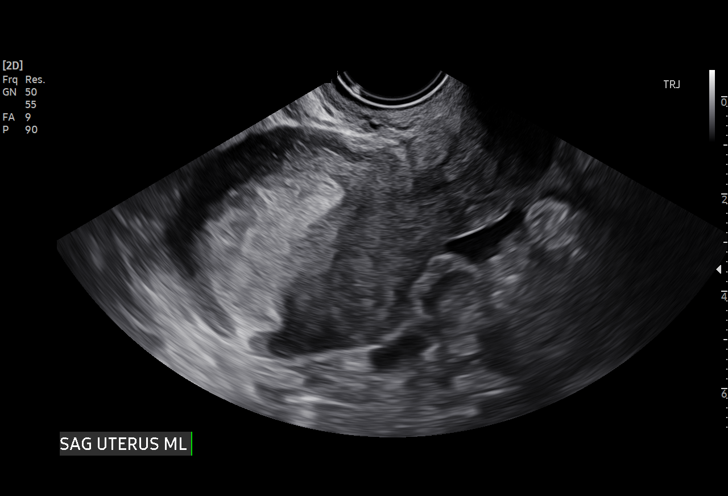
[im 10/45]
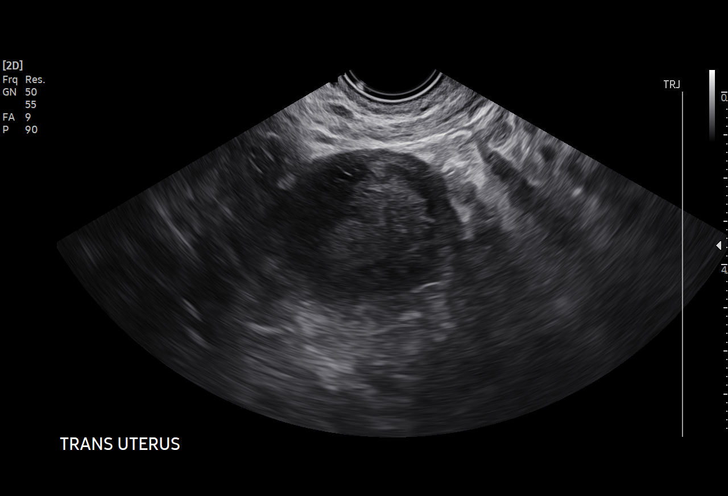
[im 14/45]
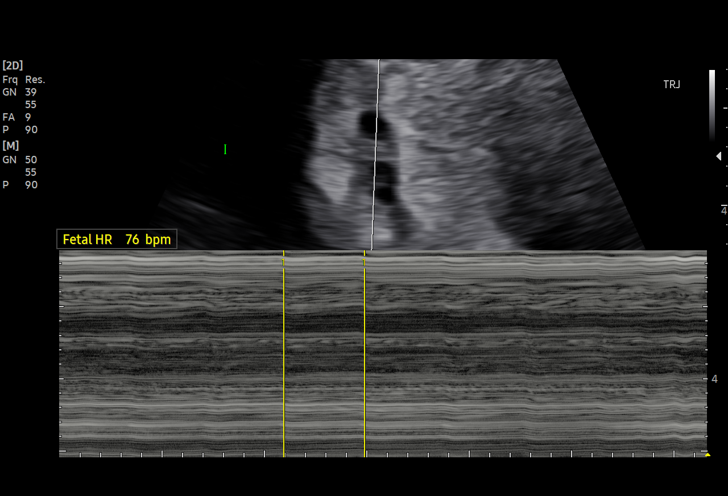
[im 17/45]
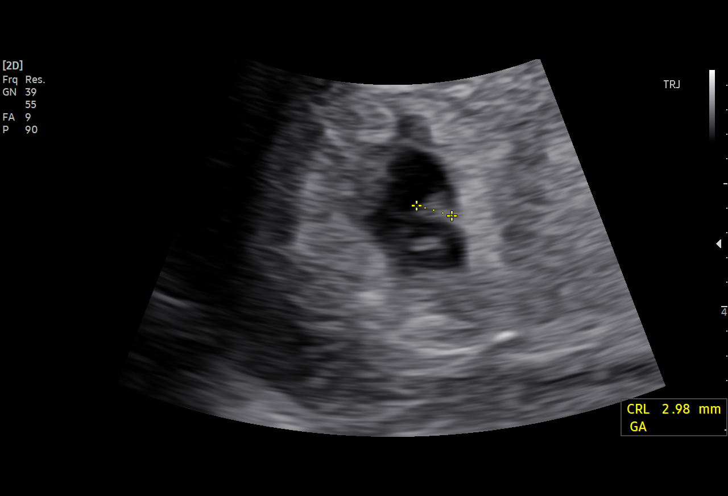
[im 20/45]
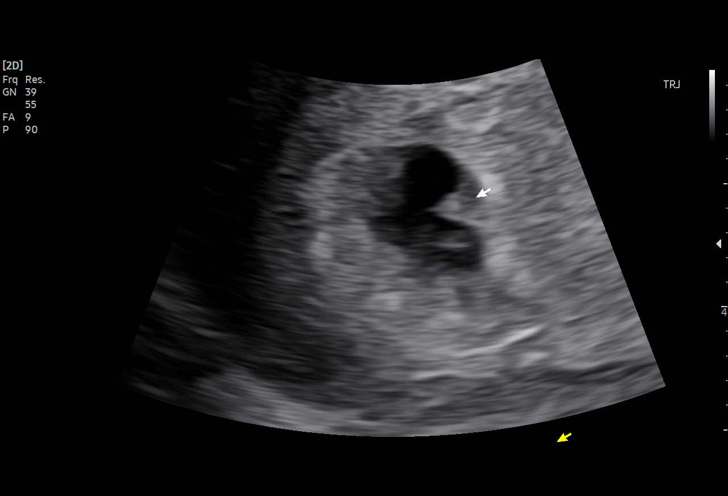
[im 23/45]
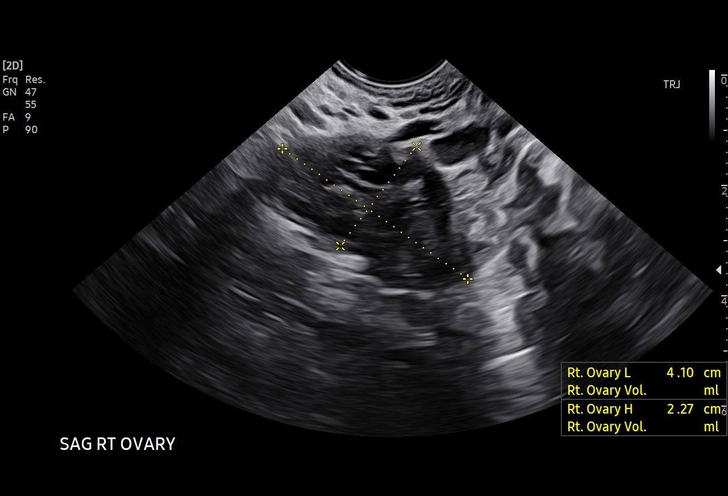
[im 25/45]
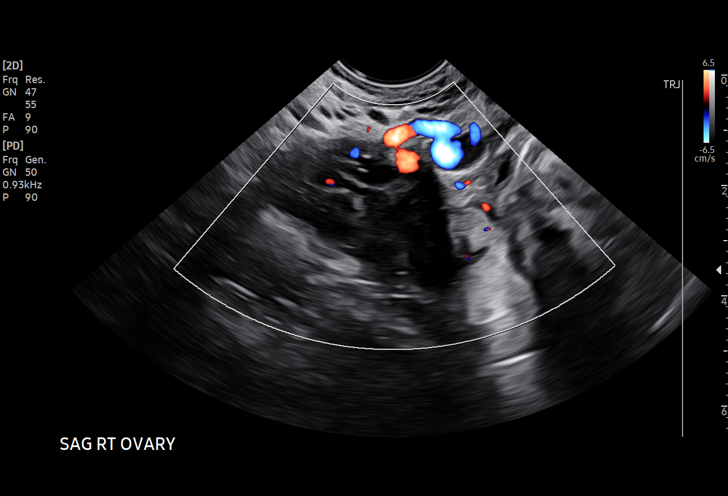
[im 28/45]
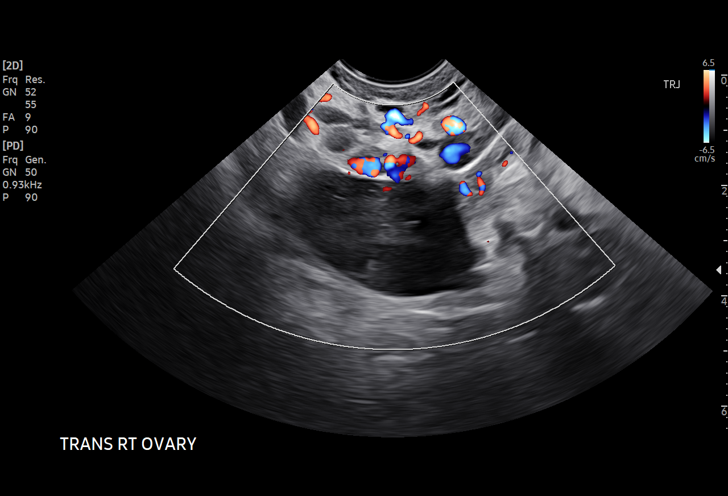
[im 31/45]
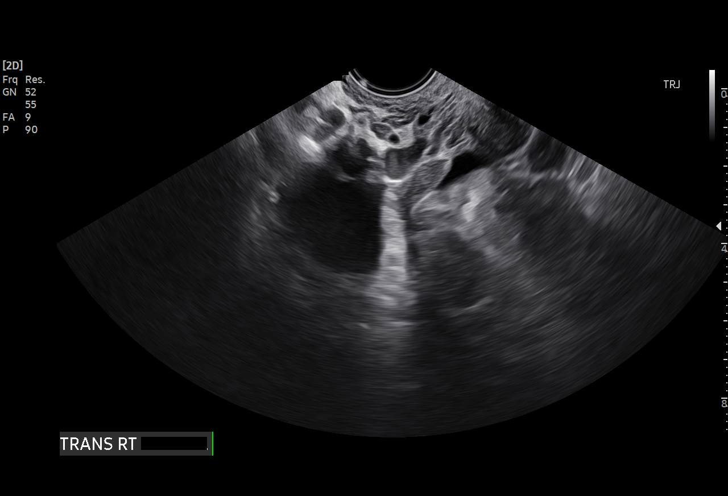
[im 35/45]
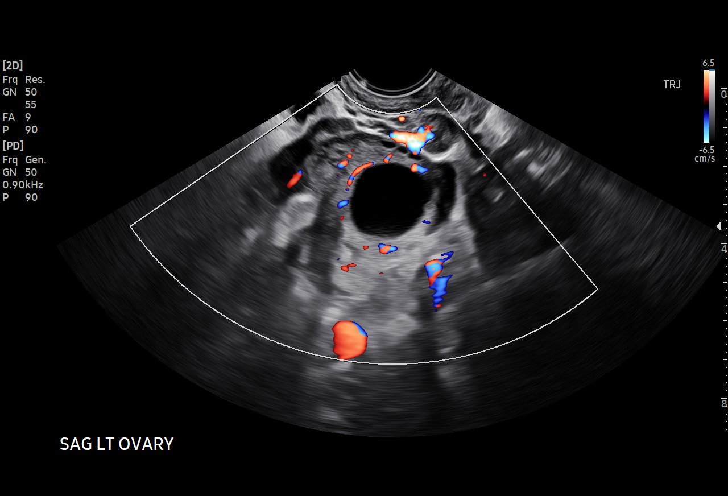
[im 38/45]
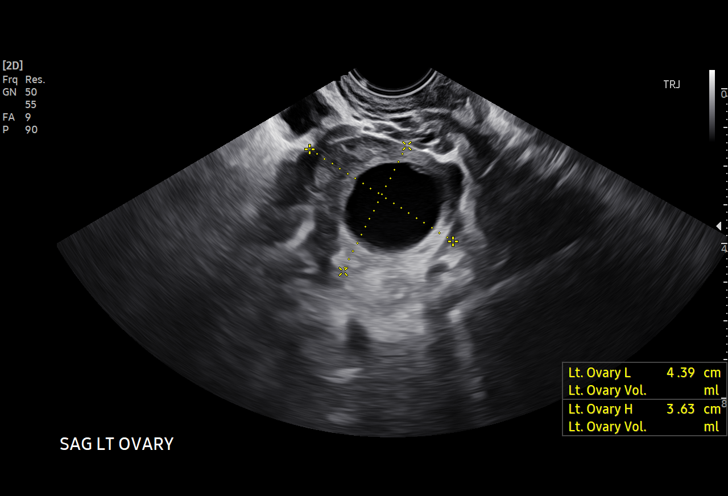
[im 41/45]
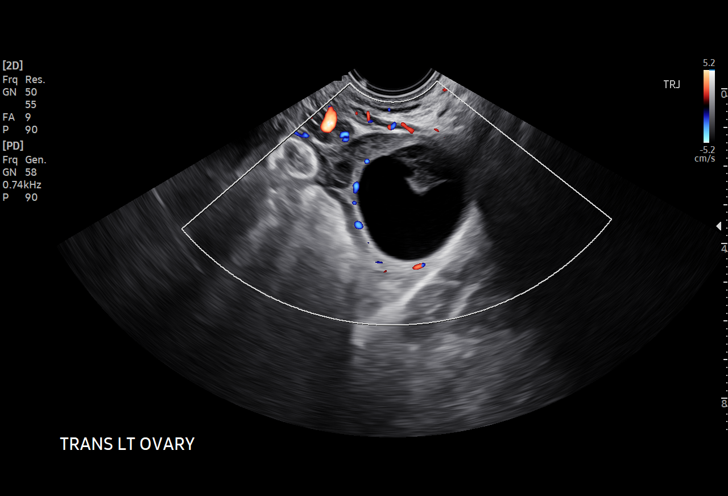
[im 45/45]
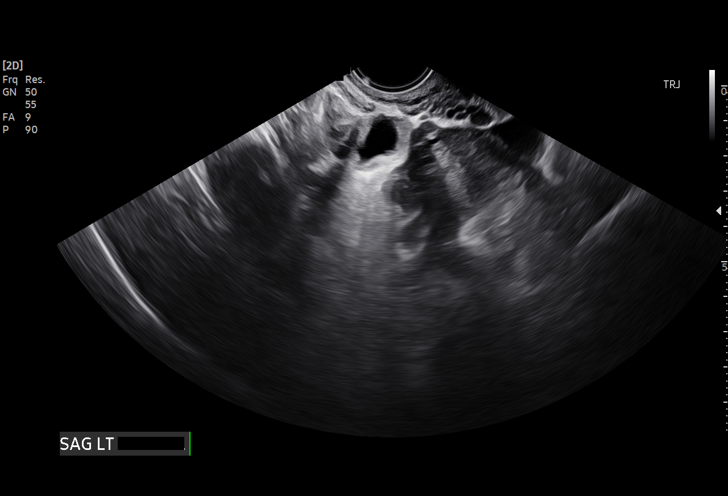

[15 of 28 positions shown; findings below may reference images not displayed]

FINDINGS: Intrauterine gestational sac: Single

Yolk sac:  Present

Embryo:  Present

Cardiac Activity: Present

Heart Rate: 84 bpm

CRL: 3.1 mm   5 w   6 d                  US EDC: 12/18/2020

Subchorionic hemorrhage:  None visualized.

Maternal uterus/adnexae: 2.6 cm probable fibroid posterior uterus.
IMPRESSION: 1. Single viable intrauterine pregnancy at 5 weeks 6 days. Fetal
heart rate 84 beats per minute for which follow-up is suggested.

2.  2.6 cm probable fibroid posterior uterus.

These results will be called to the ordering clinician or
representative by the Radiologist Assistant, and communication
documented in the PACS or [REDACTED].

## 2021-11-19 ENCOUNTER — Emergency Department (HOSPITAL_COMMUNITY)
Admission: EM | Admit: 2021-11-19 | Discharge: 2021-11-19 | Disposition: A | Discharge status: Home / Self Care | Payer: Medicaid Other | Attending: Emergency Medicine | Admitting: Emergency Medicine

## 2021-11-19 ENCOUNTER — Other Ambulatory Visit: Payer: Self-pay

## 2021-11-19 DIAGNOSIS — Y900 Blood alcohol level of less than 20 mg/100 ml: Secondary | ICD-10-CM | POA: Insufficient documentation

## 2021-11-19 DIAGNOSIS — Z20822 Contact with and (suspected) exposure to covid-19: Secondary | ICD-10-CM | POA: Diagnosis not present

## 2021-11-19 DIAGNOSIS — R5383 Other fatigue: Secondary | ICD-10-CM | POA: Diagnosis present

## 2021-11-19 DIAGNOSIS — R45851 Suicidal ideations: Secondary | ICD-10-CM | POA: Diagnosis not present

## 2021-11-19 DIAGNOSIS — R4589 Other symptoms and signs involving emotional state: Secondary | ICD-10-CM

## 2021-11-19 DIAGNOSIS — T50902A Poisoning by unspecified drugs, medicaments and biological substances, intentional self-harm, initial encounter: Secondary | ICD-10-CM

## 2021-11-19 DIAGNOSIS — F10129 Alcohol abuse with intoxication, unspecified: Secondary | ICD-10-CM | POA: Insufficient documentation

## 2021-11-19 LAB — RAPID URINE DRUG SCREEN, HOSP PERFORMED
Amphetamines: NOT DETECTED
Barbiturates: NOT DETECTED
Benzodiazepines: NOT DETECTED
Cocaine: NOT DETECTED
Opiates: NOT DETECTED
Tetrahydrocannabinol: POSITIVE — AB

## 2021-11-19 LAB — CBC
HCT: 38.9 % (ref 36.0–46.0)
Hemoglobin: 12.5 g/dL (ref 12.0–15.0)
MCH: 29.4 pg (ref 26.0–34.0)
MCHC: 32.1 g/dL (ref 30.0–36.0)
MCV: 91.5 fL (ref 80.0–100.0)
Platelets: 440 10*3/uL — ABNORMAL HIGH (ref 150–400)
RBC: 4.25 MIL/uL (ref 3.87–5.11)
RDW: 12.9 % (ref 11.5–15.5)
WBC: 7.3 10*3/uL (ref 4.0–10.5)
nRBC: 0 % (ref 0.0–0.2)

## 2021-11-19 LAB — ETHANOL: Alcohol, Ethyl (B): 20 mg/dL — ABNORMAL HIGH (ref <10)

## 2021-11-19 LAB — COMPREHENSIVE METABOLIC PANEL
ALT: 15 U/L (ref 0–44)
AST: 14 U/L — ABNORMAL LOW (ref 15–41)
Albumin: 3.7 g/dL (ref 3.5–5.0)
Alkaline Phosphatase: 98 U/L (ref 38–126)
Anion gap: 8 (ref 5–15)
BUN: 9 mg/dL (ref 6–20)
CO2: 25 mmol/L (ref 22–32)
Calcium: 9 mg/dL (ref 8.9–10.3)
Chloride: 109 mmol/L (ref 98–111)
Creatinine, Ser: 0.79 mg/dL (ref 0.44–1.00)
GFR, Estimated: >60 mL/min (ref >60)
Glucose, Bld: 88 mg/dL (ref 70–99)
Potassium: 3.6 mmol/L (ref 3.5–5.1)
Sodium: 142 mmol/L (ref 135–145)
Total Bilirubin: 0.4 mg/dL (ref 0.3–1.2)
Total Protein: 7.2 g/dL (ref 6.5–8.1)

## 2021-11-19 LAB — RESP PANEL BY RT-PCR (FLU A&B, COVID) ARPGX2
Influenza A by PCR: NEGATIVE
Influenza B by PCR: NEGATIVE
SARS Coronavirus 2 by RT PCR: NEGATIVE

## 2021-11-19 LAB — ACETAMINOPHEN LEVEL: Acetaminophen (Tylenol), Serum: <10 ug/mL — ABNORMAL LOW (ref 10–30)

## 2021-11-19 LAB — HCG, QUANTITATIVE, PREGNANCY: hCG, Beta Chain, Quant, S: <1 m[IU]/mL (ref <5)

## 2021-11-19 LAB — CBG MONITORING, ED: Glucose-Capillary: 93 mg/dL (ref 70–99)

## 2021-11-19 LAB — SALICYLATE LEVEL: Salicylate Lvl: <7 mg/dL — ABNORMAL LOW (ref 7.0–30.0)

## 2021-11-19 NOTE — Discharge Instructions (Signed)
Please drink plenty of fluids Recheck with your doctor this week Return if you are having any worsening symptoms You have been given behavioral health referrals.  Please follow-up with the behavioral health specialist

## 2021-11-19 NOTE — ED Notes (Signed)
PA at beside PT aware she need to change out

## 2021-11-19 NOTE — ED Notes (Signed)
PT have belongings to change. Sitter aware of discharge

## 2021-11-19 NOTE — ED Triage Notes (Signed)
Pt. Was sitting on the curb. She said 911 because " she felt like she did the wrong thing". She was drinking all day. She took promethazine liquid ( unknown amount). She said she has thoughts of harming herself and struggling with depression.

## 2021-11-19 NOTE — ED Notes (Signed)
Yellow shoes, purple top, black short, color bracelet, set of keys, one cell phone with pink case place at nurse desk

## 2021-11-19 NOTE — ED Provider Notes (Signed)
Emergency Medicine Observation Re-evaluation Note  Bethany Avery is a 21 y.o. female, seen on rounds today.  Pt initially presented to the ED for complaints of Alcohol Intoxication and Suicidal Currently, the patient is resting.  Physical Exam  BP 125/72   Pulse 72   Temp 97.8 F (36.6 C) (Oral)   Resp 15   SpO2 99%  Physical Exam General: wdwn Cardiac: rrr Lungs: cta Psych: awake and alert- no si, no psychosis, no hi  ED Course / MDM  EKG:EKG Interpretation  Date/Time:  Wednesday November 19 2021 04:24:05 EDT Ventricular Rate:  68 PR Interval:  197 QRS Duration: 97 QT Interval:  398 QTC Calculation: 424 R Axis:   77 Text Interpretation: Sinus rhythm Nonspecific T abnormalities, lateral leads Confirmed by Geoffery Lyons ([id number]) on 11/19/2021 4:36:44 AM  I have reviewed the labs performed to date as well as medications administered while in observation.  Recent changes in the last 24 hours include patient presented with reports of wishing to harm herself by taking an overdose of.  Promethazine and alcohol.  She has been watched and has remained hemodynamically stable and is medically cleared. She states that she feels that that episode was "in the moment".  She no longer feels suicidal.  She states that she does not feel like she would harm herself.  She wishes to be discharged.  She has a primary care doctor and is given referrals for outpatient follow-up.  Plan  Current plan is for .  Bethany Avery is not under involuntary commitment.     Margarita Grizzle, MD 11/19/21 [phone]
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
## 2021-11-21 ENCOUNTER — Encounter (HOSPITAL_COMMUNITY): Payer: Self-pay | Admitting: Emergency Medicine

## 2021-11-21 ENCOUNTER — Ambulatory Visit (HOSPITAL_COMMUNITY)
Admission: EM | Admit: 2021-11-21 | Discharge: 2021-11-21 | Disposition: A | Discharge status: Home / Self Care | Payer: Medicaid Other | Attending: Student | Admitting: Student

## 2021-11-21 DIAGNOSIS — Z3202 Encounter for pregnancy test, result negative: Secondary | ICD-10-CM | POA: Diagnosis present

## 2021-11-21 DIAGNOSIS — Z113 Encounter for screening for infections with a predominantly sexual mode of transmission: Secondary | ICD-10-CM | POA: Diagnosis not present

## 2021-11-21 LAB — POCT URINALYSIS DIPSTICK, ED / UC
Bilirubin Urine: NEGATIVE
Glucose, UA: NEGATIVE mg/dL
Hgb urine dipstick: NEGATIVE
Ketones, ur: NEGATIVE mg/dL
Leukocytes,Ua: NEGATIVE
Nitrite: NEGATIVE
Protein, ur: NEGATIVE mg/dL
Specific Gravity, Urine: 1.02 (ref 1.005–1.030)
Urobilinogen, UA: 1 mg/dL (ref 0.0–1.0)
pH: 7.5 (ref 5.0–8.0)

## 2021-11-21 LAB — POC URINE PREG, ED: Preg Test, Ur: NEGATIVE

## 2021-11-21 NOTE — ED Triage Notes (Signed)
Pt reports had a faint pregnancy test this morning and would like one today. Reports had spotty menstrual cycle in May and June.  Pt reports had some urinary frequency recently and would like STD testing.

## 2021-11-21 NOTE — ED Provider Notes (Signed)
[facility]    CSN: [id number] Arrival date & time: 11/21/21  1639      History   Chief Complaint Chief Complaint  Patient presents with   Urinary Frequency   Possible Pregnancy    HPI Bethany Avery is a 21 y.o. female presenting for pregnancy test following positive home test. History noncontributory. States she has had a faint menstrual period in both May and June, her last menstrual period was 11/05/2021.  She has also had some urinary frequency, but without hematuria, dysuria, urgency, incontinence.  Denies vaginal odor, vaginal discharge, vaginal irritation, vaginal rash or lesion.  Denies abdominal pain, flank pain, fever/chills. New female partner.   HPI  Past Medical History:  Diagnosis Date   ADHD (attention deficit hyperactivity disorder)    Anxiety    Cesarean delivery delivered 12/02/2020   Failure to progress. 5cm   Depression    Gestational diabetes    Gestational diabetes mellitus, class A2 09/28/2020   [facility] Guidelines for Antenatal Testing and Sonography                       Updated  08/12/2020 with Dr. Noralee Space  INDICATION Growth U/S BPP weekly DELIVERY RECOMMENDATION (GA)  Diabetes   A1 - good control     A2 - good control     A2  - poor control or poor compliance    (Macrosomia or polyhydramnios)     A2/B and B-C Pregestational Type II DM    Poor control B-C or D-R-F-T  or  Type I DM     Gestational hypertension 07/03/2020   Gonorrhea 02/02/2018   Headache    Headache in pregnancy, antepartum, second trimester 07/03/2020   Indication for care or intervention in labor or delivery 11/30/2020   Obesity in pregnancy, antepartum 05/31/2020   Positive GBS test 11/25/2020   Pregnant 11/30/2020   Prolonged rupture of membranes 12/02/2020   Supervision of high risk pregnancy, antepartum 05/31/2020    Nursing Staff Provider  Office Location  [facility] Dating  Ultrasound  Language  English Anatomy US  Normal, but limited. 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   Adjustment disorder with mixed anxiety and depressed mood 04/02/2020   Marijuana user 06/22/2018   Gonorrhea 02/02/2018   Adolescent behavior problem    Attention deficit hyperactivity disorder 08/24/2013    Past Surgical History:  Procedure Laterality Date   CESAREAN SECTION N/A 12/02/2020   Procedure: CESAREAN SECTION;  Surgeon: Venora Maples, MD;  Location: [facility];  Service: Obstetrics;  Laterality: N/A;    OB History     Gravida  2   Para  1   Term  1   Preterm      AB  1   Living  1      SAB  1   IAB      Ectopic      Multiple  0   Live Births  1  Home Medications    Prior to Admission medications   Medication Sig Start Date End Date Taking? Authorizing Provider  albuterol (VENTOLIN HFA) 108 (90 Base) MCG/ACT inhaler Inhale 2 puffs into the lungs as needed. 06/09/21  Yes [provider]  ibuprofen (ADVIL) 600 MG tablet Take 600 mg by mouth as needed. 02/14/21  Yes [provider]  naproxen (NAPROSYN) 500 MG tablet Take 500 mg by mouth in the morning and at bedtime. 07/02/21  Yes [provider]  cetirizine (ZYRTEC) 10 MG tablet Take 10 mg by mouth daily. 09/15/21   [provider]  escitalopram (LEXAPRO) 5 MG tablet Take 5 mg by mouth daily. 07/22/21   [provider]  fluticasone (FLONASE) 50 MCG/ACT nasal spray Place 1 spray into both nostrils daily. 10/17/21   [provider]  medroxyPROGESTERone (PROVERA) 10 MG tablet Take 1 tablet (10 mg total) by mouth daily for 10 days. 06/02/21 06/12/21  Zenia Resides, MD  valACYclovir (VALTREX) 500 MG  tablet Take 500 mg by mouth daily. 07/23/21   [provider]  metoCLOPramide (REGLAN) 10 MG tablet Take 1 tablet (10 mg total) by mouth every 8 (eight) hours as needed (take with tylenol for headaches). 07/03/20 12/01/20  Judeth Horn, NP    Family History Family History  Problem Relation Age of Onset   Healthy Mother    Healthy Father     Social History Social History   Tobacco Use   Smoking status: Never   Smokeless tobacco: Never  Vaping Use   Vaping Use: Never used  Substance Use Topics   Alcohol use: Not Currently   Drug use: Not Currently    Types: Marijuana    Comment: used this week     Allergies   Nickel   Review of Systems Review of Systems  Constitutional:  Negative for appetite change, chills, diaphoresis and fever.  Respiratory:  Negative for shortness of breath.   Cardiovascular:  Negative for chest pain.  Gastrointestinal:  Negative for abdominal pain, blood in stool, constipation, diarrhea, nausea and vomiting.  Genitourinary:  Positive for frequency. Negative for decreased urine volume, difficulty urinating, dysuria, flank pain, genital sores, hematuria and urgency.  Musculoskeletal:  Negative for back pain.  Neurological:  Negative for dizziness, weakness and light-headedness.  All other systems reviewed and are negative.    Physical Exam Triage Vital Signs ED Triage Vitals  Enc Vitals Group     BP 11/21/21 1715 130/73     Pulse Rate 11/21/21 1715 90     Resp 11/21/21 1715 16     Temp 11/21/21 1715 98.3 F (36.8 C)     Temp Source 11/21/21 1715 Oral     SpO2 11/21/21 1715 100 %     Weight --      Height --      Head Circumference --      Peak Flow --      Pain Score 11/21/21 1712 0     Pain Loc --      Pain Edu? --      Excl. in GC? --    No data found.  Updated Vital Signs BP 130/73 (BP Location: Left Arm)   Pulse 90   Temp 98.3 F (36.8 C) (Oral)   Resp 16   LMP 11/05/2021   SpO2 100%   Visual Acuity Right Eye  Distance:   Left Eye Distance:   Bilateral Distance:    Right Eye Near:   Left Eye Near:  Bilateral Near:     Physical Exam Vitals reviewed.  Constitutional:      General: She is not in acute distress.    Appearance: Normal appearance. She is not ill-appearing.  HENT:     Head: Normocephalic and atraumatic.     Mouth/Throat:     Mouth: Mucous membranes are moist.     Comments: Moist mucous membranes Eyes:     Extraocular Movements: Extraocular movements intact.     Pupils: Pupils are equal, round, and reactive to light.  Cardiovascular:     Rate and Rhythm: Normal rate and regular rhythm.     Heart sounds: Normal heart sounds.  Pulmonary:     Effort: Pulmonary effort is normal.     Breath sounds: Normal breath sounds. No wheezing, rhonchi or rales.  Abdominal:     General: Bowel sounds are normal. There is no distension.     Palpations: Abdomen is soft. There is no mass.     Tenderness: There is no abdominal tenderness. There is no right CVA tenderness, left CVA tenderness, guarding or rebound.  Skin:    General: Skin is warm.     Capillary Refill: Capillary refill takes less than 2 seconds.     Comments: Good skin turgor  Neurological:     General: No focal deficit present.     Mental Status: She is alert and oriented to person, place, and time.  Psychiatric:        Mood and Affect: Mood normal.        Behavior: Behavior normal.      UC Treatments / Results  Labs (all labs ordered are listed, but only abnormal results are displayed) Labs Reviewed  POC URINE PREG, ED  POCT URINALYSIS DIPSTICK, ED / UC  CERVICOVAGINAL ANCILLARY ONLY    EKG   Radiology No results found.  Procedures Procedures (including critical care time)  Medications Ordered in UC Medications - No data to display  Initial Impression / Assessment and Plan / UC Course  I have reviewed the triage vital signs and the nursing notes.  Pertinent labs & imaging results that were  available during my care of the patient were reviewed by me and considered in my medical decision making (see chart for details).     This patient is a very pleasant 21 y.o. year old female presenting for pregnancy test. Negative. Afebrile, nontachycardic, no reproducible abd pain or CVAT.  UA - wnl.  U-preg negative.  Results discussed with patient.   ED return precautions discussed. Patient verbalizes understanding and agreement.   Final Clinical Impressions(s) / UC Diagnoses   Final diagnoses:  Negative pregnancy test  Routine screening for STI (sexually transmitted infection)     Discharge Instructions      -Your pregnancy test was negative. -You can check another home pregnancy test in 1 week. It can take 2 weeks following conception to test positive.  -We have sent testing for sexually transmitted infections. We will notify you of any positive results once they are received. If required, we will prescribe any medications you might need. Please refrain from all sexual activity until treatment is complete.  -Seek additional medical attention if you develop fevers/chills, new/worsening abdominal pain, new/worsening vaginal discomfort/discharge, etc.     ED Prescriptions   None    PDMP not reviewed this encounter.   Rhys Martini, PA-C 11/21/21 [place]

## 2021-11-21 NOTE — Discharge Instructions (Addendum)
-  Your pregnancy test was negative. -You can check another home pregnancy test in 1 week. It can take 2 weeks following conception to test positive.  -We have sent testing for sexually transmitted infections. We will notify you of any positive results once they are received. If required, we will prescribe any medications you might need. Please refrain from all sexual activity until treatment is complete.  -Seek additional medical attention if you develop fevers/chills, new/worsening abdominal pain, new/worsening vaginal discomfort/discharge, etc.

## 2021-11-24 ENCOUNTER — Telehealth (HOSPITAL_COMMUNITY): Payer: Self-pay | Admitting: Emergency Medicine

## 2021-11-24 LAB — CERVICOVAGINAL ANCILLARY ONLY
Bacterial Vaginitis (gardnerella): POSITIVE — AB
Candida Glabrata: NEGATIVE
Candida Vaginitis: POSITIVE — AB
Chlamydia: NEGATIVE
Comment: NEGATIVE
Comment: NEGATIVE
Comment: NEGATIVE
Comment: NEGATIVE
Comment: NEGATIVE
Comment: NORMAL
Neisseria Gonorrhea: NEGATIVE
Trichomonas: NEGATIVE

## 2021-11-24 MED ORDER — FLUCONAZOLE 150 MG PO TABS: 150.0000 mg | ORAL_TABLET | Freq: Once | ORAL | 0 refills | Status: AC

## 2021-11-24 MED ORDER — METRONIDAZOLE 500 MG PO TABS: 500.0000 mg | ORAL_TABLET | Freq: Two times a day (BID) | ORAL | 0 refills | Status: DC

## 2021-12-11 ENCOUNTER — Ambulatory Visit (HOSPITAL_COMMUNITY)
Admission: EM | Admit: 2021-12-11 | Discharge: 2021-12-11 | Disposition: A | Discharge status: Home / Self Care | Payer: Medicaid Other

## 2022-01-30 ENCOUNTER — Encounter (HOSPITAL_COMMUNITY): Payer: Self-pay | Admitting: Emergency Medicine

## 2022-01-30 ENCOUNTER — Ambulatory Visit (HOSPITAL_COMMUNITY)
Admission: EM | Admit: 2022-01-30 | Discharge: 2022-01-30 | Disposition: A | Discharge status: Home / Self Care | Payer: Medicaid Other | Attending: Family Medicine | Admitting: Family Medicine

## 2022-01-30 DIAGNOSIS — Z113 Encounter for screening for infections with a predominantly sexual mode of transmission: Secondary | ICD-10-CM | POA: Insufficient documentation

## 2022-01-30 DIAGNOSIS — Z202 Contact with and (suspected) exposure to infections with a predominantly sexual mode of transmission: Secondary | ICD-10-CM

## 2022-01-30 DIAGNOSIS — N912 Amenorrhea, unspecified: Secondary | ICD-10-CM | POA: Insufficient documentation

## 2022-01-30 DIAGNOSIS — Z20822 Contact with and (suspected) exposure to covid-19: Secondary | ICD-10-CM | POA: Insufficient documentation

## 2022-01-30 LAB — HEPATITIS C ANTIBODY: HCV Ab: NONREACTIVE

## 2022-01-30 LAB — SARS CORONAVIRUS 2 BY RT PCR: SARS Coronavirus 2 by RT PCR: NEGATIVE

## 2022-01-30 LAB — HIV ANTIBODY (ROUTINE TESTING W REFLEX): HIV Screen 4th Generation wRfx: NONREACTIVE

## 2022-01-30 LAB — POC URINE PREG, ED: Preg Test, Ur: NEGATIVE

## 2022-01-30 MED ORDER — METRONIDAZOLE 500 MG PO TABS: 500.0000 mg | ORAL_TABLET | Freq: Two times a day (BID) | ORAL | 0 refills | Status: DC

## 2022-01-30 NOTE — Discharge Instructions (Addendum)
You were seen today for exposure to trichomonas.  I have treated for this with oral flagyl twice/day x 7 days.  Your swabs and lab work will be resulted by tomorrow and will be visible on mychart.  If you test positive for anything else we will call and notify you to treat.  Your covid swab will be resulted by this evening.  You will be called if positive as well.  Your pregnancy test was negative.

## 2022-01-30 NOTE — ED Provider Notes (Signed)
[facility]    CSN: [id number] Arrival date & time: 01/30/22  1232      History   Chief Complaint Chief Complaint  Patient presents with   Covid Exposure   Exposure to STD    HPI Bethany Avery is a 21 y.o. female.   Patient is here for STD testing.  Was told she was exposed to trich.  She is having some abdominal pain and lower back pain.  Some pain during intercourse.  No urinary symptoms noted.  No fevers/chills.  Slight vaginal d/c, not itching.  She would also like blood work for std testing.   She would like pregnancy test.  Several days late;  not currently on birth control.   She would like covid test.  She states she was exposed; no current symptoms.        Past Medical History:  Diagnosis Date   ADHD (attention deficit hyperactivity disorder)    Anxiety    Cesarean delivery delivered 12/02/2020   Failure to progress. 5cm   Depression    Gestational diabetes    Gestational diabetes mellitus, class A2 09/28/2020   [facility] Guidelines for Antenatal Testing and Sonography                       Updated  08/12/2020 with Dr. Noralee Space  INDICATION Growth U/S BPP weekly DELIVERY RECOMMENDATION (GA)  Diabetes   A1 - good control     A2 - good control     A2  - poor control or poor compliance    (Macrosomia or polyhydramnios)     A2/B and B-C Pregestational Type II DM    Poor control B-C or D-R-F-T  or  Type I DM     Gestational hypertension 07/03/2020   Gonorrhea 02/02/2018   Headache    Headache in pregnancy, antepartum, second trimester 07/03/2020   Indication for care or intervention in labor or delivery 11/30/2020   Obesity in pregnancy, antepartum 05/31/2020   Positive GBS test 11/25/2020   Pregnant 11/30/2020   Prolonged rupture of membranes 12/02/2020   Supervision of high risk pregnancy, antepartum 05/31/2020    Nursing Staff Provider  Office Location  [facility] Dating  Ultrasound  Language  English Anatomy US  Normal, but limited. 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/03/2020   Adjustment disorder with mixed anxiety and depressed mood 04/02/2020   Marijuana user 06/22/2018   Gonorrhea 02/02/2018   Adolescent behavior problem    Attention deficit hyperactivity disorder 08/24/2013    Past Surgical History:  Procedure Laterality Date   CESAREAN SECTION N/A 12/02/2020   Procedure: CESAREAN SECTION;  Surgeon: Venora Maples, MD;  Location: [facility];  Service: Obstetrics;  Laterality: N/A;    OB History     Gravida  2   Para  1   Term  1   Preterm      AB  1   Living  1  SAB  1   IAB      Ectopic      Multiple  0   Live Births  1            Home Medications    Prior to Admission medications   Medication Sig Start Date End Date Taking? Authorizing Provider  albuterol (VENTOLIN HFA) 108 (90 Base) MCG/ACT inhaler Inhale 2 puffs into the lungs as needed. 06/09/21   [provider]  cetirizine (ZYRTEC) 10 MG tablet Take 10 mg by mouth daily. 09/15/21   [provider]  escitalopram (LEXAPRO) 5 MG tablet Take 5 mg by mouth daily. 07/22/21   [provider]  fluticasone (FLONASE) 50 MCG/ACT nasal spray Place 1 spray into both nostrils daily. 10/17/21   [provider]  hydrOXYzine (ATARAX) 25 MG tablet Take 25 mg by mouth 3 (three) times daily. 01/16/22   [provider]  ibuprofen (ADVIL) 600 MG tablet Take 600 mg by mouth as needed. 02/14/21   [provider]  medroxyPROGESTERone (PROVERA) 10 MG tablet Take 1 tablet (10 mg total) by mouth daily for 10 days. 06/02/21 06/12/21  Zenia Resides, MD  metroNIDAZOLE (FLAGYL) 500 MG  tablet Take 1 tablet (500 mg total) by mouth 2 (two) times daily. 11/24/21   Lamptey, Britta Mccreedy, MD  naproxen (NAPROSYN) 500 MG tablet Take 500 mg by mouth in the morning and at bedtime. 07/02/21   [provider]  valACYclovir (VALTREX) 500 MG tablet Take 500 mg by mouth daily. 07/23/21   [provider]  metoCLOPramide (REGLAN) 10 MG tablet Take 1 tablet (10 mg total) by mouth every 8 (eight) hours as needed (take with tylenol for headaches). 07/03/20 12/01/20  Judeth Horn, NP    Family History Family History  Problem Relation Age of Onset   Healthy Mother    Healthy Father     Social History Social History   Tobacco Use   Smoking status: Never   Smokeless tobacco: Never  Vaping Use   Vaping Use: Never used  Substance Use Topics   Alcohol use: Not Currently   Drug use: Not Currently    Types: Marijuana    Comment: used this week     Allergies   Nickel   Review of Systems Review of Systems  Constitutional: Negative.   HENT: Negative.    Respiratory: Negative.    Cardiovascular: Negative.   Gastrointestinal:  Positive for abdominal pain.  Genitourinary:  Positive for vaginal discharge.     Physical Exam Triage Vital Signs ED Triage Vitals  Enc Vitals Group     BP 01/30/22 1348 114/69     Pulse Rate 01/30/22 1348 90     Resp 01/30/22 1348 16     Temp 01/30/22 1348 98.9 F (37.2 C)     Temp Source 01/30/22 1348 Oral     SpO2 01/30/22 1348 99 %     Weight --      Height --      Head Circumference --      Peak Flow --      Pain Score 01/30/22 1344 5     Pain Loc --      Pain Edu? --      Excl. in GC? --    No data found.  Updated Vital Signs BP 114/69 (BP Location: Left Arm)   Pulse 90   Temp 98.9 F (37.2 C) (Oral)   Resp 16   LMP 01/04/2022   SpO2  99%   Visual Acuity Right Eye Distance:   Left Eye Distance:   Bilateral Distance:    Right Eye Near:   Left Eye Near:    Bilateral Near:     Physical Exam Constitutional:       Appearance: Normal appearance.  Cardiovascular:     Rate and Rhythm: Normal rate and regular rhythm.  Pulmonary:     Effort: Pulmonary effort is normal.  Skin:    General: Skin is warm.  Neurological:     General: No focal deficit present.     Mental Status: She is alert.  Psychiatric:        Mood and Affect: Mood normal.      UC Treatments / Results  Labs (all labs ordered are listed, but only abnormal results are displayed) Labs Reviewed  SARS CORONAVIRUS 2 BY RT PCR  HIV ANTIBODY (ROUTINE TESTING W REFLEX)  RPR  HEPATITIS C ANTIBODY  POC URINE PREG, ED  CERVICOVAGINAL ANCILLARY ONLY    EKG   Radiology No results found.  Procedures Procedures (including critical care time)  Medications Ordered in UC Medications - No data to display  Initial Impression / Assessment and Plan / UC Course  I have reviewed the triage vital signs and the nursing notes.  Pertinent labs & imaging results that were available during my care of the patient were reviewed by me and considered in my medical decision making (see chart for details).    Final Clinical Impressions(s) / UC Diagnoses   Final diagnoses:  STD exposure  Screen for STD (sexually transmitted disease)  Close exposure to COVID-19 virus  Amenorrhea     Discharge Instructions      You were seen today for exposure to trichomonas.  I have treated for this with oral flagyl twice/day x 7 days.  Your swabs and lab work will be resulted by tomorrow and will be visible on mychart.  If you test positive for anything else we will call and notify you to treat.  Your covid swab will be resulted by this evening.  You will be called if positive as well.  Your pregnancy test was negative.      ED Prescriptions     Medication Sig Dispense Auth. Provider   metroNIDAZOLE (FLAGYL) 500 MG tablet Take 1 tablet (500 mg total) by mouth 2 (two) times daily. 14 tablet Jannifer Franklin, MD      PDMP not reviewed this encounter.    Jannifer Franklin, MD 01/30/22 1453

## 2022-01-30 NOTE — ED Triage Notes (Signed)
Pt reports that went to visit her mother and was exposed to covid so wanting a test. Denies symptoms.  Pt c/o abd pains and lower back pains that has been intermittent for 2 days.   Needing a pregnancy test due to period being late couple days. Hasnt taken birth control in a month.  Pt reports a sexual partner that she was with had recent positive for Trich so wanting STD testing. Reports some vaginal discharge for about 2 days. Reports had pain when had intercourse.

## 2022-01-31 LAB — RPR: RPR Ser Ql: NONREACTIVE

## 2022-02-02 ENCOUNTER — Telehealth (HOSPITAL_COMMUNITY): Payer: Self-pay | Admitting: Emergency Medicine

## 2022-02-02 LAB — CERVICOVAGINAL ANCILLARY ONLY
Bacterial Vaginitis (gardnerella): NEGATIVE
Candida Glabrata: NEGATIVE
Candida Vaginitis: POSITIVE — AB
Chlamydia: NEGATIVE
Comment: NEGATIVE
Comment: NEGATIVE
Comment: NEGATIVE
Comment: NEGATIVE
Comment: NEGATIVE
Comment: NORMAL
Neisseria Gonorrhea: NEGATIVE
Trichomonas: NEGATIVE

## 2022-02-02 MED ORDER — FLUCONAZOLE 150 MG PO TABS: 150.0000 mg | ORAL_TABLET | Freq: Once | ORAL | 0 refills | Status: AC

## 2022-03-09 IMAGING — US US FETAL BPP W/ NON-STRESS
1 series · 11 of 11 positions shown · non-contrast
Comparison: none

[Series 1: us fetal bpp w/ non-stress · 11 acquisitions, 11 frames shown]
[im 1/11]
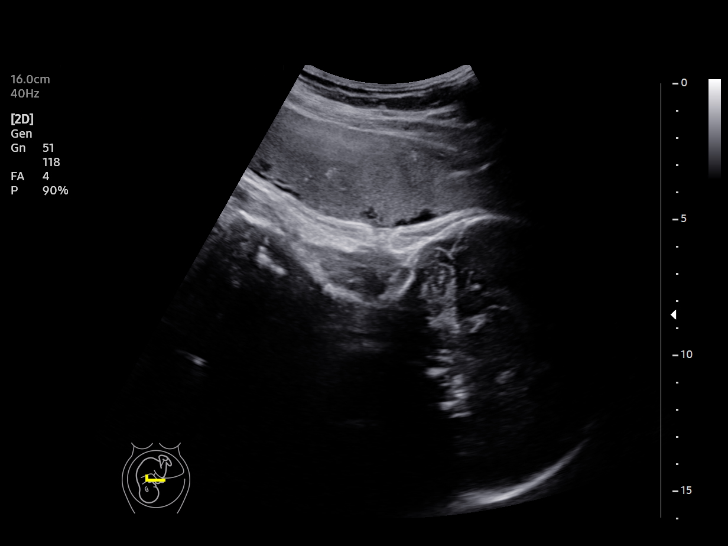
[im 2/11]
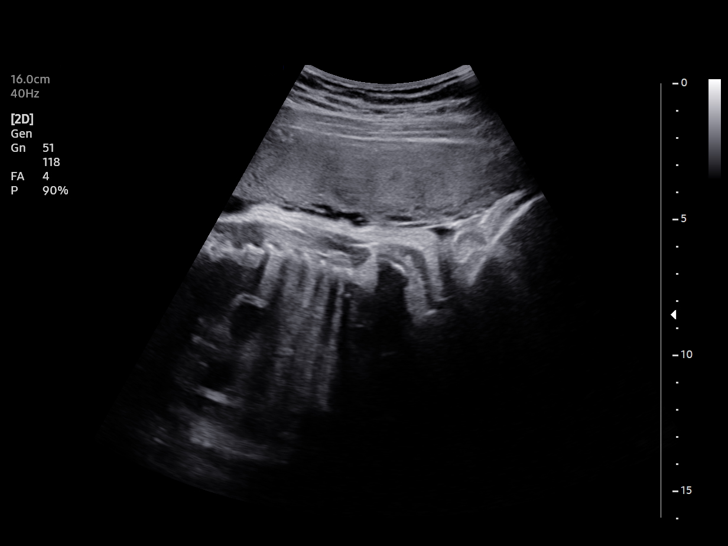
[im 3/11]
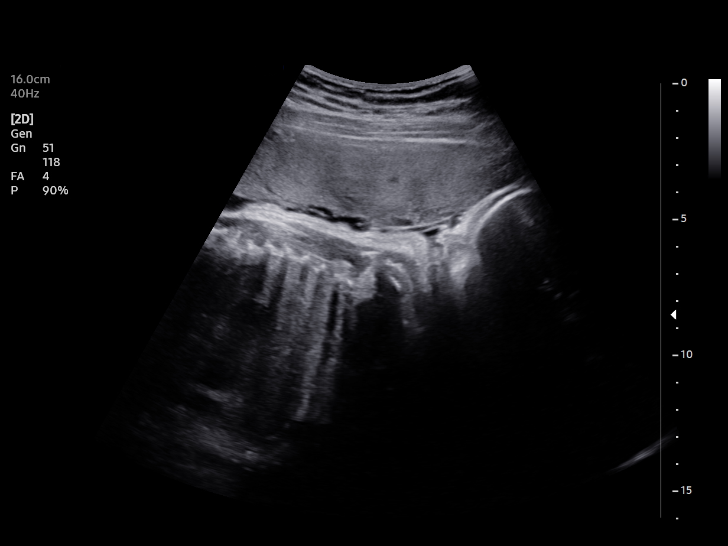
[im 4/11]
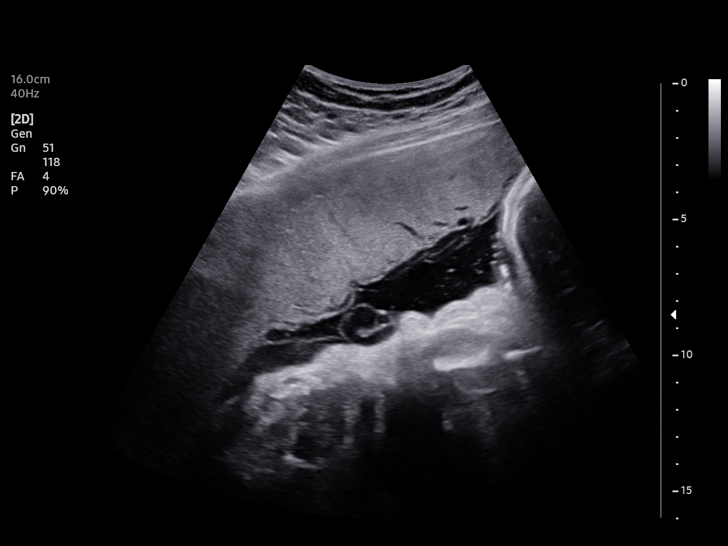
[im 5/11]
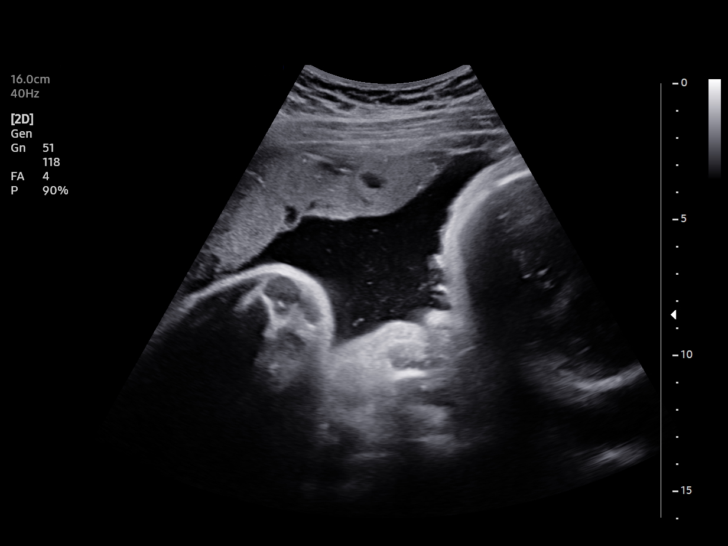
[im 6/11]
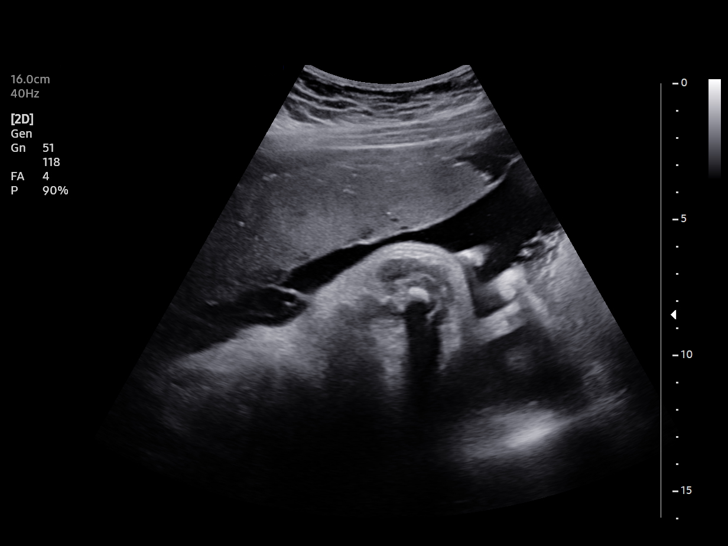
[im 7/11]
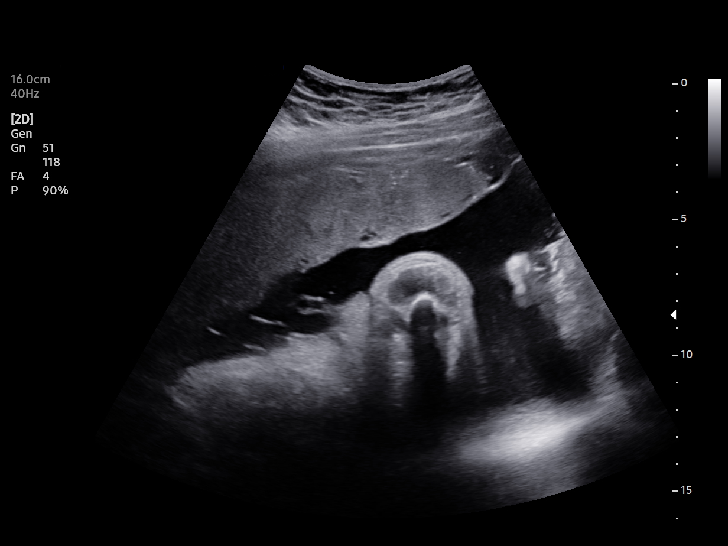
[im 8/11]
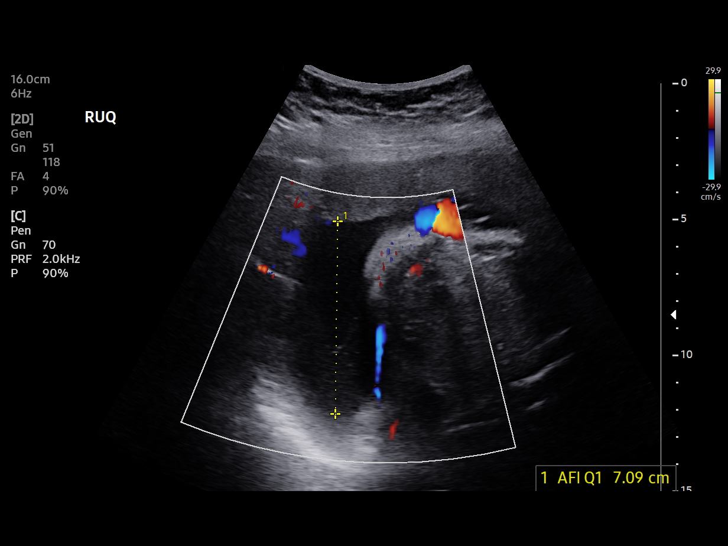
[im 9/11]
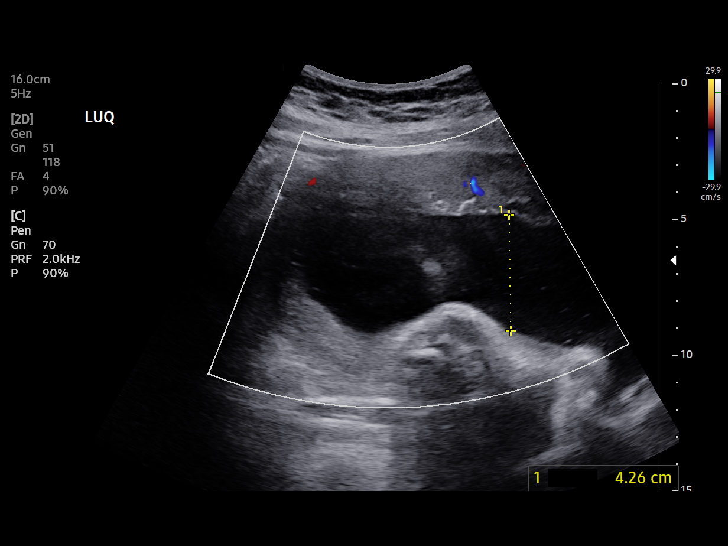
[im 10/11]
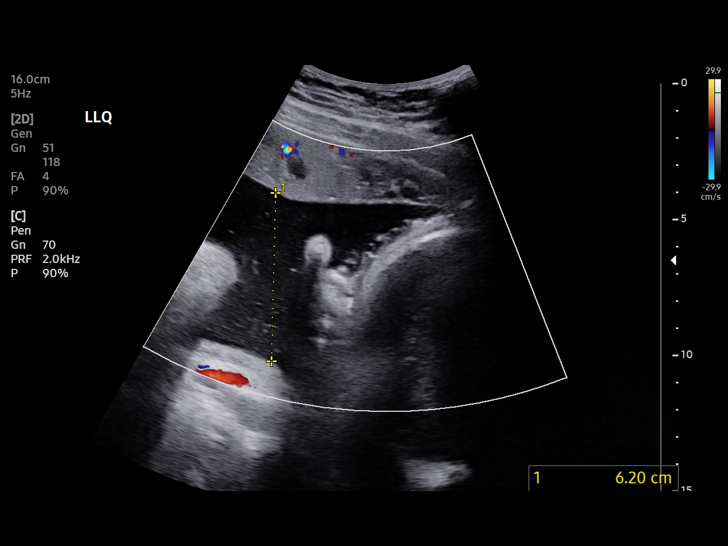
[im 11/11]
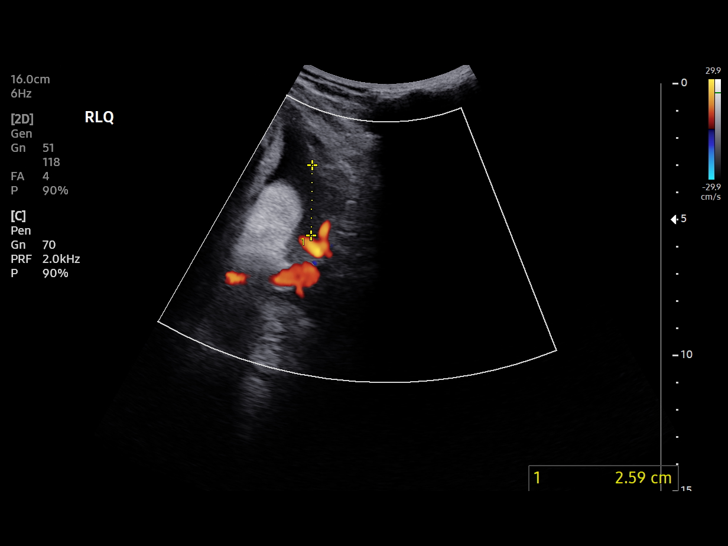

[11 of 11 positions shown; findings below may reference images not displayed]

Healthcare at

 1  US FETAL BPP W/NONSTRESS              76818.4     [facility]

Indications

 36 weeks gestation of pregnancy
 Gestational diabetes in pregnancy,
 controlled by oral hypoglycemic drugs
Fetal Evaluation

 Num Of Fetuses:         1
 Preg. Location:         Intrauterine
 Cardiac Activity:       Observed
 Fetal Lie:              Maternal right side
 Presentation:           Cephalic

 Amniotic Fluid
 AFI FV:      Within normal limits

 AFI Sum(cm)     %Tile       Largest Pocket(cm)
 20.14           76

 RUQ(cm)       RLQ(cm)       LUQ(cm)        LLQ(cm)


 Comment:    Breathing noted intermittently, but not sustained.
Biophysical Evaluation

 Amniotic F.V:   Pocket => 2 cm             F. Tone:        Observed
 F. Movement:    Observed                   N.S.T:          Reactive
 F. Breathing:   Not Observed               Score:          [DATE]
OB History
 Gravidity:    2         Term:   0        Prem:   0        SAB:   1
 TOP:          0       Ectopic:  0        Living: 0
Gestational Age

 LMP:           36w 6d        Date:  03/07/20                 EDD:   12/12/20
 Best:          36w 0d     Det. By:  Early Ultrasound         EDD:   12/18/20
                                     (04/23/20)
Impression

 BPP [DATE] with reactive NST
Recommendations

 -Continue weekly BPP till delivery.
                  Trotta, Robel

## 2022-03-13 ENCOUNTER — Other Ambulatory Visit: Payer: Self-pay

## 2022-03-13 ENCOUNTER — Encounter (HOSPITAL_BASED_OUTPATIENT_CLINIC_OR_DEPARTMENT_OTHER): Payer: Self-pay | Admitting: Emergency Medicine

## 2022-03-13 ENCOUNTER — Other Ambulatory Visit (HOSPITAL_BASED_OUTPATIENT_CLINIC_OR_DEPARTMENT_OTHER): Payer: Self-pay

## 2022-03-13 ENCOUNTER — Emergency Department (HOSPITAL_BASED_OUTPATIENT_CLINIC_OR_DEPARTMENT_OTHER)
Admission: EM | Admit: 2022-03-13 | Discharge: 2022-03-13 | Disposition: A | Discharge status: Home / Self Care | Payer: Medicaid Other | Attending: Emergency Medicine | Admitting: Emergency Medicine

## 2022-03-13 DIAGNOSIS — Z202 Contact with and (suspected) exposure to infections with a predominantly sexual mode of transmission: Secondary | ICD-10-CM | POA: Diagnosis not present

## 2022-03-13 DIAGNOSIS — H5712 Ocular pain, left eye: Secondary | ICD-10-CM | POA: Diagnosis present

## 2022-03-13 DIAGNOSIS — H1032 Unspecified acute conjunctivitis, left eye: Secondary | ICD-10-CM | POA: Diagnosis not present

## 2022-03-13 LAB — WET PREP, GENITAL
Clue Cells Wet Prep HPF POC: NONE SEEN
Sperm: NONE SEEN
Trich, Wet Prep: NONE SEEN
WBC, Wet Prep HPF POC: <10 (ref <10)
Yeast Wet Prep HPF POC: NONE SEEN

## 2022-03-13 LAB — URINALYSIS, ROUTINE W REFLEX MICROSCOPIC
Bilirubin Urine: NEGATIVE
Glucose, UA: NEGATIVE mg/dL
Ketones, ur: NEGATIVE mg/dL
Leukocytes,Ua: NEGATIVE
Nitrite: NEGATIVE
Protein, ur: 30 mg/dL — AB
Specific Gravity, Urine: 1.02 (ref 1.005–1.030)
pH: 6.5 (ref 5.0–8.0)

## 2022-03-13 LAB — URINALYSIS, MICROSCOPIC (REFLEX)

## 2022-03-13 LAB — PREGNANCY, URINE: Preg Test, Ur: NEGATIVE

## 2022-03-13 MED ORDER — CEFTRIAXONE SODIUM 500 MG IJ SOLR
500.0000 mg | Freq: Once | INTRAMUSCULAR | Status: AC
  Administered 2022-03-13: 500 mg via INTRAMUSCULAR
  Filled 2022-03-13: qty 500

## 2022-03-13 MED ORDER — DOXYCYCLINE HYCLATE 100 MG PO TABS
100.0000 mg | ORAL_TABLET | Freq: Once | ORAL | Status: AC
  Administered 2022-03-13: 100 mg via ORAL
  Filled 2022-03-13: qty 1

## 2022-03-13 MED ORDER — ERYTHROMYCIN 5 MG/GM OP OINT
TOPICAL_OINTMENT | Freq: Four times a day (QID) | OPHTHALMIC | Status: DC
  Administered 2022-03-13: 1 via OPHTHALMIC
  Filled 2022-03-13: qty 3.5

## 2022-03-13 MED ORDER — METRONIDAZOLE 500 MG PO TABS
2000.0000 mg | ORAL_TABLET | Freq: Once | ORAL | Status: AC
  Administered 2022-03-13: 2000 mg via ORAL
  Filled 2022-03-13: qty 4

## 2022-03-13 MED ORDER — DOXYCYCLINE HYCLATE 100 MG PO CAPS
100.0000 mg | ORAL_CAPSULE | Freq: Two times a day (BID) | ORAL | 0 refills | Status: AC
  Filled 2022-03-13: qty 14, 7d supply, fill #0

## 2022-03-13 NOTE — ED Provider Notes (Addendum)
[facility] EMERGENCY DEPARTMENT Provider Note   CSN: [id number] Arrival date & time: 03/13/22  1113     History  Chief Complaint  Patient presents with   Exposure to STD    Bethany Avery is a 21 y.o. female.  Patient here for treatment for trichomonas.  She is also has some left eye irritation.  She states partner tested positive for trichomonas.  She states that she rubbed her eye here recently.  She has had some redness to her eye.  Denies any symptoms otherwise.  No vision loss.  No vaginal discharge or bleeding.  Nothing makes it worse or better.  She is here for treatment.  The history is provided by the patient.       Home Medications Prior to Admission medications   Medication Sig Start Date End Date Taking? Authorizing Provider  doxycycline (VIBRAMYCIN) 100 MG capsule Take 1 capsule (100 mg total) by mouth 2 (two) times daily for 7 days. 03/13/22 03/20/22 Yes Thanos Cousineau, DO  albuterol (VENTOLIN HFA) 108 (90 Base) MCG/ACT inhaler Inhale 2 puffs into the lungs as needed. 06/09/21   [provider]  cetirizine (ZYRTEC) 10 MG tablet Take 10 mg by mouth daily. 09/15/21   [provider]  escitalopram (LEXAPRO) 5 MG tablet Take 5 mg by mouth daily. 07/22/21   [provider]  fluticasone (FLONASE) 50 MCG/ACT nasal spray Place 1 spray into both nostrils daily. 10/17/21   [provider]  hydrOXYzine (ATARAX) 25 MG tablet Take 25 mg by mouth 3 (three) times daily. 01/16/22   [provider]  ibuprofen (ADVIL) 600 MG tablet Take 600 mg by mouth as needed. 02/14/21   [provider]  metroNIDAZOLE (FLAGYL) 500 MG tablet Take 1 tablet (500 mg total) by mouth 2 (two) times daily. 01/30/22   Piontek, Denny Peon, MD  valACYclovir (VALTREX) 500 MG tablet Take 500 mg by mouth daily. 07/23/21   [provider]  metoCLOPramide (REGLAN) 10 MG tablet Take 1 tablet (10 mg total) by mouth every 8 (eight) hours as needed  (take with tylenol for headaches). 07/03/20 12/01/20  Judeth Horn, NP      Allergies    Nickel    Review of Systems   Review of Systems  Physical Exam Updated Vital Signs BP 122/78   Pulse 96   Temp 99.1 F (37.3 C) (Oral)   Resp 18   Ht 5\' 5"  (1.651 m)   Wt 99.8 kg   LMP 03/09/2022   SpO2 (!) 88%   BMI 36.61 kg/m  Physical Exam Vitals and nursing note reviewed.  Constitutional:      General: She is not in acute distress.    Appearance: She is well-developed. She is not ill-appearing.  HENT:     Head: Normocephalic and atraumatic.  Eyes:     Extraocular Movements: Extraocular movements intact.     Conjunctiva/sclera: Conjunctivae normal.     Pupils: Pupils are equal, round, and reactive to light.     Comments: Conjunctiva mildly inflamed on the left, there is no purulent drainage or surrounding cellulitis  Cardiovascular:     Rate and Rhythm: Normal rate and regular rhythm.  Abdominal:     Palpations: Abdomen is soft.     Tenderness: There is no abdominal tenderness.  Musculoskeletal:        General: No swelling.     Cervical back: Normal range of motion and neck supple.  Skin:    General: Skin is warm  and dry.  Neurological:     Mental Status: She is alert.  Psychiatric:        Mood and Affect: Mood normal.     ED Results / Procedures / Treatments   Labs (all labs ordered are listed, but only abnormal results are displayed) Labs Reviewed  WET PREP, GENITAL  PREGNANCY, URINE  URINALYSIS, ROUTINE W REFLEX MICROSCOPIC  GC/CHLAMYDIA PROBE AMP ([place]) NOT AT [facility]    EKG None  Radiology No results found.  Procedures Procedures    Medications Ordered in ED Medications  erythromycin ophthalmic ointment (1 Application Left Eye Given 03/13/22 1154)  metroNIDAZOLE (FLAGYL) tablet 2,000 mg (2,000 mg Oral Given 03/13/22 1153)  doxycycline (VIBRA-TABS) tablet 100 mg (100 mg Oral Given 03/13/22 1153)  cefTRIAXone (ROCEPHIN) injection 500 mg (500 mg  Intramuscular Given 03/13/22 1153)    ED Course/ Medical Decision Making/ A&P                           Medical Decision Making Amount and/or Complexity of Data Reviewed Labs: ordered.  Risk Prescription drug management.   Bethany Avery is here for treatment for trichomonas.  She is also has some irritation of her left eye last several days.  Patient with a little bit of irritation to the left eye conjunctivo-.  Could be pinkeye.  Will treat with erythromycin.  Symptoms are not bilateral and seems less likely to be STD related.  We will empirically treat for gonorrhea, chlamydia, trichomonas.  She has normal vitals.  She understands to return if symptoms from the eye do not improve.  Pregnancy test is negative.  Patient discharged in good condition.  This chart was dictated using voice recognition software.  Despite best efforts to proofread,  errors can occur which can change the documentation meaning.         Final Clinical Impression(s) / ED Diagnoses Final diagnoses:  STD exposure  Acute conjunctivitis of left eye, unspecified acute conjunctivitis type    Rx / DC Orders ED Discharge Orders          Ordered    doxycycline (VIBRAMYCIN) 100 MG capsule  2 times daily        03/13/22 [place], Quita Skye, DO 03/13/22 [place], [place], DO 03/13/22 1214

## 2022-03-13 NOTE — ED Triage Notes (Signed)
Pt arrives pov, steady gait, endorses partner tested positive for Trich, requests STD testing, denies symptoms. Also c/o LT eye pain and itching and redness

## 2022-03-16 LAB — GC/CHLAMYDIA PROBE AMP (~~LOC~~) NOT AT ARMC
Chlamydia: NEGATIVE
Comment: NEGATIVE
Comment: NORMAL
Neisseria Gonorrhea: NEGATIVE

## 2022-03-20 ENCOUNTER — Other Ambulatory Visit (HOSPITAL_BASED_OUTPATIENT_CLINIC_OR_DEPARTMENT_OTHER): Payer: Self-pay

## 2022-04-19 ENCOUNTER — Other Ambulatory Visit: Payer: Self-pay

## 2022-04-19 ENCOUNTER — Encounter (HOSPITAL_COMMUNITY): Payer: Self-pay

## 2022-04-19 ENCOUNTER — Emergency Department (HOSPITAL_COMMUNITY)
Admission: EM | Admit: 2022-04-19 | Discharge: 2022-04-19 | Discharge status: Against Medical Advice | Payer: Medicaid Other | Attending: Emergency Medicine | Admitting: Emergency Medicine

## 2022-04-19 DIAGNOSIS — R109 Unspecified abdominal pain: Secondary | ICD-10-CM | POA: Insufficient documentation

## 2022-04-19 DIAGNOSIS — R0981 Nasal congestion: Secondary | ICD-10-CM | POA: Insufficient documentation

## 2022-04-19 DIAGNOSIS — R059 Cough, unspecified: Secondary | ICD-10-CM | POA: Insufficient documentation

## 2022-04-19 DIAGNOSIS — Z5321 Procedure and treatment not carried out due to patient leaving prior to being seen by health care provider: Secondary | ICD-10-CM | POA: Diagnosis not present

## 2022-04-19 DIAGNOSIS — J029 Acute pharyngitis, unspecified: Secondary | ICD-10-CM | POA: Diagnosis not present

## 2022-04-19 DIAGNOSIS — Z20822 Contact with and (suspected) exposure to covid-19: Secondary | ICD-10-CM | POA: Diagnosis not present

## 2022-04-19 LAB — COMPREHENSIVE METABOLIC PANEL
ALT: 17 U/L (ref 0–44)
AST: 18 U/L (ref 15–41)
Albumin: 3.9 g/dL (ref 3.5–5.0)
Alkaline Phosphatase: 80 U/L (ref 38–126)
Anion gap: 14 (ref 5–15)
BUN: 7 mg/dL (ref 6–20)
CO2: 23 mmol/L (ref 22–32)
Calcium: 9.3 mg/dL (ref 8.9–10.3)
Chloride: 102 mmol/L (ref 98–111)
Creatinine, Ser: 0.73 mg/dL (ref 0.44–1.00)
GFR, Estimated: >60 mL/min (ref >60)
Glucose, Bld: 109 mg/dL — ABNORMAL HIGH (ref 70–99)
Potassium: 4 mmol/L (ref 3.5–5.1)
Sodium: 139 mmol/L (ref 135–145)
Total Bilirubin: 0.5 mg/dL (ref 0.3–1.2)
Total Protein: 7.5 g/dL (ref 6.5–8.1)

## 2022-04-19 LAB — URINALYSIS, ROUTINE W REFLEX MICROSCOPIC
Bilirubin Urine: NEGATIVE
Glucose, UA: NEGATIVE mg/dL
Hgb urine dipstick: NEGATIVE
Ketones, ur: NEGATIVE mg/dL
Leukocytes,Ua: NEGATIVE
Nitrite: NEGATIVE
Protein, ur: NEGATIVE mg/dL
Specific Gravity, Urine: 1.019 (ref 1.005–1.030)
pH: 7 (ref 5.0–8.0)

## 2022-04-19 LAB — CBC WITH DIFFERENTIAL/PLATELET
Abs Immature Granulocytes: 0.06 10*3/uL (ref 0.00–0.07)
Basophils Absolute: 0.1 10*3/uL (ref 0.0–0.1)
Basophils Relative: 1 %
Eosinophils Absolute: 0.2 10*3/uL (ref 0.0–0.5)
Eosinophils Relative: 3 %
HCT: 42.9 % (ref 36.0–46.0)
Hemoglobin: 14.2 g/dL (ref 12.0–15.0)
Immature Granulocytes: 1 %
Lymphocytes Relative: 30 %
Lymphs Abs: 2.1 10*3/uL (ref 0.7–4.0)
MCH: 30.7 pg (ref 26.0–34.0)
MCHC: 33.1 g/dL (ref 30.0–36.0)
MCV: 92.7 fL (ref 80.0–100.0)
Monocytes Absolute: 0.6 10*3/uL (ref 0.1–1.0)
Monocytes Relative: 8 %
Neutro Abs: 4.2 10*3/uL (ref 1.7–7.7)
Neutrophils Relative %: 57 %
Platelets: 457 10*3/uL — ABNORMAL HIGH (ref 150–400)
RBC: 4.63 MIL/uL (ref 3.87–5.11)
RDW: 12.7 % (ref 11.5–15.5)
WBC: 7.2 10*3/uL (ref 4.0–10.5)
nRBC: 0 % (ref 0.0–0.2)

## 2022-04-19 LAB — RESP PANEL BY RT-PCR (FLU A&B, COVID) ARPGX2
Influenza A by PCR: NEGATIVE
Influenza B by PCR: NEGATIVE
SARS Coronavirus 2 by RT PCR: NEGATIVE

## 2022-04-19 LAB — I-STAT BETA HCG BLOOD, ED (MC, WL, AP ONLY): I-stat hCG, quantitative: <5 m[IU]/mL (ref <5)

## 2022-04-19 LAB — LIPASE, BLOOD: Lipase: 31 U/L (ref 11–51)

## 2022-04-19 NOTE — ED Notes (Signed)
Pt requesting AVS paperwork. This NT stated pt will need to be seen to receive this paperwork. Pt stated was seen by PA in triage, this NT stated that is for pt initial contact and needs to wait to see doctor to finish care out. Pt stated pt was leaving. Witness pt leaving ED.

## 2022-04-19 NOTE — ED Provider Triage Note (Signed)
Emergency Medicine Provider Triage Evaluation Note  Bethany Avery , a 21 y.o. female  was evaluated in triage.  Pt complains of cough congestion abdominal pain..  States her cough congestion started couple days ago.  States her friend and her friend's children have RSV and she is close contact with them recently.  Denies shortness of breath or chest pain.  Endorses low-grade subjective fever.  Also mentioned that she does have abdominal pain.  Concerned she might have an STI given that she has a new sexual partner.  Also endorses abnormal vaginal discharge.  Abdominal pain is in the right lower quadrant.  Denies urinary changes and bowel changes.  Patient also requesting STI exposure testing.  Review of Systems  Positive: See above Negative: See above  Physical Exam  BP 136/87 (BP Location: Right Arm)   Pulse 93   Temp 98.9 F (37.2 C)   Resp 15   SpO2 94%  Gen:   Awake, no distress   Resp:  Normal effort  MSK:   Moves extremities without difficulty  Other:    Medical Decision Making  Medically screening exam initiated at 11:10 AM.  Appropriate orders placed.  Bethany Avery was informed that the remainder of the evaluation will be completed by another provider, this initial triage assessment does not replace that evaluation, and the importance of remaining in the ED until their evaluation is complete.  Work up initiated   Gareth Eagle, PA-C 04/19/22 1112

## 2022-04-19 NOTE — ED Triage Notes (Signed)
Patient complains of 4 days of cough and congestion, started with sore throat but that has improved. Speaking complete sentences, nad. Family members have RSV

## 2022-07-16 ENCOUNTER — Encounter (HOSPITAL_COMMUNITY): Payer: Self-pay | Admitting: Emergency Medicine

## 2022-07-16 ENCOUNTER — Inpatient Hospital Stay (HOSPITAL_COMMUNITY)
Admission: AD | Admit: 2022-07-16 | Discharge: 2022-07-16 | Disposition: A | Discharge status: Home / Self Care | Payer: Medicaid Other | Attending: Obstetrics & Gynecology | Admitting: Obstetrics & Gynecology

## 2022-07-16 ENCOUNTER — Encounter (HOSPITAL_COMMUNITY): Payer: Self-pay | Admitting: Obstetrics & Gynecology

## 2022-07-16 ENCOUNTER — Ambulatory Visit (HOSPITAL_COMMUNITY)
Admission: EM | Admit: 2022-07-16 | Discharge: 2022-07-16 | Disposition: A | Discharge status: Home / Self Care | Payer: Medicaid Other | Attending: Internal Medicine | Admitting: Internal Medicine

## 2022-07-16 ENCOUNTER — Inpatient Hospital Stay (HOSPITAL_COMMUNITY): Payer: Medicaid Other

## 2022-07-16 DIAGNOSIS — R1030 Lower abdominal pain, unspecified: Secondary | ICD-10-CM | POA: Diagnosis not present

## 2022-07-16 DIAGNOSIS — O09291 Supervision of pregnancy with other poor reproductive or obstetric history, first trimester: Secondary | ICD-10-CM | POA: Diagnosis not present

## 2022-07-16 DIAGNOSIS — O26899 Other specified pregnancy related conditions, unspecified trimester: Secondary | ICD-10-CM

## 2022-07-16 DIAGNOSIS — Z3201 Encounter for pregnancy test, result positive: Secondary | ICD-10-CM | POA: Diagnosis not present

## 2022-07-16 DIAGNOSIS — O209 Hemorrhage in early pregnancy, unspecified: Secondary | ICD-10-CM | POA: Insufficient documentation

## 2022-07-16 DIAGNOSIS — O3680X0 Pregnancy with inconclusive fetal viability, not applicable or unspecified: Secondary | ICD-10-CM | POA: Diagnosis not present

## 2022-07-16 DIAGNOSIS — Z3A01 Less than 8 weeks gestation of pregnancy: Secondary | ICD-10-CM | POA: Insufficient documentation

## 2022-07-16 HISTORY — DX: Unspecified ovarian cyst, unspecified side: N83.209

## 2022-07-16 LAB — CBC
HCT: 40.8 % (ref 36.0–46.0)
Hemoglobin: 13.6 g/dL (ref 12.0–15.0)
MCH: 30.3 pg (ref 26.0–34.0)
MCHC: 33.3 g/dL (ref 30.0–36.0)
MCV: 90.9 fL (ref 80.0–100.0)
Platelets: 409 10*3/uL — ABNORMAL HIGH (ref 150–400)
RBC: 4.49 MIL/uL (ref 3.87–5.11)
RDW: 12.6 % (ref 11.5–15.5)
WBC: 8.5 10*3/uL (ref 4.0–10.5)
nRBC: 0 % (ref 0.0–0.2)

## 2022-07-16 LAB — URINALYSIS, ROUTINE W REFLEX MICROSCOPIC
Bilirubin Urine: NEGATIVE
Glucose, UA: NEGATIVE mg/dL
Hgb urine dipstick: NEGATIVE
Ketones, ur: NEGATIVE mg/dL
Leukocytes,Ua: NEGATIVE
Nitrite: NEGATIVE
Protein, ur: NEGATIVE mg/dL
Specific Gravity, Urine: 1.02 (ref 1.005–1.030)
pH: 6 (ref 5.0–8.0)

## 2022-07-16 LAB — WET PREP, GENITAL
Sperm: NONE SEEN
Trich, Wet Prep: NONE SEEN
WBC, Wet Prep HPF POC: <10 (ref <10)
Yeast Wet Prep HPF POC: NONE SEEN

## 2022-07-16 LAB — POC URINE PREG, ED: Preg Test, Ur: POSITIVE — AB

## 2022-07-16 LAB — HCG, QUANTITATIVE, PREGNANCY: hCG, Beta Chain, Quant, S: 289 m[IU]/mL — ABNORMAL HIGH (ref <5)

## 2022-07-16 MED ORDER — PRENATAL COMPLETE 14-0.4 MG PO TABS: 1.0000 | ORAL_TABLET | Freq: Every day | ORAL | 0 refills | Status: DC

## 2022-07-16 NOTE — MAU Note (Signed)
Bethany Avery is a 22 y.o. at 56w6dhere in MAU reporting: been having some cramping and slight bleeding started last wk.  Had a little bit of bleeding the week before.  Spotting off and on, none today. +HPT this morning. [facility] for [facility] eval. LMP: 1/19 Onset of complaint: couple wk Pain score: mild There were no vitals filed for this visit.    Lab orders placed from triage: urine, vag swabs

## 2022-07-16 NOTE — Discharge Instructions (Addendum)
Pregnancy test is positive Please take prenatal vitamins Establish care with an obstetrician Return to urgent care if you have any other concerns.

## 2022-07-16 NOTE — MAU Provider Note (Signed)
History     CSN: [id number]  Arrival date and time: 07/16/22 1345   Event Date/Time   First Provider Initiated Contact with Patient 07/16/22 1428      Chief Complaint  Patient presents with   Abdominal Pain   HPI Bethany Avery is a 22 y.o. G3P1011 at 4w6dby LMP who presents to MAU for lower abdominal cramping and vaginal spotting. She reports she had an episode of vaginal bleeding approximately 3 weeks ago which went away. However last week she had vaginal spotting again along with cramping. The spotting has since resolved however the cramping has continued. She reports she took a UPT at home today which was positive. She denies urinary s/s, itching, or odor. LMP was 06/12/2022.   OB History     Gravida  3   Para  1   Term  1   Preterm      AB  1   Living  1      SAB  1   IAB      Ectopic      Multiple  0   Live Births  1           Past Medical History:  Diagnosis Date   ADHD (attention deficit hyperactivity disorder)    Anxiety    Cesarean delivery delivered 12/02/2020   Failure to progress. 5cm   Depression    Gestational diabetes    Gestational diabetes mellitus, class A2 09/28/2020   [facility] Guidelines for Antenatal Testing and Sonography                       Updated  08/12/2020 with Dr. RTama High INDICATION Growth U/S BPP weekly DELIVERY RECOMMENDATION (GA)  Diabetes   A1 - good control     A2 - good control     A2  - poor control or poor compliance    (Macrosomia or polyhydramnios)     A2/B and B-C Pregestational Type II DM    Poor control B-C or D-R-F-T  or  Type I DM     Gestational hypertension 07/03/2020   Gonorrhea 02/02/2018   Headache    Headache in pregnancy, antepartum, second trimester 07/03/2020   Indication for care or intervention in labor or delivery 11/30/2020   Obesity in pregnancy, antepartum 05/31/2020   Ovarian cyst    Positive GBS test 11/25/2020   Pregnant 11/30/2020   Prolonged rupture of membranes 12/02/2020    Supervision of high risk pregnancy, antepartum 05/31/2020    Nursing Staff Provider  Office Location  [facility] Dating  Ultrasound  Language  English Anatomy [place] Normal, but limited. F/U Scheduled  Flu Vaccine   Genetic Screen  NIPS: low risk female AFP:    TDaP Vaccine   09/26/20 Hgb A1C or  GTT Early   A1C 5.4 Third trimester Abnormal  Glucose, Fasting 65 - 91 mg/dL 94 High    Glucose, 1 hour 65 - 179 mg/dL 155   Glucose, 2 hour 65 - 152 mg/dL 93     COVID    Past Surgical History:  Procedure Laterality Date   CESAREAN SECTION N/A 12/02/2020   Procedure: CESAREAN SECTION;  Surgeon: EClarnce Flock MD;  [facility];  Service: Obstetrics;  Laterality: N/A;    Family History  Problem Relation Age of Onset   Hypertension Mother    Healthy Father     Social History   Tobacco Use   Smoking  status: Never   Smokeless tobacco: Never  Vaping Use   Vaping Use: Never used  Substance Use Topics   Alcohol use: Yes    Comment: few times a wk, drank yesterday 2/21   Drug use: Yes    Frequency: 14.0 times per week    Types: Marijuana    Comment: 2-3x a day, last was 2/21 in the morning    Allergies:  Allergies  Allergen Reactions   Nickel Rash    No medications prior to admission.   Review of Systems  Constitutional: Negative.   Gastrointestinal:  Positive for abdominal pain (cramping).  Genitourinary:  Positive for vaginal bleeding (spotting).  All other systems reviewed and are negative.  Physical Exam   Blood pressure 124/72, pulse 73, temperature 98.7 F (37.1 C), temperature source Oral, resp. rate 16, height 5' 5"$  (1.651 m), weight 104.2 kg, last menstrual period 06/12/2022, SpO2 98 %, currently breastfeeding.  Physical Exam Vitals and nursing note reviewed.  Constitutional:      General: She is not in acute distress.    Appearance: She is obese.  Eyes:     Extraocular Movements: Extraocular movements intact.     Pupils: Pupils are equal, round, and reactive  to light.  Cardiovascular:     Rate and Rhythm: Normal rate.  Pulmonary:     Effort: Pulmonary effort is normal.  Abdominal:     Palpations: Abdomen is soft.     Tenderness: There is no abdominal tenderness.  Genitourinary:    Comments: Patient self swabbed Skin:    General: Skin is warm and dry.  Neurological:     General: No focal deficit present.     Mental Status: She is alert and oriented to person, place, and time.  Psychiatric:        Mood and Affect: Mood normal.        Behavior: Behavior normal.    Results for orders placed or performed during the hospital encounter of 07/16/22 (from the past 24 hour(s))  Urinalysis, Routine w reflex microscopic -Urine, Clean Catch     Status: None   Collection Time: 07/16/22  2:24 PM  Result Value Ref Range   Color, Urine YELLOW YELLOW   APPearance CLEAR CLEAR   Specific Gravity, Urine 1.020 1.005 - 1.030   pH 6.0 5.0 - 8.0   Glucose, UA NEGATIVE NEGATIVE mg/dL   Hgb urine dipstick NEGATIVE NEGATIVE   Bilirubin Urine NEGATIVE NEGATIVE   Ketones, ur NEGATIVE NEGATIVE mg/dL   Protein, ur NEGATIVE NEGATIVE mg/dL   Nitrite NEGATIVE NEGATIVE   Leukocytes,Ua NEGATIVE NEGATIVE  Wet prep, genital     Status: Abnormal   Collection Time: 07/16/22  2:24 PM   Specimen: Vaginal  Result Value Ref Range   Yeast Wet Prep HPF POC NONE SEEN NONE SEEN   Trich, Wet Prep NONE SEEN NONE SEEN   Clue Cells Wet Prep HPF POC PRESENT (A) NONE SEEN   WBC, Wet Prep HPF POC <10 <10   Sperm NONE SEEN   CBC     Status: Abnormal   Collection Time: 07/16/22  2:29 PM  Result Value Ref Range   WBC 8.5 4.0 - 10.5 K/uL   RBC 4.49 3.87 - 5.11 MIL/uL   Hemoglobin 13.6 12.0 - 15.0 g/dL   HCT 40.8 36.0 - 46.0 %   MCV 90.9 80.0 - 100.0 fL   MCH 30.3 26.0 - 34.0 pg   MCHC 33.3 30.0 - 36.0 g/dL   RDW 12.6 11.5 -  15.5 %   Platelets 409 (H) 150 - 400 K/uL   nRBC 0.0 0.0 - 0.2 %  hCG, quantitative, pregnancy     Status: Abnormal   Collection Time: 07/16/22  2:29  PM  Result Value Ref Range   hCG, Beta Chain, Quant, S 289 (H) <5 mIU/mL   US OB LESS THAN 14 WEEKS WITH OB TRANSVAGINAL  Result Date: 07/16/2022 CLINICAL DATA:  Cramping slight bili leading. Estimated gestational age by last menstrual period equals 4 weeks 6 days EXAM: OBSTETRIC <14 WK [place] AND TRANSVAGINAL OB US TECHNIQUE: Both transabdominal and transvaginal ultrasound examinations were performed for complete evaluation of the gestation as well as the maternal uterus, adnexal regions, and pelvic cul-de-sac. Transvaginal technique was performed to assess early pregnancy. COMPARISON:  None Available. FINDINGS: Intrauterine gestational sac: None identified Yolk sac:  Not identified Embryo:  Not identified Subchorionic hemorrhage:  None visualized. Maternal uterus/adnexae: Normal ovaries with small follicles. IMPRESSION: No intrauterine gestational sac, yolk sac, or fetal pole identified. Differential considerations include intrauterine pregnancy too early to be sonographically visualized, missed abortion, or ectopic pregnancy. Followup ultrasound is recommended in 10-14 days for further evaluation. Electronically Signed   By: Suzy Bouchard M.D.   On: 07/16/2022 15:24    MAU Course  Procedures  MDM UA CBC, HCG Wet prep, GC/CT [place]  UA negative. Labs reassuring. Hcg 289. Blood type is A positive, Rhogam not indicated. [place] without evidence of IUP or ectopic. Will have patient follow up in 48 hours for repeat bHCG. Given it will be the weekend, patient will return to MAU for labs.  Assessment and Plan   1. Pregnancy of unknown anatomic location   2. Vaginal bleeding affecting early pregnancy   3. Abdominal pain affecting pregnancy    - Discharge home in stable condition - Return to MAU in 48 hours for repeat bHCG. Return sooner for new/worsening symptoms  Renee Harder, CNM 07/16/2022, 3:49 PM

## 2022-07-16 NOTE — ED Provider Notes (Signed)
Bethany Avery    CSN: [id number] Arrival date & time: 07/16/22  1131      History   Chief Complaint Chief Complaint  Patient presents with   Possible Pregnancy    HPI Bethany Avery is a 22 y.o. female comes to the urgent care for pregnancy test.  Her last menstrual period was 06/12/2022.  She is sexually active and engages in unprotected sexual intercourse.  She did a home pregnancy test on 2 occasions and both of them were positive.  She is coming to the urgent care to confirm pregnancy.  No nausea or vomiting.  No morning sickness.  No abdominal pain or recent vaginal bleeding.Marland Kitchen   HPI  Past Medical History:  Diagnosis Date   ADHD (attention deficit hyperactivity disorder)    Anxiety    Cesarean delivery delivered 12/02/2020   Failure to progress. 5cm   Depression    Gestational diabetes    Gestational diabetes mellitus, class A2 09/28/2020   [facility] Guidelines for Antenatal Testing and Sonography                       Updated  08/12/2020 with Dr. Tama High  INDICATION Growth U/S BPP weekly DELIVERY RECOMMENDATION ([place])  Diabetes   A1 - good control     A2 - good control     A2  - poor control or poor compliance    (Macrosomia or polyhydramnios)     A2/B and B-C Pregestational Type II DM    Poor control B-C or D-R-F-T  or  Type I DM     Gestational hypertension 07/03/2020   Gonorrhea 02/02/2018   Headache    Headache in pregnancy, antepartum, second trimester 07/03/2020   Indication for care or intervention in labor or delivery 11/30/2020   Obesity in pregnancy, antepartum 05/31/2020   Ovarian cyst    Positive GBS test 11/25/2020   Pregnant 11/30/2020   Prolonged rupture of membranes 12/02/2020   Supervision of high risk pregnancy, antepartum 05/31/2020    Nursing Staff Provider  Office Location  [facility] Dating  Ultrasound  Language  English Anatomy US  Normal, but limited. 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   Adjustment disorder with mixed anxiety and depressed mood 04/02/2020   Marijuana user 06/22/2018   Gonorrhea 02/02/2018   Adolescent behavior problem    Attention deficit hyperactivity disorder 08/24/2013    Past Surgical History:  Procedure Laterality Date   CESAREAN SECTION N/A 12/02/2020   Procedure: CESAREAN SECTION;  Surgeon: Clarnce Flock, MD;  Location: [facility];  Service: Obstetrics;  Laterality: N/A;    OB History     Gravida  3   Para  1   Term  1   Preterm      AB  1   Living  1      SAB  1   IAB      Ectopic      Multiple  0  Live Births  1            Home Medications    Prior to Admission medications   Medication Sig Start Date End Date Taking? Authorizing Provider  Prenatal Vit-Fe Fumarate-FA (PRENATAL COMPLETE) 14-0.4 MG TABS Take 1 tablet by mouth daily. 07/16/22  Yes Maykayla [place], Myrene Galas, MD  albuterol (VENTOLIN HFA) 108 (90 Base) MCG/ACT inhaler Inhale 2 puffs into the lungs as needed. 06/09/21   [provider]  cetirizine (ZYRTEC) 10 MG tablet Take 10 mg by mouth daily. 09/15/21   [provider]  escitalopram (LEXAPRO) 5 MG tablet Take 5 mg by mouth daily. 07/22/21   [provider]  fluticasone (FLONASE) 50 MCG/ACT nasal spray Place 1 spray into both nostrils daily. 10/17/21   [provider]  hydrOXYzine (ATARAX) 25 MG tablet Take 25 mg by mouth 3 (three) times daily. 01/16/22   [provider]  metroNIDAZOLE (FLAGYL) 500 MG tablet Take 1 tablet (500 mg total) by mouth 2 (two) times daily. 01/30/22   Piontek, Junie Panning, MD  valACYclovir (VALTREX) 500 MG tablet Take 500 mg by mouth daily. 07/23/21    [provider]  metoCLOPramide (REGLAN) 10 MG tablet Take 1 tablet (10 mg total) by mouth every 8 (eight) hours as needed (take with tylenol for headaches). 07/03/20 12/01/20  Jorje Guild, NP    Family History Family History  Problem Relation Age of Onset   Hypertension Mother    Healthy Father     Social History Social History   Tobacco Use   Smoking status: Never   Smokeless tobacco: Never  Vaping Use   Vaping Use: Never used  Substance Use Topics   Alcohol use: Yes    Comment: few times a wk, drank yesterday 2/21   Drug use: Yes    Frequency: 14.0 times per week    Types: Marijuana    Comment: 2-3x a day, last was 2/21 in the morning     Allergies   Nickel   Review of Systems Review of Systems As per HPI  Physical Exam Triage Vital Signs ED Triage Vitals  Enc Vitals Group     BP 07/16/22 1315 118/76     Pulse Rate 07/16/22 1315 89     Resp 07/16/22 1315 15     Temp 07/16/22 1315 98.6 F (37 C)     Temp Source 07/16/22 1315 Oral     SpO2 07/16/22 1315 99 %     Weight --      Height --      Head Circumference --      Peak Flow --      Pain Score 07/16/22 1314 0     Pain Loc --      Pain Edu? --      Excl. in [place]? --    No data found.  Updated Vital Signs BP 118/76 (BP Location: Left Arm)   Pulse 89   Temp 98.6 F (37 C) (Oral)   Resp 15   LMP 06/12/2022   SpO2 99%   Visual Acuity Right Eye Distance:   Left Eye Distance:   Bilateral Distance:    Right Eye Near:   Left Eye Near:    Bilateral Near:     Physical Exam Constitutional:      Appearance: Normal appearance.  Cardiovascular:     Rate and Rhythm: Normal rate and regular rhythm.  Pulmonary:     Effort: Pulmonary effort is normal.  Breath sounds: Normal breath sounds.  Abdominal:     General: Bowel sounds are normal.     Palpations: Abdomen is soft.  Musculoskeletal:        General: Normal range of motion.  Neurological:     Mental Status: She is alert.       UC Treatments / Results  Labs (all labs ordered are listed, but only abnormal results are displayed) Labs Reviewed  POC URINE PREG, ED - Abnormal; Notable for the following components:      Result Value   Preg Test, Ur POSITIVE (*)    All other components within normal limits    EKG   Radiology   Procedures Procedures (including critical care time)  Medications Ordered in UC Medications - No data to display  Initial Impression / Assessment and Plan / UC Course  I have reviewed the triage vital signs and the nursing notes.  Pertinent labs & imaging results that were available during my care of the patient were reviewed by me and considered in my medical decision making (see chart for details).     1.  Positive pregnancy test in first trimester: Urine pregnancy test is positive Prenatal vitamins recommended Maintain adequate hydration Establish care with an obstetrician. Final Clinical Impressions(s) / UC Diagnoses   Final diagnoses:  Positive pregnancy test     Discharge Instructions      Pregnancy test is positive Please take prenatal vitamins Establish care with an obstetrician Return to urgent care if you have any other concerns.   ED Prescriptions     Medication Sig Dispense Auth. Provider   Prenatal Vit-Fe Fumarate-FA (PRENATAL COMPLETE) 14-0.4 MG TABS Take 1 tablet by mouth daily. 60 tablet Dwight Adamczak, Myrene Galas, MD      PDMP not reviewed this encounter.   Chase Picket, MD 07/16/22 [id number]

## 2022-07-16 NOTE — ED Triage Notes (Signed)
LMP 1/19, reports had two home pregnancy tests that are positive.

## 2022-07-17 LAB — GC/CHLAMYDIA PROBE AMP (~~LOC~~) NOT AT ARMC
Chlamydia: NEGATIVE
Comment: NEGATIVE
Comment: NORMAL
Neisseria Gonorrhea: NEGATIVE

## 2022-07-18 ENCOUNTER — Inpatient Hospital Stay (HOSPITAL_COMMUNITY)
Admission: AD | Admit: 2022-07-18 | Discharge: 2022-07-18 | Disposition: A | Discharge status: Home / Self Care | Payer: Medicaid Other | Attending: Obstetrics & Gynecology | Admitting: Obstetrics & Gynecology

## 2022-07-18 ENCOUNTER — Inpatient Hospital Stay (HOSPITAL_COMMUNITY)
Admit: 2022-07-18 | Discharge: 2022-07-18 | Disposition: A | Discharge status: Home / Self Care | Payer: Medicaid Other | Attending: Obstetrics & Gynecology | Admitting: Obstetrics & Gynecology

## 2022-07-18 DIAGNOSIS — Z3A01 Less than 8 weeks gestation of pregnancy: Secondary | ICD-10-CM

## 2022-07-18 DIAGNOSIS — O3680X0 Pregnancy with inconclusive fetal viability, not applicable or unspecified: Secondary | ICD-10-CM

## 2022-07-18 DIAGNOSIS — O0281 Inappropriate change in quantitative human chorionic gonadotropin (hCG) in early pregnancy: Secondary | ICD-10-CM | POA: Diagnosis not present

## 2022-07-18 LAB — HCG, QUANTITATIVE, PREGNANCY: hCG, Beta Chain, Quant, S: 557 m[IU]/mL — ABNORMAL HIGH (ref <5)

## 2022-07-18 NOTE — MAU Provider Note (Signed)
S Bethany Avery is a 22 y.o. [phone] pregnant female at 14w1dwho presents to MAU today for repeat quant. Denies vaginal bleeding/cramping. No other physical complaints.  [facility]   Pertinent items noted in HPI and remainder of comprehensive ROS otherwise negative.   O BP 115/68   Pulse 90   Temp 98.9 F (37.2 C)   Resp 18   Ht '5\' 5"'$  (1.651 m)   Wt 229 lb 11.2 oz (104.2 kg)   LMP 06/12/2022   BMI 38.22 kg/m  Physical Exam Vitals and nursing note reviewed.  Constitutional:      Appearance: Normal appearance.  Eyes:     Pupils: Pupils are equal, round, and reactive to light.  Cardiovascular:     Rate and Rhythm: Normal rate.  Pulmonary:     Effort: Pulmonary effort is normal.  Musculoskeletal:        General: Normal range of motion.  Skin:    General: Skin is warm and dry.     Capillary Refill: Capillary refill takes less than 2 seconds.  Neurological:     Mental Status: She is alert and oriented to person, place, and time.  Psychiatric:        Mood and Affect: Mood normal.        Behavior: Behavior normal.    Results for orders placed or performed during the hospital encounter of 07/18/22 (from the past 24 hour(s))  hCG, quantitative, pregnancy     Status: Abnormal   Collection Time: 07/18/22  9:42 AM  Result Value Ref Range   hCG, Beta Chain, Quant, S 557 (H) <5 mIU/mL   MDM: Straightforward  MAU Course: Previous quant = 289, this is an appropriate rise. Given absence of anything on ultrasound, will get another appropriate rise before releasing for viability scan in 10-14 days. Next quant can be completed at the office, [facility] Results shared with patient, she is amenable to the plan.  A Pregnancy of unknown location  P Discharge from MAU in stable condition with ectopic precautions [facility]  Allergies as of  07/18/2022       Reactions   Nickel Rash        Medication List     TAKE these medications    cetirizine 10 MG tablet Commonly known as: ZYRTEC Take 10 mg by mouth daily.   escitalopram 5 MG tablet Commonly known as: LEXAPRO Take 5 mg by mouth daily.   fluticasone 50 MCG/ACT nasal spray Commonly known as: FLONASE Place 1 spray into both nostrils daily.   hydrOXYzine 25 MG tablet Commonly known as: ATARAX Take 25 mg by mouth 3 (three) times daily.   metroNIDAZOLE 500 MG tablet Commonly known as: FLAGYL Take 1 tablet (500 mg total) by mouth 2 (two) times daily.   Prenatal Complete 14-0.4 MG Tabs Take 1 tablet by mouth daily.   valACYclovir 500 MG tablet Commonly known as: VALTREX Take 500 mg by mouth daily.   Ventolin HFA 108 (90 Base) MCG/ACT inhaler Generic drug: albuterol Inhale 2 puffs into the lungs as needed.        WGabriel Carina CNorth Dakota2/24/2024 11:29 AM

## 2022-07-18 NOTE — MAU Note (Signed)
Pt here for repeat BHCG. Denies any vag bleeding or pain today.

## 2022-07-20 ENCOUNTER — Other Ambulatory Visit: Payer: Self-pay

## 2022-07-20 ENCOUNTER — Ambulatory Visit: Payer: Medicaid Other | Admitting: *Deleted

## 2022-07-20 VITALS — BP 112/76 | HR 104 | Ht 65.0 in | Wt 223.8 lb

## 2022-07-20 DIAGNOSIS — O3680X0 Pregnancy with inconclusive fetal viability, not applicable or unspecified: Secondary | ICD-10-CM

## 2022-07-20 LAB — BETA HCG QUANT (REF LAB): hCG Quant: 1139 m[IU]/mL

## 2022-07-20 NOTE — Progress Notes (Signed)
Here for stat bhcg.She denies pain or bleeding today.  I explained we will call her in a few hours with results and plan of care. She voices understanding. Staci Acosta 3:50pm. Results received and reviewed with Dr. Roselie Awkward. He advised appropriate rise and to start prenatal care and repeat US in 10-14 days. I called [place] and reviewed results and plan of care. [place] scheduled for first available for 08/11/22. She voices understanding. Staci Acosta

## 2022-07-21 ENCOUNTER — Institutional Professional Consult (permissible substitution): Payer: Medicaid Other | Admitting: Plastic Surgery

## 2022-07-26 ENCOUNTER — Encounter (HOSPITAL_COMMUNITY): Payer: Self-pay | Admitting: Obstetrics & Gynecology

## 2022-07-26 ENCOUNTER — Other Ambulatory Visit: Payer: Self-pay

## 2022-07-26 ENCOUNTER — Inpatient Hospital Stay (HOSPITAL_COMMUNITY): Payer: Medicaid Other

## 2022-07-26 ENCOUNTER — Inpatient Hospital Stay (HOSPITAL_COMMUNITY)
Admission: AD | Admit: 2022-07-26 | Discharge: 2022-07-26 | Disposition: A | Discharge status: Home / Self Care | Payer: Medicaid Other | Attending: Obstetrics & Gynecology | Admitting: Obstetrics & Gynecology

## 2022-07-26 DIAGNOSIS — R109 Unspecified abdominal pain: Secondary | ICD-10-CM | POA: Diagnosis present

## 2022-07-26 DIAGNOSIS — O008 Other ectopic pregnancy without intrauterine pregnancy: Secondary | ICD-10-CM | POA: Diagnosis not present

## 2022-07-26 DIAGNOSIS — Z3A01 Less than 8 weeks gestation of pregnancy: Secondary | ICD-10-CM

## 2022-07-26 LAB — URINALYSIS, ROUTINE W REFLEX MICROSCOPIC
Bacteria, UA: NONE SEEN
Bilirubin Urine: NEGATIVE
Glucose, UA: NEGATIVE mg/dL
Hgb urine dipstick: NEGATIVE
Ketones, ur: NEGATIVE mg/dL
Leukocytes,Ua: NEGATIVE
Nitrite: NEGATIVE
Protein, ur: NEGATIVE mg/dL
Specific Gravity, Urine: 1.02 (ref 1.005–1.030)
pH: 7 (ref 5.0–8.0)

## 2022-07-26 LAB — COMPREHENSIVE METABOLIC PANEL
ALT: 20 U/L (ref 0–44)
AST: 17 U/L (ref 15–41)
Albumin: 3.6 g/dL (ref 3.5–5.0)
Alkaline Phosphatase: 71 U/L (ref 38–126)
Anion gap: 11 (ref 5–15)
BUN: 7 mg/dL (ref 6–20)
CO2: 24 mmol/L (ref 22–32)
Calcium: 8.7 mg/dL — ABNORMAL LOW (ref 8.9–10.3)
Chloride: 103 mmol/L (ref 98–111)
Creatinine, Ser: 0.78 mg/dL (ref 0.44–1.00)
GFR, Estimated: >60 mL/min (ref >60)
Glucose, Bld: 97 mg/dL (ref 70–99)
Potassium: 4 mmol/L (ref 3.5–5.1)
Sodium: 138 mmol/L (ref 135–145)
Total Bilirubin: 0.4 mg/dL (ref 0.3–1.2)
Total Protein: 6.6 g/dL (ref 6.5–8.1)

## 2022-07-26 LAB — HCG, QUANTITATIVE, PREGNANCY: hCG, Beta Chain, Quant, S: 4623 m[IU]/mL — ABNORMAL HIGH (ref <5)

## 2022-07-26 NOTE — Progress Notes (Signed)
Patient discharged and informed to return to MAU at 0800 tomorrow morning for methotrexate injection. verbalized understanding importance of returning. Patient tearful upon leaving with support person.

## 2022-07-26 NOTE — Progress Notes (Signed)
Bethany Avery is a 22 yo G3P1011 (h/o SAB) who presented with cramping in setting of normally rising HCG levels. She had no yet had confirmed IUP so [place] was repeated today and confirmed to have a right cornual/interstitial ectopic pregnancy.   She shows no sign of rupture at this time clinically or radiologically. The pregnancy measures about 1 cm, no live IUP. I reviewed the images myself. Yolk sac is also present.   Her HCG today is 4623.   (Last HCG on 2/24 557).   Plan: - We discussed the finding of cornual ectopic and what this entails. I was very clear that this cannot result in a live pregnancy and no intervention would lead to rupture and would be an emergency surgery in that setting and potentially life threatening.  - We discussed the options available: surgery and methotrexate.  - We discussed surgery: we discussed the surgery and the different routes and approaches I.e. laparoscopic vs open. We discussed sometimes a surgery is started open but may need to convert to open. We discussed that sometimes the tube can be spared but sometimes it cannot or is damaged in the process of surgery. Discussed I would recommend waiting on pregnancy following the surgery to reduce the risk of rupture and recurrence. We discussed that sometimes following surgery we will also do MTX to ensure complete resolution as well. The benefit to surgery would be to ensure immediate resolution with less follow up.  - We discussed MTX: We discussed that for much of the history of this type of ectopic it was managed by surgery but that is also because they are often not found this early. Due to hers being found this early, she has the option for conservative management with MTX. Reviewed this would involve an injection given here in the MAU and then with followup HCG levels until they resolved to 0. We discussed that even following MTX, rupture can still occur and surgery can be necessary. The benefit to MTX is it  potentially avoids surgery and may preserve the function of that right tube/cornua.  - After going through her options, I have her and partner the opportunity to ask questions. All questions answered.  - Following the conversation, she thinks she would like to do MTX but does not want to receive the injection today. She reports she would like to come back tomorrow. We reviewed it would be better/safer to start therapy today. She declines and will come back tomorrow.  - Team on for tomorrow and tonight notified of patient.  Radene Gunning, MD Attending [place], [facility] for [facility] [place]

## 2022-07-26 NOTE — MAU Note (Signed)
Bethany Avery is a 22 y.o. at 59w2dhere in MAU reporting: started having mild cramping yesterday, states it was a lot last night but today has improved. Saw some spotting this AM but that stopped. No other abnormal discharge. Had intercourse yesterday.  Onset of complaint: yesterday  Pain score: 5/10  Vitals:   07/26/22 1143  BP: 115/64  Pulse: 95  Resp: 16  Temp: 99 F (37.2 C)  SpO2: 98%     FHT:NA  Lab orders placed from triage: UA

## 2022-07-26 NOTE — MAU Provider Note (Signed)
History     CSN: [id number]  Arrival date and time: 07/26/22 1105   None     Chief Complaint  Patient presents with   Abdominal Pain   Bethany Avery is a 22 y.o.G3P1011 at 77w2dwho presents today with cramping. She has been followed with serial HCGs due to pregnancy of unknown location on 2/22. HCG has been rising. She states that last night she started to have cramping and it kept her up most of the night. She did not know what she could take so she has not taken anything.   Last time she had anything to eat or drink: 10:00pm on 07/25/2022.   Pelvic Pain The patient's primary symptoms include pelvic pain and vaginal bleeding. This is a new problem. The current episode started yesterday. The problem occurs constantly. The problem has been unchanged. The problem affects the left side. She is pregnant. The vaginal bleeding is spotting. She has not been passing clots. She has not been passing tissue. Nothing aggravates the symptoms. She has tried nothing for the symptoms.    OB History     Gravida  3   Para  1   Term  1   Preterm      AB  1   Living  1      SAB  1   IAB      Ectopic      Multiple  0   Live Births  1           Past Medical History:  Diagnosis Date   ADHD (attention deficit hyperactivity disorder)    Anxiety    Cesarean delivery delivered 12/02/2020   Failure to progress. 5cm   Depression    Gestational diabetes    Gestational diabetes mellitus, class A2 09/28/2020   [facility]/MFM Guidelines for Antenatal Testing and Sonography                       Updated  08/12/2020 with Dr. RTama High INDICATION Growth U/S BPP weekly DELIVERY RECOMMENDATION (GA)  Diabetes   A1 - good control     A2 - good control     A2  - poor control or poor compliance    (Macrosomia or polyhydramnios)     A2/B and B-C Pregestational Type II DM    Poor control B-C or D-R-F-T  or  Type I DM     Gestational hypertension 07/03/2020   Gonorrhea 02/02/2018   Headache     Headache in pregnancy, antepartum, second trimester 07/03/2020   Indication for care or intervention in labor or delivery 11/30/2020   Obesity in pregnancy, antepartum 05/31/2020   Ovarian cyst    Positive GBS test 11/25/2020   Pregnant 11/30/2020   Prolonged rupture of membranes 12/02/2020   Supervision of high risk pregnancy, antepartum 05/31/2020    Nursing Staff Provider  Office Location  [facility] Dating  Ultrasound  Language  English Anatomy [place] Normal, but limited. F/U Scheduled  Flu Vaccine   Genetic Screen  NIPS: low risk female AFP:    TDaP Vaccine   09/26/20 Hgb A1C or  GTT Early   A1C 5.4 Third trimester Abnormal  Glucose, Fasting 65 - 91 mg/dL 94 High    Glucose, 1 hour 65 - 179 mg/dL 155   Glucose, 2 hour 65 - 152 mg/dL 93     COVID    Past Surgical History:  Procedure Laterality Date   CESAREAN SECTION N/A  12/02/2020   Procedure: CESAREAN SECTION;  Surgeon: Clarnce Flock, MD;  Location: [facility] LD ORS;  Service: Obstetrics;  Laterality: N/A;    Family History  Problem Relation Age of Onset   Hypertension Mother    Healthy Father     Social History   Tobacco Use   Smoking status: Never   Smokeless tobacco: Never  Vaping Use   Vaping Use: Never used  Substance Use Topics   Alcohol use: Yes    Comment: few times a wk, drank yesterday 2/21   Drug use: Yes    Frequency: 14.0 times per week    Types: Marijuana    Comment: 2-3x a day, last was 2/21 in the morning    Allergies:  Allergies  Allergen Reactions   Nickel Rash    Medications Prior to Admission  Medication Sig Dispense Refill Last Dose   metroNIDAZOLE (FLAGYL) 500 MG tablet Take 1 tablet (500 mg total) by mouth 2 (two) times daily. 14 tablet 0 Past Month   Prenatal Vit-Fe Fumarate-FA (PRENATAL COMPLETE) 14-0.4 MG TABS Take 1 tablet by mouth daily. 60 tablet 0 07/25/2022   albuterol (VENTOLIN HFA) 108 (90 Base) MCG/ACT inhaler Inhale 2 puffs into the lungs as needed.      cetirizine (ZYRTEC) 10 MG  tablet Take 10 mg by mouth daily.      escitalopram (LEXAPRO) 5 MG tablet Take 5 mg by mouth daily. (Patient not taking: Reported on 07/20/2022)      fluticasone (FLONASE) 50 MCG/ACT nasal spray Place 1 spray into both nostrils daily.      hydrOXYzine (ATARAX) 25 MG tablet Take 25 mg by mouth 3 (three) times daily.      valACYclovir (VALTREX) 500 MG tablet Take 500 mg by mouth daily. (Patient not taking: Reported on 07/20/2022)       Review of Systems  Genitourinary:  Positive for pelvic pain.  All other systems reviewed and are negative.  Physical Exam   Blood pressure 123/78, pulse 87, temperature 99 F (37.2 C), temperature source Oral, resp. rate 16, height '5\' 5"'$  (1.651 m), weight 104.8 kg, last menstrual period 06/12/2022, SpO2 100 %, currently breastfeeding.  Physical Exam Vitals and nursing note reviewed.  Constitutional:      Appearance: She is well-developed.  HENT:     Head: Normocephalic.  Eyes:     Pupils: Pupils are equal, round, and reactive to light.  Cardiovascular:     Rate and Rhythm: Normal rate.  Pulmonary:     Effort: Pulmonary effort is normal. No respiratory distress.  Abdominal:     Palpations: Abdomen is soft.     Tenderness: There is no abdominal tenderness.  Genitourinary:    Vagina: No bleeding.     Comments:      Musculoskeletal:        General: Normal range of motion.     Cervical back: Normal range of motion and neck supple.  Skin:    General: Skin is warm and dry.  Neurological:     Mental Status: She is alert and oriented to person, place, and time.  Psychiatric:        Mood and Affect: Mood normal.        Behavior: Behavior normal.     Results for orders placed or performed during the hospital encounter of 07/26/22 (from the past 24 hour(s))  hCG, quantitative, pregnancy     Status: Abnormal   Collection Time: 07/26/22 11:57 AM  Result Value Ref Range  hCG, Beta Chain, Quant, S 4,623 (H) <5 mIU/mL  Comprehensive metabolic panel      Status: Abnormal   Collection Time: 07/26/22 11:57 AM  Result Value Ref Range   Sodium 138 135 - 145 mmol/L   Potassium 4.0 3.5 - 5.1 mmol/L   Chloride 103 98 - 111 mmol/L   CO2 24 22 - 32 mmol/L   Glucose, Bld 97 70 - 99 mg/dL   BUN 7 6 - 20 mg/dL   Creatinine, Ser 0.78 0.44 - 1.00 mg/dL   Calcium 8.7 (L) 8.9 - 10.3 mg/dL   Total Protein 6.6 6.5 - 8.1 g/dL   Albumin 3.6 3.5 - 5.0 g/dL   AST 17 15 - 41 U/L   ALT 20 0 - 44 U/L   Alkaline Phosphatase 71 38 - 126 U/L   Total Bilirubin 0.4 0.3 - 1.2 mg/dL   GFR, Estimated >60 >60 mL/min   Anion gap 11 5 - 15  Urinalysis, Routine w reflex microscopic -Urine, Clean Catch     Status: None   Collection Time: 07/26/22 12:12 PM  Result Value Ref Range   Color, Urine YELLOW YELLOW   APPearance CLEAR CLEAR   Specific Gravity, Urine 1.020 1.005 - 1.030   pH 7.0 5.0 - 8.0   Glucose, UA NEGATIVE NEGATIVE mg/dL   Hgb urine dipstick NEGATIVE NEGATIVE   Bilirubin Urine NEGATIVE NEGATIVE   Ketones, ur NEGATIVE NEGATIVE mg/dL   Protein, ur NEGATIVE NEGATIVE mg/dL   Nitrite NEGATIVE NEGATIVE   Leukocytes,Ua NEGATIVE NEGATIVE   RBC / HPF 0-5 0 - 5 RBC/hpf   WBC, UA 0-5 0 - 5 WBC/hpf   Bacteria, UA NONE SEEN NONE SEEN   Squamous Epithelial / HPF 0-5 0 - 5 /HPF   Mucus PRESENT    US OB Transvaginal  Result Date: 07/26/2022 CLINICAL DATA:  [id number] Pelvic pain affecting pregnancy in first trimester, antepartum [id number] EXAM: TRANSVAGINAL OB ULTRASOUND TECHNIQUE: Transvaginal ultrasound was performed for complete evaluation of the gestation as well as the maternal uterus, adnexal regions, and pelvic cul-de-sac. COMPARISON:  July 16, 2022. FINDINGS: Within the RIGHT cornua of the uterus, there is visualization of a gestational sac. No gestational sac is visualized within the main endometrial canal. MSD: 9.8  mm   5 w   5 d LMP: 06/12/2022. Gestational age by LMP is 6 weeks 2 days. EDC by LMP is 03/19/2023. Gestational age would be assigned by LMP.  Subchorionic hemorrhage: None Right ovary: Unremarkable. Measures 2.9 x 2.6 by 3.2 cm for volume of 12.6 ML. Left ovary: Unremarkable. Measures 1.8 x 2.8 x 2.2 cm for a volume of 6.0 ML. Free fluid:  Small volume free fluid, simple in appearance. IMPRESSION: RIGHT cornual ectopic pregnancy. These results were called by telephone at the time of interpretation on 07/26/2022 at 12:45 pm to provider [facility] , who verbally acknowledged these results. Electronically Signed   By: Valentino Saxon M.D.   On: 07/26/2022 12:49     MAU Course  Procedures  MDM 1313: Dr. Damita Dunnings here to review with patient results and options.  1348: Patient asking if she can return tomorrow for MTX. She feels she needs more time to cope and process everything. Patient advised that the best option is to give MTX today as this increases the likelihood of success. Patient understands, but would like to return tomorrow. Dr. Damita Dunnings agrees that patient can return tomorrow but she needs to come first thing in the morning. Will message MAU providers  that are working tomorrow so they are aware that she will be coming.   Assessment and Plan   1. Other ectopic pregnancy without intrauterine pregnancy    DC home in stable condition  Bleeding precautions Ectopic precautions RX: none  Patient advised to return here tomorrow morning, first thing or sooner if needed.    Follow-up Information     [facility] Follow up.   Specialty: Obstetrics and Gynecology Why: Please return tomorrow Monday 07/27/2022 first thing in the morning or sooner if needed Contact information: [address] [id number] [place] DNP, CNM  07/26/22  1:54 PM

## 2022-07-27 ENCOUNTER — Telehealth: Payer: Self-pay | Admitting: Obstetrics and Gynecology

## 2022-07-27 ENCOUNTER — Inpatient Hospital Stay (HOSPITAL_COMMUNITY)
Admission: AD | Admit: 2022-07-27 | Discharge: 2022-07-28 | Disposition: A | Discharge status: Home / Self Care | Payer: Medicaid Other | Attending: Obstetrics & Gynecology | Admitting: Obstetrics & Gynecology

## 2022-07-27 DIAGNOSIS — O009 Unspecified ectopic pregnancy without intrauterine pregnancy: Secondary | ICD-10-CM | POA: Insufficient documentation

## 2022-07-27 DIAGNOSIS — Z3A01 Less than 8 weeks gestation of pregnancy: Secondary | ICD-10-CM | POA: Diagnosis not present

## 2022-07-27 DIAGNOSIS — O26891 Other specified pregnancy related conditions, first trimester: Secondary | ICD-10-CM | POA: Diagnosis present

## 2022-07-27 LAB — COMPREHENSIVE METABOLIC PANEL
ALT: 19 U/L (ref 0–44)
AST: 18 U/L (ref 15–41)
Albumin: 3.6 g/dL (ref 3.5–5.0)
Alkaline Phosphatase: 71 U/L (ref 38–126)
Anion gap: 9 (ref 5–15)
BUN: 8 mg/dL (ref 6–20)
CO2: 22 mmol/L (ref 22–32)
Calcium: 9 mg/dL (ref 8.9–10.3)
Chloride: 105 mmol/L (ref 98–111)
Creatinine, Ser: 0.74 mg/dL (ref 0.44–1.00)
GFR, Estimated: >60 mL/min (ref >60)
Glucose, Bld: 102 mg/dL — ABNORMAL HIGH (ref 70–99)
Potassium: 3.8 mmol/L (ref 3.5–5.1)
Sodium: 136 mmol/L (ref 135–145)
Total Bilirubin: 0.4 mg/dL (ref 0.3–1.2)
Total Protein: 6.7 g/dL (ref 6.5–8.1)

## 2022-07-27 MED ORDER — METHOTREXATE FOR ECTOPIC PREGNANCY
50.0000 mg/m2 | Freq: Once | INTRAMUSCULAR | Status: AC
  Administered 2022-07-28: 110 mg via INTRAMUSCULAR
  Filled 2022-07-27: qty 4.4

## 2022-07-27 NOTE — MAU Note (Signed)
Pt says she was here yesterday - and was suppose to come back this am Someone called her- told her to come in for Methotrexate  Feels pain on right lower abd  and lower back  No VB

## 2022-07-27 NOTE — MAU Provider Note (Signed)
History     CSN: [id number]  Arrival date and time: 07/27/22 2221   None     Chief Complaint  Patient presents with  . Abdominal Pain  . Back Pain   HPI  {GYN/OB [id number]  Past Medical History:  Diagnosis Date  . ADHD (attention deficit hyperactivity disorder)   . Anxiety   . Cesarean delivery delivered 12/02/2020   Failure to progress. 5cm  . Depression   . Gestational diabetes   . Gestational diabetes mellitus, class A2 09/28/2020   [facility]/MFM Guidelines for Antenatal Testing and Sonography                       Updated  08/12/2020 with Dr. Tama High  INDICATION Growth U/S BPP weekly DELIVERY RECOMMENDATION (GA)  Diabetes   A1 - good control     A2 - good control     A2  - poor control or poor compliance    (Macrosomia or polyhydramnios)     A2/B and B-C Pregestational Type II DM    Poor control B-C or D-R-F-T  or  Type I DM    . Gestational hypertension 07/03/2020  . Gonorrhea 02/02/2018  . Headache   . Headache in pregnancy, antepartum, second trimester 07/03/2020  . Indication for care or intervention in labor or delivery 11/30/2020  . Obesity in pregnancy, antepartum 05/31/2020  . Ovarian cyst   . Positive GBS test 11/25/2020  . Pregnant 11/30/2020  . Prolonged rupture of membranes 12/02/2020  . Supervision of high risk pregnancy, antepartum 05/31/2020    Nursing Staff Provider  Office Location  [facility] Dating  Ultrasound  Language  English Anatomy US  Normal, but limited. F/U Scheduled  Flu Vaccine   Genetic Screen  NIPS: low risk female AFP:    TDaP Vaccine   09/26/20 Hgb A1C or  GTT Early   A1C 5.4 Third trimester Abnormal  Glucose, Fasting 65 - 91 mg/dL 94 High    Glucose, 1 hour 65 - 179 mg/dL 155   Glucose, 2 hour 65 - 152 mg/dL 93     COVID    Past Surgical History:  Procedure Laterality Date  . CESAREAN SECTION N/A 12/02/2020   Procedure: CESAREAN SECTION;  Surgeon: Clarnce Flock, MD;  Location: [facility] LD ORS;  Service: Obstetrics;  Laterality: N/A;     Family History  Problem Relation Age of Onset  . Hypertension Mother   . Healthy Father     Social History   Tobacco Use  . Smoking status: Never  . Smokeless tobacco: Never  Vaping Use  . Vaping Use: Never used  Substance Use Topics  . Alcohol use: Yes    Comment: few times a wk, drank yesterday 2/21  . Drug use: Yes    Frequency: 14.0 times per week    Types: Marijuana    Comment: 2-3x a day, last was 2/21 in the morning    Allergies:  Allergies  Allergen Reactions  . Nickel Rash    Medications Prior to Admission  Medication Sig Dispense Refill Last Dose  . albuterol (VENTOLIN HFA) 108 (90 Base) MCG/ACT inhaler Inhale 2 puffs into the lungs as needed.     . cetirizine (ZYRTEC) 10 MG tablet Take 10 mg by mouth daily.   not taking  . escitalopram (LEXAPRO) 5 MG tablet Take 5 mg by mouth daily. (Patient not taking: Reported on 07/20/2022)     . fluticasone (FLONASE) 50 MCG/ACT nasal spray Place 1  spray into both nostrils daily.   not taking  . Prenatal Vit-Fe Fumarate-FA (PRENATAL COMPLETE) 14-0.4 MG TABS Take 1 tablet by mouth daily. 60 tablet 0 not taking  . valACYclovir (VALTREX) 500 MG tablet Take 500 mg by mouth daily. (Patient not taking: Reported on 07/20/2022)       Review of Systems Physical Exam   Blood pressure 116/74, pulse 91, temperature 99.1 F (37.3 C), temperature source Oral, resp. rate 18, height '5\' 5"'$  (1.651 m), weight 105.3 kg, last menstrual period 06/12/2022, currently breastfeeding.  Physical Exam  MAU Course  Procedures  MDM ***  Assessment and Plan  ***  Renee Harder 07/27/2022, 10:41 PM

## 2022-07-27 NOTE — Telephone Encounter (Signed)
TC to patient to find out why she has not come to MAU for MTX today. Patient stated, "I didn't have anyone to watch my son today. I figured I would just try to come sometime tomorrow." Patient made aware of the life-threatening situation she is placing herself in by not coming sooner for MTX, but that we cannot make her do anything against her will. Advised to call 911 if she experienced any LOC, heavier vaginal bleeding or increased abdominal pain. Asked patient to give an estimated time of arrival to MAU tomorrow (07/28/2022). She stated, "I will come after the babysitter comes to pick up my son. So, 1000 or before." Patient made aware that this TC would be documented and the MAU providers on staff tomorrow would be made aware of her coming tomorrow. Patient verbalized an understanding of the plan of care and agrees.   Laury Deep, CNM

## 2022-07-28 DIAGNOSIS — Z3A01 Less than 8 weeks gestation of pregnancy: Secondary | ICD-10-CM

## 2022-07-28 DIAGNOSIS — O009 Unspecified ectopic pregnancy without intrauterine pregnancy: Secondary | ICD-10-CM

## 2022-07-31 ENCOUNTER — Ambulatory Visit: Payer: Medicaid Other

## 2022-08-01 ENCOUNTER — Inpatient Hospital Stay (HOSPITAL_COMMUNITY)
Admission: AD | Admit: 2022-08-01 | Discharge: 2022-08-01 | Disposition: A | Discharge status: Home / Self Care | Payer: Medicaid Other | Attending: Maternal & Fetal Medicine | Admitting: Maternal & Fetal Medicine

## 2022-08-01 DIAGNOSIS — O3680X0 Pregnancy with inconclusive fetal viability, not applicable or unspecified: Secondary | ICD-10-CM | POA: Insufficient documentation

## 2022-08-01 DIAGNOSIS — Z3A01 Less than 8 weeks gestation of pregnancy: Secondary | ICD-10-CM | POA: Diagnosis not present

## 2022-08-01 DIAGNOSIS — O009 Unspecified ectopic pregnancy without intrauterine pregnancy: Secondary | ICD-10-CM | POA: Diagnosis present

## 2022-08-01 LAB — HCG, QUANTITATIVE, PREGNANCY: hCG, Beta Chain, Quant, S: 11167 m[IU]/mL — ABNORMAL HIGH (ref <5)

## 2022-08-01 NOTE — MAU Note (Signed)
Bethany Avery is a 22 y.o. at 3w1dhere in MAU reporting: here for day 4 HCG, post  MTX.  Doing ok.  Some mild cramping. No bleeding.  Some nausea.  Onset of complaint: ongoing Pain score: 2-upper abd Vitals:   08/01/22 1642  BP: 120/70  Pulse: 93  Resp: 18  Temp: 99 F (37.2 C)  SpO2: 100%     Lab orders placed from triage:   HCG per APP

## 2022-08-01 NOTE — MAU Provider Note (Signed)
  S [name] is a 22 y.o. [phone] patient who presents to MAU today for repeat bHCG. Patient is day 4 s/p methotrexate for a right cornual ectopic pregnancy. She denies pain or bleeding. She has no complaints or concerns today.  O BP 120/70 (BP Location: Right Arm)   Pulse 93   Temp 99 F (37.2 C) (Oral)   Resp 18   Ht 5\' 5"  (1.651 m)   Wt 103.1 kg   LMP 06/12/2022   SpO2 100%   BMI 37.81 kg/m   Physical Exam Vitals and nursing note reviewed.  Constitutional:      General: She is not in acute distress.    Appearance: She is obese.  Eyes:     Extraocular Movements: Extraocular movements intact.     Pupils: Pupils are equal, round, and reactive to light.  Cardiovascular:     Rate and Rhythm: Normal rate.  Pulmonary:     Effort: Pulmonary effort is normal. No respiratory distress.  Musculoskeletal:     Cervical back: Normal range of motion.  Neurological:     General: No focal deficit present.     Mental Status: She is alert and oriented to person, place, and time.  Psychiatric:        Mood and Affect: Mood normal.        Behavior: Behavior normal.    A Medical screening exam complete 1. Ectopic pregnancy without intrauterine pregnancy, unspecified location    P Discharge from MAU in stable condition. Will call patient with results Day 7 post MTX bHCG at [facility] on Tuesday 3/11 Return to MAU sooner or as needed for new/worsening symptoms   Renee Harder, CNM 08/01/2022 5:55 PM    Reviewed bHCG with Dr. Mikel Cella given increase to >11,000. Patient okay to follow up in office on 3/12 for day 7 repeat bHCG as she is stable without concerns for ruptured ectopic at this time. I called and spoke with patient via telephone at 1845 regarding results. Strict return precautions reviewed. Patient verbalizes understanding.   Renee Harder, CNM 08/01/22, 6:48 PM

## 2022-08-04 ENCOUNTER — Other Ambulatory Visit: Payer: Self-pay

## 2022-08-04 ENCOUNTER — Ambulatory Visit (INDEPENDENT_AMBULATORY_CARE_PROVIDER_SITE_OTHER): Payer: Medicaid Other | Admitting: Family Medicine

## 2022-08-04 VITALS — BP 115/65 | HR 89 | Ht 65.0 in | Wt 227.0 lb

## 2022-08-04 DIAGNOSIS — O009 Unspecified ectopic pregnancy without intrauterine pregnancy: Secondary | ICD-10-CM

## 2022-08-04 DIAGNOSIS — Z3A01 Less than 8 weeks gestation of pregnancy: Secondary | ICD-10-CM

## 2022-08-04 DIAGNOSIS — O0091 Unspecified ectopic pregnancy with intrauterine pregnancy: Secondary | ICD-10-CM

## 2022-08-04 LAB — BETA HCG QUANT (REF LAB): hCG Quant: 5666 m[IU]/mL

## 2022-08-04 NOTE — Progress Notes (Unsigned)
Pt is here today for day 7 beta s/p MTX on 07/28/22.  Pt denies pain and VB.  Pt states she has been feeling nauseous since her last beta level.  Pt advised that a provider from the hospital will call with results and f/u as the office closes at [place] verbalized understanding with no further questions.  Dr. Nehemiah Settle notified of pending results.    Bethany Avery  08/04/22

## 2022-08-05 ENCOUNTER — Institutional Professional Consult (permissible substitution): Payer: Medicaid Other | Admitting: Plastic Surgery

## 2022-08-05 ENCOUNTER — Telehealth: Payer: Self-pay

## 2022-08-05 NOTE — Telephone Encounter (Addendum)
-----   Message from Truett Mainland, DO sent at 08/05/2022  8:27 AM EDT ----- This was a stat HCG from yesterday afternoon - HCG dropped by 50%. Rpt HCG in 1 week.  Pt notified of results and provider's recommendation.  Pt informed when to go to MAU for evaluation and to call the office with concerns or questions.   Pt agreed to lab appts 08/11/22 and 08/18/22 to trend beta to zero.    Frances Nickels  08/05/22

## 2022-08-05 NOTE — Progress Notes (Signed)
HCG down to 5666, which is approximately a 50% drop. Repeat HCG in 1 week.

## 2022-08-10 ENCOUNTER — Other Ambulatory Visit: Payer: Self-pay

## 2022-08-10 DIAGNOSIS — O00101 Right tubal pregnancy without intrauterine pregnancy: Secondary | ICD-10-CM

## 2022-08-11 ENCOUNTER — Other Ambulatory Visit: Payer: Medicaid Other

## 2022-08-18 ENCOUNTER — Other Ambulatory Visit: Payer: Self-pay

## 2022-08-19 ENCOUNTER — Other Ambulatory Visit: Payer: Medicaid Other

## 2022-08-25 ENCOUNTER — Ambulatory Visit (HOSPITAL_COMMUNITY)
Admission: EM | Admit: 2022-08-25 | Discharge: 2022-08-25 | Disposition: A | Discharge status: Home / Self Care | Payer: Medicaid Other | Attending: Behavioral Health | Admitting: Behavioral Health

## 2022-08-25 ENCOUNTER — Encounter (HOSPITAL_COMMUNITY): Payer: Self-pay | Admitting: Behavioral Health

## 2022-08-25 DIAGNOSIS — Z5900 Homelessness unspecified: Secondary | ICD-10-CM | POA: Insufficient documentation

## 2022-08-25 DIAGNOSIS — X789XXA Intentional self-harm by unspecified sharp object, initial encounter: Secondary | ICD-10-CM | POA: Insufficient documentation

## 2022-08-25 DIAGNOSIS — F1290 Cannabis use, unspecified, uncomplicated: Secondary | ICD-10-CM | POA: Insufficient documentation

## 2022-08-25 DIAGNOSIS — F4323 Adjustment disorder with mixed anxiety and depressed mood: Secondary | ICD-10-CM | POA: Insufficient documentation

## 2022-08-25 DIAGNOSIS — Z9151 Personal history of suicidal behavior: Secondary | ICD-10-CM | POA: Insufficient documentation

## 2022-08-25 DIAGNOSIS — R45851 Suicidal ideations: Secondary | ICD-10-CM | POA: Insufficient documentation

## 2022-08-25 DIAGNOSIS — S41112A Laceration without foreign body of left upper arm, initial encounter: Secondary | ICD-10-CM | POA: Insufficient documentation

## 2022-08-25 NOTE — Discharge Instructions (Addendum)
Safety Plan [facility] Amend will reach out to her sister Bethany Avery [phone]), call 911 or call mobile crisis, or go to nearest emergency room if condition worsens or if suicidal thoughts become active Patients' will follow up with [facility] 2nd floor during walk-in hours for outpatient psychiatric services (therapy/medication management).  The suicide prevention education provided includes the following: Suicide risk factors Suicide prevention and interventions National Suicide Hotline telephone number [facility] assessment telephone number [place] and/or Residential Mobile Crisis Unit telephone number Request made of family/significant other to:  Bethany Avery (sister) Remove weapons (e.g., guns, rifles, knives), all items previously/currently identified as safety concern.   Remove drugs/medications (over the counter, prescriptions, illicit drugs), all items previously/currently identified as a safety concern.     [facility]: Outpatient psychiatric Services  New Patient Assessment and Therapy Walk-in Monday thru Thursday 8:00 am first come first serve until slots are full Every Friday from 1:00 pm to 4:00 pm first come first serve until slots are full  New Patient Psychiatric Medication Management Monday thru Friday from 8:00 am to 11:00 am first come first served until slots are full  For all walk-ins we ask that you arrive by 7:15 am because patients will be seen in there order of arrival.   Availability is limited, and therefore you may not be seen on the same day that you walk in.  Our goal is to serve and meet the needs of our community to the best of our ability.    Based on what you have shared, a list of resources for outpatient therapy and psychiatry is provided below to get you started back on treatment.  It is imperative that you follow through with treatment within 5-7 days  from the day of discharge to prevent any further risk to your safety or mental well-being.  You are not limited to the list provided.  In case of an urgent crisis, you may contact the Mobile Crisis Unit with [facility] at [phone].          Outpatient Services for Therapy and Medication Management for [facility] [place], [place], [address] [phone] phone  New Patient Assessment/Therapy Walk-ins Monday and Wednesday: 8am until slots are full. Every 1st and 2nd Friday: 1pm - 5pm  NO ASSESSMENT/THERAPY WALK-INS ON [place]  New Patient Psychiatry/Medication Management Walk-ins Monday-Friday: 8am-11am  For all walk-ins, we ask that you arrive by 7:30am because patient will be seen in the order of arrival.  Availability is limited; therefore, you may not be seen on the same day that you walk-in.  Our goal is to serve and meet the needs of our community to the best of our ability.   [facility] [address] [place], [place], [address] [phone] phone  [facility], [place], [place], [address] [phone] phone [phone] phone ([address], AmeriHealth, Anthem/Elevance, BCBS, [place], [place], ComPsych, Healthy [place], [place], [place], [place], [place], [employer], [place], Out of Network)  [employer] [facility], [address] [place], [place], [address] [phone] phone ([place], Anthem/Elevance, [employer] Options/[place], [place], [place], [place], [place], [place], [employer], [place], [place], [place], [facility])  [place] [address] [place], [place], [address] [phone] phone (Medicaid, ask about other insurance)  [facility] [place] [phone]  [place], [place], [address] [phone]  phone [phone] fax ([address] [place] , [place], [place], [place], [facility], TRICARE, Self-Pay)  Evangeline Dakin [place] [place], [place], [address] [phone] phone ([address] Anthem/Elevance, [place], [place], [place], [place], [place], [employer], [place], [place], [place], [facility])  [place] - 6-8 MONTH WAIT FOR THERAPY; [facility] MEDICATION MANAGEMENT [address], [place], [place], [address] [phone] phone ([address] [place], [place], [place], [place], Friday Health Plans, [place], [place], [place], [place], [place], [place], Tricare, UHC, [employer], [place])  [facility] [address] [place], [place], [address] [phone] phone  [place] [address], [place], [place], [address] [phone] phone  [facility] [address], [place], [place], [address] [phone] phone  [facility] [place] [address] [place], [address] [phone] phone  [facility], [place] [address], [place], [address] [phone] phone  [facility] [address] [place], [place], [address] [phone] phone [phone] fax  [facility] [address] [place], [place], [address] [phone] phone [phone] fax  [facility] [address] [place], [place], [address] [phone] phone  Jinny Blossom [address] [place], [place], [address]  [phone] phone  [facility]  (Adults Only) [address] [place], [place], [address]  [phone] phone [phone] fax

## 2022-08-25 NOTE — Progress Notes (Signed)
   08/25/22 1541  [place] (Walk-ins at [facility] only)  How Did You Hear About [place]? Family/Friend (girlfriend called [employer])  What Is the Reason for Your Visit/Call Today? Pt is a 22 yo female who presented voluntarily via [employer] due to worsening depression and periodica self-harm vua superficial cutting. Pt denied current SI, HI, recent NSSH, AVH and paranoia. Pt reported some passive SI in the form of "thinkgin I might be better off dead" at times with the last occurrance happening  within the last week. Pt reported use of cannabis daily and use of alcohol "every few days." Pt reported that she has been prescribed and taking anti-depressants since the birth of her son about 2 years ago. Pt stated that about a month ago she stopped taking her medication due to finding out she was pregnant. Pt's pregnancy was an ectopic pregnancy and she is no longer pregnant. Pt wants to return to her prescibed medications. Pt sees a virtual OP therapist weekly but wants to see someone "in- person." Pt has been associated with [facility] OP services in the past.  How Long Has This Been Causing You Problems? > than 6 months  Have You Recently Had Any Thoughts About Hurting Yourself? No  Are You Planning to Commit Suicide/Harm Yourself At This time? No  Have you Recently Had Thoughts About [place]? No  Are You Planning To Harm Someone At This Time? No  Are you currently experiencing any auditory, visual or other hallucinations? No  Have You Used Any Alcohol or Drugs in the Past 24 Hours? Yes  How long ago did you use Drugs or Alcohol? cannabis today  What Did You Use and How Much? unknown amount  Do you have any current medical co-morbidities that require immediate attention? No  Clinician description of patient physical appearance/behavior: calm, tearful, cooperative, alert and feeuly oriented. depressed mood and tearful flat affect which was congruent. speech and movement were wintin normal limits. dressed  casually and neatly.  What Do You Feel Would Help You the Most Today? Treatment for Depression or other mood problem  If access to [facility] was not available, would you have sought care in the Emergency Department? No  Determination of Need Routine (7 days) (Per Lou Cal NP pt is psychiatrically cleared. Information given regarding [facility] OP services (2nd floor))  Options For Referral Medication Management;Outpatient Therapy

## 2022-08-25 NOTE — ED Provider Notes (Signed)
Behavioral Health Urgent Care Medical Screening Exam  Patient Name: Bethany Avery MRN: [id number] Date of Evaluation: 08/25/22 Chief Complaint:  "I need to get back on meds, my depression got worse" Diagnosis:  Final diagnoses:  Adjustment disorder with mixed anxiety and depressed mood   History of Present Illness: Bethany Avery is a 22 y.o. female patient with a past psychiatric history of adjustment disorder with mixed anxiety and depressed mood, insomnia, and suicidal behavior by intentional overdose who presented voluntarily to [facility] via [employer] for worsening depression and NSSIB.   Patient assessed face-to-face by this provider and chart reviewed on 08/25/22. Counselor Faylene Kurtz present during assessment. On evaluation, Bethany Avery is seated in assessment area in no acute distress. Patient is alert and oriented x4, calm, cooperative, and pleasant during assessment. Speech is clear and coherent, normal rate and volume. Patient appears well-groomed. Eye contact is good. Mood is depressed with congruent affect. Thought process is coherent with logical thought content. Patient denies current suicidal and homicidal ideations and easily contracts verbally for safety, stating "I don't want to die, I have a son to live for." Patient reports experiencing passive suicidal ideations in the form of "thinking I might be better off dead" at times within the last week. Patient reports a history of 2 suicide attempts "years ago" and in June 2023 by overdose. Patient reports a history of NSSIB by cutting "for relief and as a distraction to take the emotional pain away." Patient states she superficially cuts "every few days," last time was today to left upper arm. Superficial cut marks/abrasions noted to left upper arm during assessment with no active bleeding or open wounds. Patient reports a past inpatient psychiatric hospitalization in June 2023. Per chart review, patient was seen at  [facility]19/[facility] for suicidal behavior via intentional overdose and discharged. Patient denies auditory and visual hallucinations. Patient denies symptoms of paranoia. Patient is able to converse coherently with goal-directed thoughts and no distractibility or preoccupation. Objectively, there is no evidence of psychosis/mania, delusional thinking, or indication that patient is responding to internal/external stimuli.   Patient endorses fair sleep and appetite. Patient states she has been living with her girlfriend but is currently homeless due to recent conflict with her girlfriend and girlfriend's brother. Patient states her girlfriend called the police today after she cut herself. Patient states that she has been prescribed an antidepressant since the birth of her son about 2 years ago, but stopped taking the medication 1 month ago due to finding out she was pregnant again. Patient is unsure of the name/dose of the medication. Patient states the pregnancy was ectopic and that she is no longer pregnant and wants to start her medication again. Patient states since she has been off the medication, her depression has worsened. Patient reports she has a difficult relationship with her mother and just wants her mother to be supportive of her. Patient states her mother is currently taking care of her son. Patient reports difficulty maintaining employment due to cost and lack of childcare, stating she was fired last month due to call outs from not having a babysitter. Patient states she has an interview on Friday at a hospital and another interview on 09/09/22 for a work-from-home job. Patient states she is currently on the waiting list for the [employer] for housing assistance. Patient states she receives weekly outpatient therapy via Zoom, but would like to switch to in-person visits more frequently. Patient would also like to discuss anger management issues  in her upcoming therapy sessions. Patient endorses  marijuana use of 3-4 blunts daily since age 25 y.o. and alcohol use "every few days." Past reports she last drank alcohol on Saturday. Patient denies use of other illicit substances. Patient denies access to weapons.   Patient gave verbal consent for provider to contact her girlfriend [name] [phone]) to obtain collateral information. [name] confirms that she called [employer] today due to patient cutting herself. [name] states, "she needs self-love, her depression has gotten worse, we been together 3 years." Attempted to continue conversation and safety plan with [name], but [place] placed the call on speaker phone and a man began talking stating patient could not return to the home. The man and [name] then stated they were not willing to participate in safety planning. Due to privacy concerns with the unidentified man during call, this provider thanked the parties for their information and ended the call.   Patient gave verbal consent for provider to contact her sister [name] [phone]) for safety planning. Jasmine states patient can return to her house today. Discussed the following safety plan:  Safety Plan [name] will reach out to her sister [name] [phone]), call 911 or call mobile crisis, or go to nearest emergency room if condition worsens or if suicidal thoughts become active Patients' will follow up with [facility] 2nd floor during walk-in hours for outpatient psychiatric services (therapy/medication management).  The suicide prevention education provided includes the following: Suicide risk factors Suicide prevention and interventions National Suicide Hotline telephone number [facility] assessment telephone number [place] and/or Residential Mobile Crisis Unit telephone number Request made of family/significant other to:  [name] (sister) Remove weapons (e.g., guns, rifles, knives),  all items previously/currently identified as safety concern.   Remove drugs/medications (over the counter, prescriptions, illicit drugs), all items previously/currently identified as a safety concern.   Patient's sister is in agreement with safety plan. Discussed patient following-up with outpatient psychiatric services for therapy and medication management. Discussed that patient can come to [facility] 2nd floor during walk-in hours for therapy and medication management as early as tomorrow morning. Discussed further outpatient psychiatric services available for patient that are listed in AVS. Patient and her sister are in agreement with plan of care.  At this time, Fiora Majcher and her sister are educated and verbalize understanding of mental health resources and other crisis services in the community. They are instructed to call 911 and present to the nearest emergency room should patient experience any suicidal/homicidal ideation, auditory/visual/hallucinations, or detrimental worsening of her mental health condition. They were a also advised by Probation officer that patient could call the toll-free phone on back of  insurance card to assist with identifying in network services and agencies or the number on back of Medicaid card to speak with care coordinator.  Yehuda Budd, RN/AC will call cab to take patient to her sister's home.  [place] ED from 08/25/2022 in [facility] Admission (Discharged) from 07/26/2022 in [facility] ED from 04/19/2022 in [facility] Department at [place] CATEGORY Moderate Risk No Risk No Risk       Psychiatric Specialty Exam  Presentation  General Appearance:Appropriate for Environment; Casual  Eye Contact:Good  Speech:Clear and Coherent; Normal Rate  Speech Volume:Normal  Handedness:Right   Mood and Affect  Mood: Depressed  Affect: Congruent   Thought Process   Thought Processes: Coherent; Goal Directed  Descriptions of Associations:Intact  Orientation:Full (Time, Place and Person)  Thought Content:Logical    Hallucinations:None  Ideas of Reference:None  Suicidal Thoughts:No  Homicidal Thoughts:No   Sensorium  Memory: Immediate Good; Recent Good; Remote Good  Judgment: Fair  Insight: Fair   Community education officer  Concentration: Good  Attention Span: Good  Recall: Good  Fund of Knowledge: Good  Language: Good   Psychomotor Activity  Psychomotor Activity: Normal   Assets  Assets: Communication Skills; Desire for Improvement; Financial Resources/Insurance; Housing; Physical Health; Resilience; Social Support; Transportation   Sleep  Sleep: Fair  Number of hours:  8   Physical Exam: Physical Exam Vitals and nursing note reviewed.  Constitutional:      General: She is not in acute distress.    Appearance: Normal appearance. She is not ill-appearing.  HENT:     Nose: Nose normal.  Eyes:     General:        Right eye: No discharge.        Left eye: No discharge.     Conjunctiva/sclera: Conjunctivae normal.  Cardiovascular:     Rate and Rhythm: Tachycardia present.  Pulmonary:     Effort: Pulmonary effort is normal. No respiratory distress.  Musculoskeletal:        General: Normal range of motion.     Cervical back: Normal range of motion.  Skin:    General: Skin is warm and dry.  Neurological:     General: No focal deficit present.     Mental Status: She is alert and oriented to person, place, and time. Mental status is at baseline.  Psychiatric:        Attention and Perception: Attention and perception normal.        Mood and Affect: Affect normal. Mood is depressed.        Speech: Speech normal.        Behavior: Behavior normal. Behavior is cooperative.        Thought Content: Thought content normal.        Cognition and Memory: Cognition and memory normal.        Judgment: Judgment  normal.    Review of Systems  Constitutional: Negative.   HENT: Negative.    Eyes: Negative.   Respiratory: Negative.    Cardiovascular: Negative.   Gastrointestinal: Negative.   Genitourinary: Negative.   Musculoskeletal: Negative.   Skin: Negative.   Neurological: Negative.   Endo/Heme/Allergies: Negative.   Psychiatric/Behavioral:  Positive for depression. Negative for hallucinations, memory loss and suicidal ideas. The patient is not nervous/anxious and does not have insomnia.    Blood pressure 139/86, pulse (!) 103, temperature 99 F (37.2 C), resp. rate 18, last menstrual period 06/12/2022, SpO2 99 %, currently breastfeeding. There is no height or weight on file to calculate BMI.  Musculoskeletal: Strength & Muscle Tone: within normal limits Gait & Station: normal Patient leans: N/A   [place] [facility] Discharge Disposition for Follow up and Recommendations: Based on my evaluation the patient does not appear to have an emergency medical condition and can be discharged with resources and follow up care in outpatient services for Medication Management and [place], NP 08/25/2022, 5:18 PM

## 2022-08-26 ENCOUNTER — Telehealth: Payer: Medicaid Other

## 2022-08-31 ENCOUNTER — Telehealth: Payer: Self-pay | Admitting: *Deleted

## 2022-08-31 NOTE — Telephone Encounter (Signed)
I called Bethany Avery and informed her recommendations per Dr. Alvester Morin and she agreed to lab appt 09/02/22 to check bhcg level. She understands it needs to be followed until 0. She states she is doing well and thinks she just had her period.  Nancy Fetter

## 2022-08-31 NOTE — Telephone Encounter (Signed)
-----   Message from Federico Flake, MD sent at 08/28/2022  1:15 PM EDT ----- Regarding: FW: MAU Patient on Monday 07/27/2022 It looks like this patient did not show for her repeat BHCG after 3/12  Can you reach out to the patient? She has a cornual ectopic and we need to follow her BHCG to zero.   ----- Message ----- From: Raelyn Mora, CNM Sent: 07/27/2022   7:33 PM EDT To: Rolm Bookbinder, CNM; # Subject: FW: MAU Patient on Monday 07/27/2022             Please see the message sent to [place] by Thressa Sheller, CNM. ----- Message ----- From: Armando Reichert, CNM Sent: 07/26/2022   2:03 PM EST To: Raelyn Mora, CNM; Gerrit Heck, CNM; # Subject: MAU Patient on Monday 07/27/2022                 This patient has a cornual ectopic pregnancy. Dr. Para March came and talked with her and we had a long discussion. She felt like she needed more time to process so she will be coming for methotrexate on Monday 07/27/2022 in the morning.   Please see mine and Dr. Lianne Bushy notes for detailed information.   Thanks!  Herbert Seta

## 2022-09-02 ENCOUNTER — Other Ambulatory Visit: Payer: Medicaid Other

## 2022-09-02 ENCOUNTER — Encounter: Payer: Self-pay | Admitting: Obstetrics and Gynecology

## 2022-09-04 ENCOUNTER — Other Ambulatory Visit: Payer: Medicaid Other

## 2022-09-05 ENCOUNTER — Encounter (HOSPITAL_COMMUNITY): Payer: Self-pay | Admitting: Obstetrics & Gynecology

## 2022-09-05 ENCOUNTER — Inpatient Hospital Stay (HOSPITAL_COMMUNITY)
Admission: AD | Admit: 2022-09-05 | Discharge: 2022-09-07 | DRG: 819 | Disposition: A | Discharge status: Home / Self Care | Payer: Medicaid Other | Attending: Obstetrics and Gynecology | Admitting: Obstetrics and Gynecology

## 2022-09-05 ENCOUNTER — Inpatient Hospital Stay (HOSPITAL_COMMUNITY): Payer: Medicaid Other | Admitting: Anesthesiology

## 2022-09-05 ENCOUNTER — Other Ambulatory Visit: Payer: Self-pay

## 2022-09-05 ENCOUNTER — Encounter (HOSPITAL_COMMUNITY)
Admission: AD | Disposition: A | Discharge status: Home / Self Care | Payer: Self-pay | Source: Home / Self Care | Attending: Obstetrics & Gynecology

## 2022-09-05 ENCOUNTER — Inpatient Hospital Stay (HOSPITAL_COMMUNITY): Payer: Medicaid Other

## 2022-09-05 DIAGNOSIS — F419 Anxiety disorder, unspecified: Secondary | ICD-10-CM | POA: Diagnosis present

## 2022-09-05 DIAGNOSIS — Z9109 Other allergy status, other than to drugs and biological substances: Secondary | ICD-10-CM | POA: Diagnosis not present

## 2022-09-05 DIAGNOSIS — F909 Attention-deficit hyperactivity disorder, unspecified type: Secondary | ICD-10-CM | POA: Diagnosis present

## 2022-09-05 DIAGNOSIS — Z3A12 12 weeks gestation of pregnancy: Secondary | ICD-10-CM | POA: Diagnosis not present

## 2022-09-05 DIAGNOSIS — O008 Other ectopic pregnancy without intrauterine pregnancy: Secondary | ICD-10-CM | POA: Diagnosis present

## 2022-09-05 DIAGNOSIS — R109 Unspecified abdominal pain: Secondary | ICD-10-CM | POA: Diagnosis present

## 2022-09-05 DIAGNOSIS — F32A Depression, unspecified: Secondary | ICD-10-CM | POA: Diagnosis present

## 2022-09-05 DIAGNOSIS — Z8759 Personal history of other complications of pregnancy, childbirth and the puerperium: Secondary | ICD-10-CM | POA: Diagnosis present

## 2022-09-05 DIAGNOSIS — O00101 Right tubal pregnancy without intrauterine pregnancy: Secondary | ICD-10-CM

## 2022-09-05 DIAGNOSIS — Z9889 Other specified postprocedural states: Secondary | ICD-10-CM | POA: Diagnosis not present

## 2022-09-05 DIAGNOSIS — E119 Type 2 diabetes mellitus without complications: Secondary | ICD-10-CM | POA: Diagnosis present

## 2022-09-05 DIAGNOSIS — N930 Postcoital and contact bleeding: Secondary | ICD-10-CM | POA: Diagnosis present

## 2022-09-05 DIAGNOSIS — A749 Chlamydial infection, unspecified: Secondary | ICD-10-CM

## 2022-09-05 DIAGNOSIS — O34219 Maternal care for unspecified type scar from previous cesarean delivery: Secondary | ICD-10-CM | POA: Diagnosis present

## 2022-09-05 DIAGNOSIS — Z98891 History of uterine scar from previous surgery: Secondary | ICD-10-CM

## 2022-09-05 DIAGNOSIS — Z8632 Personal history of gestational diabetes: Secondary | ICD-10-CM

## 2022-09-05 DIAGNOSIS — O26899 Other specified pregnancy related conditions, unspecified trimester: Secondary | ICD-10-CM

## 2022-09-05 HISTORY — DX: Chlamydial infection, unspecified: A74.9

## 2022-09-05 HISTORY — PX: UNILATERAL SALPINGECTOMY: SHX6160

## 2022-09-05 HISTORY — PX: LAPAROTOMY: SHX154

## 2022-09-05 LAB — URINALYSIS, ROUTINE W REFLEX MICROSCOPIC
Bacteria, UA: NONE SEEN
Bilirubin Urine: NEGATIVE
Glucose, UA: NEGATIVE mg/dL
Ketones, ur: 20 mg/dL — AB
Leukocytes,Ua: NEGATIVE
Nitrite: NEGATIVE
Protein, ur: 30 mg/dL — AB
Specific Gravity, Urine: 1.029 (ref 1.005–1.030)
pH: 6 (ref 5.0–8.0)

## 2022-09-05 LAB — WET PREP, GENITAL
Sperm: NONE SEEN
Trich, Wet Prep: NONE SEEN
WBC, Wet Prep HPF POC: >=10 — AB (ref <10)
Yeast Wet Prep HPF POC: NONE SEEN

## 2022-09-05 LAB — CBC
HCT: 41.1 % (ref 36.0–46.0)
HCT: 41.3 % (ref 36.0–46.0)
Hemoglobin: 13.3 g/dL (ref 12.0–15.0)
Hemoglobin: 14 g/dL (ref 12.0–15.0)
MCH: 30.2 pg (ref 26.0–34.0)
MCH: 31.5 pg (ref 26.0–34.0)
MCHC: 32.4 g/dL (ref 30.0–36.0)
MCHC: 33.9 g/dL (ref 30.0–36.0)
MCV: 93.4 fL (ref 80.0–100.0)
MCV: 93 fL (ref 80.0–100.0)
Platelets: 392 10*3/uL (ref 150–400)
Platelets: 351 10*3/uL (ref 150–400)
RBC: 4.4 MIL/uL (ref 3.87–5.11)
RBC: 4.44 MIL/uL (ref 3.87–5.11)
RDW: 12.6 % (ref 11.5–15.5)
RDW: 12.7 % (ref 11.5–15.5)
WBC: 9.5 10*3/uL (ref 4.0–10.5)
WBC: 17.3 10*3/uL — ABNORMAL HIGH (ref 4.0–10.5)
nRBC: 0 % (ref 0.0–0.2)
nRBC: 0 % (ref 0.0–0.2)

## 2022-09-05 LAB — TYPE AND SCREEN
ABO/RH(D): A POS
Antibody Screen: NEGATIVE

## 2022-09-05 LAB — HCG, QUANTITATIVE, PREGNANCY: hCG, Beta Chain, Quant, S: 616 m[IU]/mL — ABNORMAL HIGH (ref <5)

## 2022-09-05 LAB — GLUCOSE, CAPILLARY: Glucose-Capillary: 115 mg/dL — ABNORMAL HIGH (ref 70–99)

## 2022-09-05 LAB — POCT PREGNANCY, URINE: Preg Test, Ur: POSITIVE — AB

## 2022-09-05 SURGERY — LAPAROTOMY, EXPLORATORY
Anesthesia: General | Site: Abdomen | Laterality: Right

## 2022-09-05 MED ORDER — ALUM & MAG HYDROXIDE-SIMETH 200-200-20 MG/5ML PO SUSP
30.0000 mL | ORAL | Status: DC | PRN
  Administered 2022-09-06: 30 mL via ORAL
  Filled 2022-09-05: qty 30

## 2022-09-05 MED ORDER — ONDANSETRON HCL 4 MG/2ML IJ SOLN
4.0000 mg | Freq: Four times a day (QID) | INTRAMUSCULAR | Status: DC | PRN
  Administered 2022-09-05: 4 mg via INTRAVENOUS
  Filled 2022-09-05: qty 2

## 2022-09-05 MED ORDER — DEXAMETHASONE SODIUM PHOSPHATE 10 MG/ML IJ SOLN
INTRAMUSCULAR | Status: DC | PRN
  Administered 2022-09-05: 10 mg via INTRAVENOUS

## 2022-09-05 MED ORDER — VASOPRESSIN 20 UNIT/ML IV SOLN
INTRAVENOUS | Status: AC
  Filled 2022-09-05: qty 1

## 2022-09-05 MED ORDER — ONDANSETRON HCL 4 MG/2ML IJ SOLN
INTRAMUSCULAR | Status: DC | PRN
  Administered 2022-09-05: 4 mg via INTRAVENOUS

## 2022-09-05 MED ORDER — KETOROLAC TROMETHAMINE 30 MG/ML IJ SOLN
INTRAMUSCULAR | Status: DC | PRN
  Administered 2022-09-05: 30 mg via INTRAVENOUS

## 2022-09-05 MED ORDER — OXYCODONE HCL 5 MG PO TABS
5.0000 mg | ORAL_TABLET | ORAL | Status: DC | PRN
  Administered 2022-09-06 – 2022-09-07 (×4): 10 mg via ORAL
  Filled 2022-09-05 (×4): qty 2

## 2022-09-05 MED ORDER — MIDAZOLAM HCL 2 MG/2ML IJ SOLN
INTRAMUSCULAR | Status: DC | PRN
  Administered 2022-09-05: 2 mg via INTRAVENOUS

## 2022-09-05 MED ORDER — VASOPRESSIN 20 UNIT/ML IV SOLN
INTRAVENOUS | Status: DC | PRN
  Administered 2022-09-05: 20 mL via INTRAMUSCULAR

## 2022-09-05 MED ORDER — CHLORHEXIDINE GLUCONATE 0.12 % MT SOLN
15.0000 mL | Freq: Once | OROMUCOSAL | Status: AC
  Administered 2022-09-05: 15 mL via OROMUCOSAL
  Filled 2022-09-05: qty 15

## 2022-09-05 MED ORDER — PROPOFOL 10 MG/ML IV BOLUS
INTRAVENOUS | Status: AC
  Filled 2022-09-05: qty 20

## 2022-09-05 MED ORDER — ONDANSETRON HCL 4 MG/2ML IJ SOLN: 4.0000 mg | Freq: Four times a day (QID) | INTRAMUSCULAR | Status: DC | PRN

## 2022-09-05 MED ORDER — FENTANYL CITRATE (PF) 250 MCG/5ML IJ SOLN
INTRAMUSCULAR | Status: DC | PRN
  Administered 2022-09-05: 50 ug via INTRAVENOUS
  Administered 2022-09-05: 100 ug via INTRAVENOUS
  Administered 2022-09-05: 50 ug via INTRAVENOUS

## 2022-09-05 MED ORDER — PANTOPRAZOLE SODIUM 40 MG PO TBEC
40.0000 mg | DELAYED_RELEASE_TABLET | Freq: Every day | ORAL | Status: DC
  Administered 2022-09-06 – 2022-09-07 (×3): 40 mg via ORAL
  Filled 2022-09-05 (×3): qty 1

## 2022-09-05 MED ORDER — KETAMINE HCL 50 MG/5ML IJ SOSY
PREFILLED_SYRINGE | INTRAMUSCULAR | Status: AC
  Filled 2022-09-05: qty 5

## 2022-09-05 MED ORDER — ZOLPIDEM TARTRATE 5 MG PO TABS: 5.0000 mg | ORAL_TABLET | Freq: Every evening | ORAL | Status: DC | PRN

## 2022-09-05 MED ORDER — OXYCODONE HCL 5 MG PO TABS
ORAL_TABLET | ORAL | Status: AC
  Filled 2022-09-05: qty 1

## 2022-09-05 MED ORDER — SIMETHICONE 80 MG PO CHEW
80.0000 mg | CHEWABLE_TABLET | Freq: Four times a day (QID) | ORAL | Status: DC | PRN
  Administered 2022-09-06: 80 mg via ORAL
  Filled 2022-09-05: qty 1

## 2022-09-05 MED ORDER — ORAL CARE MOUTH RINSE: 15.0000 mL | Freq: Once | OROMUCOSAL | Status: AC

## 2022-09-05 MED ORDER — KETOROLAC TROMETHAMINE 30 MG/ML IJ SOLN
30.0000 mg | Freq: Four times a day (QID) | INTRAMUSCULAR | Status: AC
  Administered 2022-09-06 (×4): 30 mg via INTRAVENOUS
  Filled 2022-09-05 (×4): qty 1

## 2022-09-05 MED ORDER — SUGAMMADEX SODIUM 200 MG/2ML IV SOLN
INTRAVENOUS | Status: DC | PRN
  Administered 2022-09-05: 200 mg via INTRAVENOUS

## 2022-09-05 MED ORDER — TRANEXAMIC ACID-NACL 1000-0.7 MG/100ML-% IV SOLN
1000.0000 mg | Freq: Once | INTRAVENOUS | Status: AC
  Administered 2022-09-05: 1000 mg via INTRAVENOUS
  Filled 2022-09-05: qty 100

## 2022-09-05 MED ORDER — CEFAZOLIN SODIUM-DEXTROSE 2-4 GM/100ML-% IV SOLN
2.0000 g | INTRAVENOUS | Status: AC
  Administered 2022-09-05: 2 g via INTRAVENOUS
  Filled 2022-09-05: qty 100

## 2022-09-05 MED ORDER — HYDROMORPHONE HCL 1 MG/ML IJ SOLN
0.2000 mg | INTRAMUSCULAR | Status: DC | PRN
  Administered 2022-09-05: 0.6 mg via INTRAVENOUS
  Filled 2022-09-05: qty 1

## 2022-09-05 MED ORDER — LACTATED RINGERS IV SOLN: INTRAVENOUS | Status: DC

## 2022-09-05 MED ORDER — FENTANYL CITRATE (PF) 100 MCG/2ML IJ SOLN
INTRAMUSCULAR | Status: AC
  Filled 2022-09-05: qty 2

## 2022-09-05 MED ORDER — BUPIVACAINE HCL (PF) 0.5 % IJ SOLN
INTRAMUSCULAR | Status: DC | PRN
  Administered 2022-09-05: 30 mL

## 2022-09-05 MED ORDER — OXYCODONE HCL 5 MG/5ML PO SOLN: 5.0000 mg | Freq: Once | ORAL | Status: DC | PRN

## 2022-09-05 MED ORDER — IBUPROFEN 600 MG PO TABS
600.0000 mg | ORAL_TABLET | Freq: Four times a day (QID) | ORAL | Status: DC
  Administered 2022-09-06 – 2022-09-07 (×2): 600 mg via ORAL
  Filled 2022-09-05 (×2): qty 1

## 2022-09-05 MED ORDER — MENTHOL 3 MG MT LOZG
1.0000 | LOZENGE | OROMUCOSAL | Status: DC | PRN
  Administered 2022-09-06: 3 mg via ORAL
  Filled 2022-09-05: qty 9

## 2022-09-05 MED ORDER — KETAMINE HCL 10 MG/ML IJ SOLN
INTRAMUSCULAR | Status: DC | PRN
  Administered 2022-09-05: 10 mg via INTRAVENOUS
  Administered 2022-09-05: 20 mg via INTRAVENOUS

## 2022-09-05 MED ORDER — KETOROLAC TROMETHAMINE 30 MG/ML IJ SOLN
INTRAMUSCULAR | Status: AC
  Filled 2022-09-05: qty 1

## 2022-09-05 MED ORDER — BISACODYL 10 MG RE SUPP: 10.0000 mg | Freq: Every day | RECTAL | Status: DC | PRN

## 2022-09-05 MED ORDER — SENNOSIDES-DOCUSATE SODIUM 8.6-50 MG PO TABS: 1.0000 | ORAL_TABLET | Freq: Every evening | ORAL | Status: DC | PRN

## 2022-09-05 MED ORDER — ONDANSETRON HCL 4 MG PO TABS: 4.0000 mg | ORAL_TABLET | Freq: Four times a day (QID) | ORAL | Status: DC | PRN

## 2022-09-05 MED ORDER — LIDOCAINE 2% (20 MG/ML) 5 ML SYRINGE
INTRAMUSCULAR | Status: DC | PRN
  Administered 2022-09-05: 40 mg via INTRAVENOUS

## 2022-09-05 MED ORDER — CHLORHEXIDINE GLUCONATE 0.12 % MT SOLN
OROMUCOSAL | Status: AC
  Filled 2022-09-05: qty 15

## 2022-09-05 MED ORDER — ACETAMINOPHEN 500 MG PO TABS
1000.0000 mg | ORAL_TABLET | Freq: Four times a day (QID) | ORAL | Status: DC
  Administered 2022-09-06 – 2022-09-07 (×6): 1000 mg via ORAL
  Filled 2022-09-05 (×6): qty 2

## 2022-09-05 MED ORDER — MAGNESIUM CITRATE PO SOLN: 1.0000 | Freq: Once | ORAL | Status: DC | PRN

## 2022-09-05 MED ORDER — PROPOFOL 10 MG/ML IV BOLUS
INTRAVENOUS | Status: DC | PRN
  Administered 2022-09-05: 150 mg via INTRAVENOUS

## 2022-09-05 MED ORDER — OXYCODONE HCL 5 MG PO TABS: 5.0000 mg | ORAL_TABLET | Freq: Once | ORAL | Status: DC | PRN

## 2022-09-05 MED ORDER — FENTANYL CITRATE (PF) 100 MCG/2ML IJ SOLN
25.0000 ug | INTRAMUSCULAR | Status: DC | PRN
  Administered 2022-09-05 (×2): 50 ug via INTRAVENOUS

## 2022-09-05 MED ORDER — SUCCINYLCHOLINE CHLORIDE 200 MG/10ML IV SOSY
PREFILLED_SYRINGE | INTRAVENOUS | Status: DC | PRN
  Administered 2022-09-05: 120 mg via INTRAVENOUS

## 2022-09-05 MED ORDER — DOCUSATE SODIUM 100 MG PO CAPS
100.0000 mg | ORAL_CAPSULE | Freq: Two times a day (BID) | ORAL | Status: DC
  Administered 2022-09-06 – 2022-09-07 (×2): 100 mg via ORAL
  Filled 2022-09-05 (×2): qty 1

## 2022-09-05 MED ORDER — ROCURONIUM BROMIDE 10 MG/ML (PF) SYRINGE
PREFILLED_SYRINGE | INTRAVENOUS | Status: DC | PRN
  Administered 2022-09-05 (×2): 10 mg via INTRAVENOUS
  Administered 2022-09-05: 50 mg via INTRAVENOUS

## 2022-09-05 MED ORDER — BUPIVACAINE HCL (PF) 0.5 % IJ SOLN
INTRAMUSCULAR | Status: AC
  Filled 2022-09-05: qty 30

## 2022-09-05 MED ORDER — MIDAZOLAM HCL 2 MG/2ML IJ SOLN
INTRAMUSCULAR | Status: AC
  Filled 2022-09-05: qty 2

## 2022-09-05 MED ORDER — GABAPENTIN 100 MG PO CAPS
100.0000 mg | ORAL_CAPSULE | Freq: Two times a day (BID) | ORAL | Status: DC
  Administered 2022-09-06 – 2022-09-07 (×4): 100 mg via ORAL
  Filled 2022-09-05 (×4): qty 1

## 2022-09-05 MED ORDER — FENTANYL CITRATE (PF) 250 MCG/5ML IJ SOLN
INTRAMUSCULAR | Status: AC
  Filled 2022-09-05: qty 5

## 2022-09-05 MED ORDER — 0.9 % SODIUM CHLORIDE (POUR BTL) OPTIME
TOPICAL | Status: DC | PRN
  Administered 2022-09-05: 2000 mL

## 2022-09-05 SURGICAL SUPPLY — 43 items
APL SKNCLS STERI-STRIP NONHPOA (GAUZE/BANDAGES/DRESSINGS)
BAG COUNTER SPONGE SURGICOUNT (BAG) ×2 IMPLANT
BAG SPNG CNTER NS LX DISP (BAG) ×2
BENZOIN TINCTURE PRP APPL 2/3 (GAUZE/BANDAGES/DRESSINGS) ×2 IMPLANT
CANISTER SUCT 3000ML PPV (MISCELLANEOUS) ×2 IMPLANT
CELLS DAT CNTRL 66122 CELL SVR (MISCELLANEOUS) ×2 IMPLANT
DRAPE WARM FLUID 44X44 (DRAPES) IMPLANT
DRSG OPSITE POSTOP 4X10 (GAUZE/BANDAGES/DRESSINGS) ×2 IMPLANT
DRSG OPSITE POSTOP 4X8 (GAUZE/BANDAGES/DRESSINGS) IMPLANT
DURAPREP 26ML APPLICATOR (WOUND CARE) ×2 IMPLANT
GAUZE 4X4 16PLY ~~LOC~~+RFID DBL (SPONGE) ×2 IMPLANT
GAUZE SPONGE 4X4 12PLY STRL LF (GAUZE/BANDAGES/DRESSINGS) ×2 IMPLANT
GLOVE BIOGEL PI IND STRL 7.0 (GLOVE) ×6 IMPLANT
GLOVE ECLIPSE 7.0 STRL STRAW (GLOVE) ×2 IMPLANT
GOWN STRL REUS W/ TWL LRG LVL3 (GOWN DISPOSABLE) ×6 IMPLANT
GOWN STRL REUS W/TWL LRG LVL3 (GOWN DISPOSABLE) ×6
HEMOSTAT SURGICEL .5X2 ABSORB (HEMOSTASIS) IMPLANT
HEMOSTAT SURGICEL 2X14 (HEMOSTASIS) IMPLANT
KIT TURNOVER KIT B (KITS) ×2 IMPLANT
NEEDLE HYPO 22GX1.5 SAFETY (NEEDLE) ×2 IMPLANT
NS IRRIG 1000ML POUR BTL (IV SOLUTION) ×2 IMPLANT
PACK ABDOMINAL GYN (CUSTOM PROCEDURE TRAY) ×2 IMPLANT
PAD OB MATERNITY 4.3X12.25 (PERSONAL CARE ITEMS) ×2 IMPLANT
RETRACTOR WND ALEXIS 18 MED (MISCELLANEOUS) IMPLANT
RTRCTR WOUND ALEXIS 18CM MED (MISCELLANEOUS) ×2
SPECIMEN JAR MEDIUM (MISCELLANEOUS) ×2 IMPLANT
SPIKE FLUID TRANSFER (MISCELLANEOUS) IMPLANT
SPONGE T-LAP 18X18 ~~LOC~~+RFID (SPONGE) ×4 IMPLANT
STRIP CLOSURE SKIN 1/2X4 (GAUZE/BANDAGES/DRESSINGS) ×2 IMPLANT
SUT PDS AB 0 CTX 60 (SUTURE) IMPLANT
SUT PLAIN 2 0 (SUTURE)
SUT PLAIN ABS 2-0 CT1 27XMFL (SUTURE) IMPLANT
SUT VIC AB 0 CT1 18XCR BRD8 (SUTURE) IMPLANT
SUT VIC AB 0 CT1 27 (SUTURE) ×8
SUT VIC AB 0 CT1 27XBRD ANBCTR (SUTURE) ×2 IMPLANT
SUT VIC AB 0 CT1 8-18 (SUTURE)
SUT VIC AB 0 CTX 36 (SUTURE)
SUT VIC AB 0 CTX36XBRD ANBCTRL (SUTURE) IMPLANT
SUT VIC AB 4-0 KS 27 (SUTURE) ×2 IMPLANT
SUT VICRYL 0 TIES 12 18 (SUTURE) IMPLANT
SYR CONTROL 10ML LL (SYRINGE) ×2 IMPLANT
TOWEL GREEN STERILE FF (TOWEL DISPOSABLE) ×4 IMPLANT
TRAY FOLEY W/BAG SLVR 14FR (SET/KITS/TRAYS/PACK) ×2 IMPLANT

## 2022-09-05 NOTE — Progress Notes (Signed)
[facility] RN Report to floor given  Gave report to [employer]. Room: 6N03   Discussed surgery, meds given in OR and [facility], VS, IV fluids given, EBL, urine output, pain and other pertinent information. Also discussed if pt had any family or friends here or belongings with them.   Pt has a large abdominal open incision with honeycomb dsg in place, no bleeding or hematoma noted. No vaginal bleeding is noted. Ice to incision. VSS. Pain has been treated with Fentanyl and Oxycodone 5mg  as documented.   No family Is present, pt is an inmate and an Technical sales engineer is present with her and will remain with her during her stay.   Belongings sent to her room from [place] on discharge from [facility].   Pt exits my care.

## 2022-09-05 NOTE — H&P (Signed)
History     CSN: [id number]  Arrival date and time: 09/05/22 1324   Event Date/Time   First Provider Initiated Contact with Patient 09/05/22 1408      Chief Complaint  Patient presents with   Abdominal Pain   Nausea   Emesis   Vaginal Bleeding   HPI  Bethany Avery is a 22 y.o. G3P1011 at [redacted]w[redacted]d who presents for evaluation of abdominal pain and vaginal bleeding. Patient reports the pain in her abdomen started 2 days ago. Patient rates the pain as a 6/10 and has not tried anything for the pain. She is also having pain in her lower back that she rates a 8/10. She reports she had 2 days of bright red bleeding after intercourse this week but that is now dark brown. She denies any abnormal discharge. Denies any constipation, diarrhea or any urinary complaints.  On 2/22, she presented to [facility] and was found to be pregnant. She was sent for repeat HCG in MAU on 2/24 which rose appropriately and again on 2/26 with an appropriate rise. She was scheduled for a repeat ultrasound in 10-14 days for viability.  3/3: she was diagnosed with a right cornual ectopic pregnancy. She was extensively counseled by Dr. Para March at that time regarding recommended treatment regimen. At that time, patient was not ready to agree to treatment and went home.   3/4: Patient returned to MAU for MTX treatment and was set up for outpatient labs.   HCG results:   2/22: 289 2/24: 537 2/26: 1139 3/3: 4,623- diagnosed cornual ectopic  3/4: treated with MTX 3/9: 11,167, day 4 s/p MTX 3/12: 5,666 day 7 MTX  After 3/12, patient reports she had a large amount of bleeding that lasted for approximately 2 weeks. She states the bleeding stopped for a week which is when she had unprotected intercourse with her partner. She then states she had another episode of bleeding and cramping which she felt was her period returning.   Of note, she has been in and out of police custody over this time period. She was currently  back in jail and when she complained of the bleeding and pain, a pregnancy test was done and was positive yesterday.   OB History     Gravida  3   Para  1   Term  1   Preterm      AB  1   Living  1      SAB  1   IAB      Ectopic      Multiple  0   Live Births  1           Past Medical History:  Diagnosis Date   ADHD (attention deficit hyperactivity disorder)    Anxiety    Cesarean delivery delivered 12/02/2020   Failure to progress. 5cm   Depression    Gestational diabetes    Gestational diabetes mellitus, class A2 09/28/2020   [facility] Guidelines for Antenatal Testing and Sonography                       Updated  08/12/2020 with Dr. Noralee Space  INDICATION Growth U/S BPP weekly DELIVERY RECOMMENDATION (GA)  Diabetes   A1 - good control     A2 - good control     A2  - poor control or poor compliance    (Macrosomia or polyhydramnios)     A2/B and B-C Pregestational Type II DM  Poor control B-C or D-R-F-T  or  Type I DM     Gestational hypertension 07/03/2020   Gonorrhea 02/02/2018   Headache    Headache in pregnancy, antepartum, second trimester 07/03/2020   Indication for care or intervention in labor or delivery 11/30/2020   Obesity in pregnancy, antepartum 05/31/2020   Ovarian cyst    Positive GBS test 11/25/2020   Pregnant 11/30/2020   Prolonged rupture of membranes 12/02/2020   Supervision of high risk pregnancy, antepartum 05/31/2020    Nursing Staff Provider  Office Location  [facility] Dating  Ultrasound  Language  English Anatomy US  Normal, but limited. F/U Scheduled  Flu Vaccine   Genetic Screen  NIPS: low risk female AFP:    TDaP Vaccine   09/26/20 Hgb A1C or  GTT Early   A1C 5.4 Third trimester Abnormal  Glucose, Fasting 65 - 91 mg/dL 94 High    Glucose, 1 hour 65 - 179 mg/dL 563   Glucose, 2 hour 65 - 152 mg/dL 93     COVID    Past Surgical History:  Procedure Laterality Date   CESAREAN SECTION N/A 12/02/2020   Procedure: CESAREAN SECTION;   Surgeon: Venora Maples, MD;  Location: [facility] LD ORS;  Service: Obstetrics;  Laterality: N/A;    Family History  Problem Relation Age of Onset   Hypertension Mother    Healthy Father     Social History   Tobacco Use   Smoking status: Never   Smokeless tobacco: Never  Vaping Use   Vaping Use: Never used  Substance Use Topics   Alcohol use: Yes    Comment: few times a wk, drank yesterday 2/21   Drug use: Yes    Frequency: 14.0 times per week    Types: Marijuana    Comment: 2-3x a day, last was 2/21 in the morning    Allergies:  Allergies  Allergen Reactions   Nickel Rash    No medications prior to admission.    Review of Systems  Constitutional: Negative.  Negative for fatigue and fever.  HENT: Negative.    Respiratory: Negative.  Negative for shortness of breath.   Cardiovascular: Negative.  Negative for chest pain.  Gastrointestinal:  Positive for abdominal pain. Negative for constipation, diarrhea, nausea and vomiting.  Genitourinary:  Positive for vaginal bleeding. Negative for dysuria and vaginal discharge.  Neurological: Negative.  Negative for dizziness and headaches.   Physical Exam   Blood pressure (!) 142/78, pulse 74, temperature 98.8 F (37.1 C), temperature source Oral, resp. rate 14, weight 100.9 kg, last menstrual period 06/12/2022, SpO2 100 %, currently breastfeeding.  Patient Vitals for the past 24 hrs:  BP Temp Temp src Pulse Resp SpO2 Weight  09/05/22 1341 (!) 142/78 98.8 F (37.1 C) Oral 74 14 100 % 100.9 kg    Physical Exam Vitals and nursing note reviewed.  Constitutional:      General: She is not in acute distress.    Appearance: She is well-developed.  HENT:     Head: Normocephalic.  Eyes:     Pupils: Pupils are equal, round, and reactive to light.  Cardiovascular:     Rate and Rhythm: Normal rate and regular rhythm.     Heart sounds: Normal heart sounds.  Pulmonary:     Effort: Pulmonary effort is normal. No respiratory  distress.     Breath sounds: Normal breath sounds.  Abdominal:     General: Bowel sounds are normal. There is no distension.  Palpations: Abdomen is soft.     Tenderness: There is abdominal tenderness in the right lower quadrant, suprapubic area and left lower quadrant.  Skin:    General: Skin is warm and dry.  Neurological:     Mental Status: She is alert and oriented to person, place, and time.  Psychiatric:        Mood and Affect: Mood normal.        Behavior: Behavior normal.        Thought Content: Thought content normal.        Judgment: Judgment normal.    MAU Course  Procedures  Results for orders placed or performed during the hospital encounter of 09/05/22 (from the past 24 hour(s))  Urinalysis, Routine w reflex microscopic -Urine, Clean Catch     Status: Abnormal   Collection Time: 09/05/22  1:49 PM  Result Value Ref Range   Color, Urine YELLOW YELLOW   APPearance HAZY (A) CLEAR   Specific Gravity, Urine 1.029 1.005 - 1.030   pH 6.0 5.0 - 8.0   Glucose, UA NEGATIVE NEGATIVE mg/dL   Hgb urine dipstick MODERATE (A) NEGATIVE   Bilirubin Urine NEGATIVE NEGATIVE   Ketones, ur 20 (A) NEGATIVE mg/dL   Protein, ur 30 (A) NEGATIVE mg/dL   Nitrite NEGATIVE NEGATIVE   Leukocytes,Ua NEGATIVE NEGATIVE   RBC / HPF 6-10 0 - 5 RBC/hpf   WBC, UA 0-5 0 - 5 WBC/hpf   Bacteria, UA NONE SEEN NONE SEEN   Squamous Epithelial / HPF 0-5 0 - 5 /HPF   Mucus PRESENT   Pregnancy, urine POC     Status: Abnormal   Collection Time: 09/05/22  1:49 PM  Result Value Ref Range   Preg Test, Ur POSITIVE (A) NEGATIVE  CBC     Status: None   Collection Time: 09/05/22  2:18 PM  Result Value Ref Range   WBC 9.5 4.0 - 10.5 K/uL   RBC 4.40 3.87 - 5.11 MIL/uL   Hemoglobin 13.3 12.0 - 15.0 g/dL   HCT 16.1 09.6 - 04.5 %   MCV 93.4 80.0 - 100.0 fL   MCH 30.2 26.0 - 34.0 pg   MCHC 32.4 30.0 - 36.0 g/dL   RDW 40.9 81.1 - 91.4 %   Platelets 392 150 - 400 K/uL   nRBC 0.0 0.0 - 0.2 %  hCG,  quantitative, pregnancy     Status: Abnormal   Collection Time: 09/05/22  2:18 PM  Result Value Ref Range   hCG, Beta Chain, Quant, S 616 (H) <5 mIU/mL  Wet prep, genital     Status: Abnormal   Collection Time: 09/05/22  2:19 PM   Specimen: PATH Cytology Cervicovaginal Ancillary Only  Result Value Ref Range   Yeast Wet Prep HPF POC NONE SEEN NONE SEEN   Trich, Wet Prep NONE SEEN NONE SEEN   Clue Cells Wet Prep HPF POC PRESENT (A) NONE SEEN   WBC, Wet Prep HPF POC >=10 (A) <10   Sperm NONE SEEN      US OB LESS THAN 14 WEEKS WITH OB TRANSVAGINAL  Result Date: 09/05/2022 CLINICAL DATA:  Follow-up cornual ectopic pregnancy. Pelvic pain in pregnancy. EXAM: OBSTETRIC <14 WK [place] AND TRANSVAGINAL OB US TECHNIQUE: Both transabdominal and transvaginal ultrasound examinations were performed for complete evaluation of the gestation as well as the maternal uterus, adnexal regions, and pelvic cul-de-sac. Transvaginal technique was performed to assess early pregnancy. COMPARISON:  07/26/2022 FINDINGS: Intrauterine gestational sac: Single intrauterine gestational sac is seen  in the right cornual region, with marked thinning of the overlying cornual myometrium. Yolk sac:  Visualized. Embryo:  Visualized. Cardiac Activity: Not Visualized. MSD: 21 mm   7 w   0 d CRL:  4 mm   6 w   0 d Subchorionic hemorrhage:  None visualized. Maternal uterus/adnexae: Retroflexed uterus. Normal appearance of both ovaries. No evidence of hemoperitoneum. No other adnexal mass identified. IMPRESSION: Increased growth of right cornual ectopic pregnancy since prior study, although no embryonic cardiac activity visualized. No evidence of hemoperitoneum. Critical Value/emergent results were called by telephone at the time of interpretation on 09/05/2022 at 4:03 pm to provider Jaymin Waln , who verbally acknowledged these results. Electronically Signed   By: Danae Orleans M.D.   On: 09/05/2022 16:05     MDM Labs ordered and reviewed.    UA, UPT CBC, HCG ABO/Rh- A Pos Wet prep and gc/chlamydia US OB Comp Less 14 weeks with Transvaginal  CNM consulted with Dr. Macon Large regarding presentation and results- MD will come see patient in MAU to discuss plan of care  Assessment and Plan   1. Pregnancy, ectopic, cornual or cervical   2. Abdominal pain affecting pregnancy    -MD to assume care of patient   Rolm Bookbinder, Ina Homes 09/05/2022, 2:08 PM

## 2022-09-05 NOTE — Op Note (Signed)
PROCEDURE DATE:09/05/2022  PREOPERATIVE DIAGNOSIS:  Right cornual ectopic pregnancy  POSTOPERATIVE DIAGNOSIS:  The same SURGEON:   Jaynie Collins, M.D. ANESTHESIOLOGY TEAM: Anesthesiologist: Achille Rich, MD CRNA: Rachel Moulds, CRNA; Zollie Beckers, CRNA OPERATION:  Exploratory laparotomy, wedge resection of right cornual ectopic pregnancy and right salpingectomy ANESTHESIA:  General endotracheal.  INDICATIONS: The patient is a 22 y.o. G4P0030 with right cornual ectopic pregnancy who desired definitive surgical management. The risks of surgery were discussed with the patient including but not limited to: bleeding which may require transfusion or reoperation; infection which may require prolonged hospitalization or re-hospitalization and antibiotic therapy; injury to uterus, bowel, bladder, ureters and major vessels or other surrounding organs which may lead to other procedures; formation of adhesions; need for additional procedures or subsequent procedures secondary to intraoperative injury or abnormal pathology; thromboembolic phenomenon; incisional problems and other postoperative or anesthesia complications.  Discussed implications for future pregnancies; need for repeat cesarean deliveries at 36-[redacted] weeks gestation to avoid going into labor given the increased risk of uterine rupture during future pregnancies  Written informed consent was obtained.    OPERATIVE FINDINGS: 3 cm bulging right cornual ectopic pregnancy noted, no active bleeding.  Small uterus with normal left fallopian tube and normal bilateral ovaries.   No other anomalies noted in pelvis.  ESTIMATED BLOOD LOSS: 25 ml SPECIMENS: Right cornua with ectopic pregnancy and right fallopian tube sent to pathology COMPLICATIONS:  None immediate.  DESCRIPTION OF PROCEDURE:  The patient received intravenous antibiotics and had sequential compression devices applied to her lower extremities while in MAU.   She was taken to the operating  room and placed under general anesthesia without difficulty.The abdomen and perineum were prepped and draped in a sterile manner, and she was placed in a dorsal supine position.   A Foley catheter was inserted into the bladder and attached to constant drainage. After an adequate timeout was performed, a small Pfannensteil skin incision was made with the scalpel.  This incision was taken down to the fascia and the fascia was incised in the midline and the fascial incision was then extended bilaterally without difficulty. The fascia was then dissected off the underlying rectus muscles using blunt and sharp dissection. The rectus muscles were split bluntly in the midline and the peritoneum entered bluntly without complication. This peritoneal incision was then extended superiorly and inferiorly with care given to prevent bowel or bladder injury.   The bowel was then packed away with moist laparotomy sponges.  The base of the right cornual region was injected with Vasopressin solution then clamped using long Kelly clamps on both sides leaving a wedge area that involved the ectopic gestation and right fallopian tube. This wedge region was then resected.  The pedicle was doubly suture ligated with 0 Vicryl on both sides; additional myometrial and serosal layers were added with 0 Vicryl.  Good hemostasis was noted; there was some mild serosal bleeding.  The pelvis was irrigated and good hemostasis was obtained with application of Surgicel over the operative site.  All laparotomy sponges and instruments were removed from the abdomen. The peritoneum and muscles were reapproximated with 0 Vicryl interrupted stitches. The fascia was also closed in a running fashion with 0 Vicryl suture. The subcutaneous layer was irrigated, injected with 30 ml of 0.5% Marcaine. The skin was closed with a 4-0 Vicryl subcuticular stitch. Sponge, lap, needle, and instrument counts were correct times three. The patient was taken to the recovery  area awake, extubated and in stable  condition.   Nemesis Rainwater, MD,Jaynie Collinstrician & Gynecologist, [facility] [employer], [facility]

## 2022-09-05 NOTE — Anesthesia Preprocedure Evaluation (Signed)
Anesthesia Evaluation  Patient identified by MRN, date of birth, ID band Patient awake    Reviewed: Allergy & Precautions, H&P , NPO status , Patient's Chart, lab work & pertinent test results  Airway Mallampati: II   Neck ROM: full    Dental   Pulmonary neg pulmonary ROS   breath sounds clear to auscultation       Cardiovascular hypertension,  Rhythm:regular Rate:Normal     Neuro/Psych  Headaches PSYCHIATRIC DISORDERS Anxiety Depression       GI/Hepatic   Endo/Other  diabetes, Gestational    Renal/GU      Musculoskeletal   Abdominal   Peds  Hematology   Anesthesia Other Findings   Reproductive/Obstetrics (+) Pregnancy Ectopic pregnancy  Prior C/S in 2022                             Anesthesia Physical Anesthesia Plan  ASA: 2 and emergent  Anesthesia Plan: General   Post-op Pain Management:    Induction: Intravenous, Rapid sequence and Cricoid pressure planned  PONV Risk Score and Plan: 3 and Ondansetron, Dexamethasone, Midazolam and Treatment may vary due to age or medical condition  Airway Management Planned: Oral ETT  Additional Equipment:   Intra-op Plan:   Post-operative Plan: Extubation in OR  Informed Consent: I have reviewed the patients History and Physical, chart, labs and discussed the procedure including the risks, benefits and alternatives for the proposed anesthesia with the patient or authorized representative who has indicated his/her understanding and acceptance.     Dental advisory given  Plan Discussed with: CRNA, Anesthesiologist and Surgeon  Anesthesia Plan Comments:        Anesthesia Quick Evaluation

## 2022-09-05 NOTE — Transfer of Care (Signed)
Immediate Anesthesia Transfer of Care Note  Patient: Judi Ihnen  Procedure(s) Performed: EXPLORATORY LAPAROTOMY WITH RESECTION OF CORNUAL ECTOPIC PREGNANCY (Right: Abdomen) UNILATERAL SALPINGECTOMY (Right)  Patient Location: PACU  Anesthesia Type:General  Level of Consciousness: drowsy  Airway & Oxygen Therapy: Patient Spontanous Breathing and Patient connected to nasal cannula oxygen  Post-op Assessment: Report given to RN, Post -op Vital signs reviewed and stable, and Patient moving all extremities X 4  Post vital signs: Reviewed and stable  Last Vitals:  Vitals Value Taken Time  BP 142/88 09/05/22 1949  Temp    Pulse 62 09/05/22 1950  Resp 20 09/05/22 1950  SpO2 100 % 09/05/22 1950  Vitals shown include unvalidated device data.  Last Pain:  Vitals:   09/05/22 1724  TempSrc: Oral  PainSc:          Complications: No notable events documented.

## 2022-09-05 NOTE — MAU Note (Signed)
.  Bethany Avery is a 22 y.o. at [redacted]w[redacted]d here in MAU reporting: lower abdominal cramping and vaginal bleeding that started x4 days ago. She is also having some nausea and vomiting, has thrown up x3 in the last 24 hrs. States they took a pregnancy test yesterday in jail and it was positive. Unsure if this pregnancy is new or left over from the ectopic she had in march.   Pain score: 8 Vitals:   09/05/22 1341  BP: (!) 142/78  Pulse: 74  Resp: 14  Temp: 98.8 F (37.1 C)  SpO2: 100%

## 2022-09-05 NOTE — Anesthesia Procedure Notes (Signed)
Procedure Name: Intubation Date/Time: 09/05/2022 6:09 PM  Performed by: Zollie Beckers, CRNAPre-anesthesia Checklist: Patient identified, Emergency Drugs available, Suction available and Patient being monitored Patient Re-evaluated:Patient Re-evaluated prior to induction Oxygen Delivery Method: Circle system utilized Preoxygenation: Pre-oxygenation with 100% oxygen Induction Type: IV induction Ventilation: Mask ventilation without difficulty Laryngoscope Size: Mac and 3 Grade View: Grade I Tube type: Oral Tube size: 7.0 mm Number of attempts: 1 Airway Equipment and Method: Stylet Placement Confirmation: ETT inserted through vocal cords under direct vision, positive ETCO2 and breath sounds checked- equal and bilateral Secured at: 22 cm Tube secured with: Tape Dental Injury: Teeth and Oropharynx as per pre-operative assessment

## 2022-09-06 ENCOUNTER — Encounter (HOSPITAL_COMMUNITY): Payer: Self-pay | Admitting: Obstetrics & Gynecology

## 2022-09-06 DIAGNOSIS — Z9889 Other specified postprocedural states: Secondary | ICD-10-CM

## 2022-09-06 LAB — GLUCOSE, CAPILLARY: Glucose-Capillary: 129 mg/dL — ABNORMAL HIGH (ref 70–99)

## 2022-09-06 LAB — CBC
HCT: 42.6 % (ref 36.0–46.0)
Hemoglobin: 13.9 g/dL (ref 12.0–15.0)
MCH: 30.5 pg (ref 26.0–34.0)
MCHC: 32.6 g/dL (ref 30.0–36.0)
MCV: 93.4 fL (ref 80.0–100.0)
Platelets: 412 10*3/uL — ABNORMAL HIGH (ref 150–400)
RBC: 4.56 MIL/uL (ref 3.87–5.11)
RDW: 12.5 % (ref 11.5–15.5)
WBC: 17.5 10*3/uL — ABNORMAL HIGH (ref 4.0–10.5)
nRBC: 0 % (ref 0.0–0.2)

## 2022-09-06 MED ORDER — CHLORHEXIDINE GLUCONATE CLOTH 2 % EX PADS
6.0000 | MEDICATED_PAD | Freq: Every day | CUTANEOUS | Status: DC
  Administered 2022-09-06: 6 via TOPICAL

## 2022-09-06 MED ORDER — TRAZODONE HCL 50 MG PO TABS
50.0000 mg | ORAL_TABLET | Freq: Every evening | ORAL | Status: DC | PRN
  Administered 2022-09-06: 50 mg via ORAL
  Filled 2022-09-06: qty 1

## 2022-09-06 MED ORDER — ESCITALOPRAM OXALATE 20 MG PO TABS
20.0000 mg | ORAL_TABLET | Freq: Every day | ORAL | Status: DC
  Administered 2022-09-07: 20 mg via ORAL
  Filled 2022-09-06: qty 1

## 2022-09-06 MED ORDER — HYDROXYZINE HCL 25 MG PO TABS
25.0000 mg | ORAL_TABLET | Freq: Three times a day (TID) | ORAL | Status: DC | PRN
  Administered 2022-09-06: 25 mg via ORAL
  Filled 2022-09-06: qty 1

## 2022-09-06 NOTE — Progress Notes (Signed)
FACULTY PRACTICE GYNECOLOGY COMPREHENSIVE PROGRESS NOTE  Bethany Avery is a 22 y.o. G3P1011 POD1 s/p exploratory laparotomy & R cornual wedge resection of cornual ectopic pregnancy.   Length of Stay:  1 Days. Admitted 09/05/2022  Subjective: Doing well this AM. Having pain, but pain medication seems to help. Foley in place. Not yet OOB. Not passing flatus.    Vitals:  Blood pressure 118/70, pulse 69, temperature 97.8 F (36.6 C), temperature source Oral, resp. rate 16, height  (1.651 m), weight 100.9 kg, last menstrual period 06/12/2022, SpO2 100 %, currently breastfeeding. Physical Examination: CONSTITUTIONAL: Well-developed, well-nourished female in no acute distress.  CARDIOVASCULAR: Normal heart rate noted RESPIRATORY: Effort normal, no problems with respiration noted MUSCULOSKELETAL: No edema or evidence of DVT ABDOMEN: Soft, appropriately mildly tender for POD1, non distended. Honeycomb dressing covering pfannensteil incision, clean, dry & intact.  GU: Foley in place draining clear urine.  Results for orders placed or performed during the hospital encounter of 09/05/22 (from the past 48 hour(s))  Urinalysis, Routine w reflex microscopic -Urine, Clean Catch     Status: Abnormal   Collection Time: 09/05/22  1:49 PM  Result Value Ref Range   Color, Urine YELLOW YELLOW   APPearance HAZY (A) CLEAR   Specific Gravity, Urine 1.029 1.005 - 1.030   pH 6.0 5.0 - 8.0   Glucose, UA NEGATIVE NEGATIVE mg/dL   Hgb urine dipstick MODERATE (A) NEGATIVE   Bilirubin Urine NEGATIVE NEGATIVE   Ketones, ur 20 (A) NEGATIVE mg/dL   Protein, ur 30 (A) NEGATIVE mg/dL   Nitrite NEGATIVE NEGATIVE   Leukocytes,Ua NEGATIVE NEGATIVE   RBC / HPF 6-10 0 - 5 RBC/hpf   WBC, UA 0-5 0 - 5 WBC/hpf   Bacteria, UA NONE SEEN NONE SEEN   Squamous Epithelial / HPF 0-5 0 - 5 /HPF   Mucus PRESENT     Comment: Performed at [facility], [address] [place], [place] [address]  Pregnancy, urine  POC     Status: Abnormal   Collection Time: 09/05/22  1:49 PM  Result Value Ref Range   Preg Test, Ur POSITIVE (A) NEGATIVE    Comment:        THE SENSITIVITY OF THIS METHODOLOGY IS >24 mIU/mL   CBC     Status: None   Collection Time: 09/05/22  2:18 PM  Result Value Ref Range   WBC 9.5 4.0 - 10.5 K/uL   RBC 4.40 3.87 - 5.11 MIL/uL   Hemoglobin 13.3 12.0 - 15.0 g/dL   HCT 60.4 54.0 - 98.1 %   MCV 93.4 80.0 - 100.0 fL   MCH 30.2 26.0 - 34.0 pg   MCHC 32.4 30.0 - 36.0 g/dL   RDW 19.1 47.8 - 29.5 %   Platelets 392 150 - 400 K/uL   nRBC 0.0 0.0 - 0.2 %    Comment: Performed at [facility], [address], [place], [place] [address]  hCG, quantitative, pregnancy     Status: Abnormal   Collection Time: 09/05/22  2:18 PM  Result Value Ref Range   hCG, Beta Chain, Quant, S 616 (H) <5 mIU/mL    Comment:          GEST. AGE      CONC.  (mIU/mL)   <=1 WEEK        5 - 50     2 WEEKS       50 - 500     3 WEEKS  100 - 10,000     4 WEEKS     1,000 - 30,000     5 WEEKS     3,500 - 115,000   6-8 WEEKS     12,000 - 270,000    12 WEEKS     15,000 - 220,000        FEMALE AND NON-PREGNANT FEMALE:     LESS THAN 5 mIU/mL Performed at [facility], [address], [place], [place] [address]   Wet prep, genital     Status: Abnormal   Collection Time: 09/05/22  2:19 PM   Specimen: PATH Cytology Cervicovaginal Ancillary Only  Result Value Ref Range   Yeast Wet Prep HPF POC NONE SEEN NONE SEEN   Trich, Wet Prep NONE SEEN NONE SEEN   Clue Cells Wet Prep HPF POC PRESENT (A) NONE SEEN   WBC, Wet Prep HPF POC >=10 (A) <10   Sperm NONE SEEN     Comment: Performed at [facility], [address], [place], [place] [address]  Type and screen [facility]     Status: None   Collection Time: 09/05/22  4:45 PM  Result Value Ref Range   ABO/RH(D) A POS    Antibody Screen NEG    Sample Expiration      09/08/2022,2359 Performed at [facility], [address] [place], [place] [address]   Glucose, capillary     Status: Abnormal   Collection Time: 09/05/22  7:53 PM  Result Value Ref Range   Glucose-Capillary 115 (H) 70 - 99 mg/dL    Comment: Glucose reference range applies only to samples taken after fasting for at least 8 hours.  CBC     Status: Abnormal   Collection Time: 09/05/22  8:35 PM  Result Value Ref Range   WBC 17.3 (H) 4.0 - 10.5 K/uL   RBC 4.44 3.87 - 5.11 MIL/uL   Hemoglobin 14.0 12.0 - 15.0 g/dL   HCT 70.2 63.7 - 85.8 %   MCV 93.0 80.0 - 100.0 fL   MCH 31.5 26.0 - 34.0 pg   MCHC 33.9 30.0 - 36.0 g/dL   RDW 85.0 27.7 - 41.2 %   Platelets 351 150 - 400 K/uL   nRBC 0.0 0.0 - 0.2 %    Comment: Performed at [facility], [address], [place], [place] [address]  CBC     Status: Abnormal   Collection Time: 09/06/22 10:26 AM  Result Value Ref Range   WBC 17.5 (H) 4.0 - 10.5 K/uL   RBC 4.56 3.87 - 5.11 MIL/uL   Hemoglobin 13.9 12.0 - 15.0 g/dL   HCT 67.2 09.4 - 70.9 %   MCV 93.4 80.0 - 100.0 fL   MCH 30.5 26.0 - 34.0 pg   MCHC 32.6 30.0 - 36.0 g/dL   RDW 62.8 36.6 - 29.4 %   Platelets 412 (H) 150 - 400 K/uL   nRBC 0.0 0.0 - 0.2 %    Comment: Performed at [facility], [address], [place], [place] [address]    US OB LESS THAN 14 WEEKS WITH [place] TRANSVAGINAL  Result Date: 09/05/2022 CLINICAL DATA:  Follow-up cornual ectopic pregnancy. Pelvic pain in pregnancy. EXAM: OBSTETRIC <14 WK [place] AND TRANSVAGINAL OB US TECHNIQUE: Both transabdominal and transvaginal ultrasound examinations were performed for complete evaluation of the gestation as well as the maternal uterus, adnexal regions, and pelvic cul-de-sac. Transvaginal technique was performed to  assess early pregnancy. COMPARISON:  07/26/2022 FINDINGS: Intrauterine gestational sac: Single intrauterine gestational sac is seen in the right cornual region, with marked thinning of the overlying cornual myometrium. Yolk sac:  Visualized. Embryo:  Visualized. Cardiac  Activity: Not Visualized. MSD: 21 mm   7 w   0 d CRL:  4 mm   6 w   0 d Subchorionic hemorrhage:  None visualized. Maternal uterus/adnexae: Retroflexed uterus. Normal appearance of both ovaries. No evidence of hemoperitoneum. No other adnexal mass identified. IMPRESSION: Increased growth of right cornual ectopic pregnancy since prior study, although no embryonic cardiac activity visualized. No evidence of hemoperitoneum. Critical Value/emergent results were called by telephone at the time of interpretation on 09/05/2022 at 4:03 pm to provider CAROLINE NEILL , who verbally acknowledged these results. Electronically Signed   By: Danae Orleans M.D.   On: 09/05/2022 16:05    Current scheduled medications  acetaminophen  1,000 mg Oral Q6H   Chlorhexidine Gluconate Cloth  6 each Topical Daily   docusate sodium  100 mg Oral BID   gabapentin  100 mg Oral BID   ketorolac  30 mg Intravenous Q6H   Followed by   Melene Muller ON 09/07/2022] ibuprofen  600 mg Oral Q6H   pantoprazole  40 mg Oral Daily    I have reviewed the patient's current medications.  ASSESSMENT: Principal Problem:   S/P exploratory laparotomy Active Problems:   Pregnancy, ectopic, cornual or cervical   H/O cesarean section   PLAN: 21yo POD1 s/p XL/R cornual wedge resection for R cornual ectopic pregnancy  Recovering appropriately Pain per ERAS Remove foley Ambulate to bathroom per ERAS Regular diet Honeycomb dressing to remain in place until POD5-7  Continue postoperative care. Anticipate discharge to jail on POD2  Harvie Bridge, MD Obstetrician & Gynecologist, [facility] for [employer], [facility]

## 2022-09-06 NOTE — Progress Notes (Signed)
Verbal order from Dr. Berton Lan to remove foley catheter.

## 2022-09-07 ENCOUNTER — Other Ambulatory Visit: Payer: Medicaid Other

## 2022-09-07 ENCOUNTER — Other Ambulatory Visit (HOSPITAL_COMMUNITY): Payer: Self-pay

## 2022-09-07 ENCOUNTER — Other Ambulatory Visit: Payer: Self-pay | Admitting: Obstetrics and Gynecology

## 2022-09-07 DIAGNOSIS — O008 Other ectopic pregnancy without intrauterine pregnancy: Principal | ICD-10-CM

## 2022-09-07 DIAGNOSIS — Z9889 Other specified postprocedural states: Secondary | ICD-10-CM

## 2022-09-07 DIAGNOSIS — Z3A12 12 weeks gestation of pregnancy: Secondary | ICD-10-CM

## 2022-09-07 MED ORDER — OXYCODONE-ACETAMINOPHEN 5-325 MG PO TABS: 1.0000 | ORAL_TABLET | Freq: Four times a day (QID) | ORAL | 0 refills | Status: DC | PRN

## 2022-09-07 MED ORDER — IBUPROFEN 600 MG PO TABS
600.0000 mg | ORAL_TABLET | Freq: Four times a day (QID) | ORAL | 0 refills | Status: DC
  Filled 2022-09-07: qty 30, 8d supply, fill #0

## 2022-09-07 MED ORDER — OXYCODONE-ACETAMINOPHEN 5-325 MG PO TABS
1.0000 | ORAL_TABLET | Freq: Four times a day (QID) | ORAL | 0 refills | Status: DC | PRN
  Filled 2022-09-07: qty 15, 4d supply, fill #0

## 2022-09-07 NOTE — Progress Notes (Signed)
Patient was given discharge instructions and stated understanding.  Patient stated she is a inmate and can not take the OXY prescribed by MD to jail.  She said she will be home tomorrow and to have the MD send the prescription to [facility] on [address] in [place]. I Sent the MD a note to change the patients medication prescription to [facility].

## 2022-09-07 NOTE — Plan of Care (Signed)
  Problem: Education: Goal: Knowledge of General Education information will improve Description: Including pain rating scale, medication(s)/side effects and non-pharmacologic comfort measures Outcome: Adequate for Discharge   Problem: Health Behavior/Discharge Planning: Goal: Ability to manage health-related needs will improve Outcome: Adequate for Discharge   Problem: Clinical Measurements: Goal: Ability to maintain clinical measurements within normal limits will improve Outcome: Adequate for Discharge Goal: Will remain free from infection Outcome: Adequate for Discharge Goal: Diagnostic test results will improve Outcome: Adequate for Discharge Goal: Respiratory complications will improve Outcome: Adequate for Discharge Goal: Cardiovascular complication will be avoided Outcome: Adequate for Discharge   Problem: Activity: Goal: Risk for activity intolerance will decrease Outcome: Adequate for Discharge   Problem: Nutrition: Goal: Adequate nutrition will be maintained Outcome: Adequate for Discharge   Problem: Coping: Goal: Level of anxiety will decrease Outcome: Adequate for Discharge   Problem: Elimination: Goal: Will not experience complications related to bowel motility Outcome: Adequate for Discharge Goal: Will not experience complications related to urinary retention Outcome: Adequate for Discharge   Problem: Pain Managment: Goal: General experience of comfort will improve Outcome: Adequate for Discharge   Problem: Safety: Goal: Ability to remain free from injury will improve Outcome: Adequate for Discharge   Problem: Skin Integrity: Goal: Risk for impaired skin integrity will decrease Outcome: Adequate for Discharge   Problem: Education: Goal: Knowledge of the prescribed therapeutic regimen will improve Outcome: Adequate for Discharge Goal: Understanding of sexual limitations or changes related to disease process or condition will improve Outcome: Adequate  for Discharge Goal: Individualized Educational Video(s) Outcome: Adequate for Discharge   Problem: Self-Concept: Goal: Communication of feelings regarding changes in body function or appearance will improve Outcome: Adequate for Discharge   Problem: Skin Integrity: Goal: Demonstration of wound healing without infection will improve Outcome: Adequate for Discharge   

## 2022-09-07 NOTE — TOC CM/SW Note (Signed)
Patient discharging back to [facility] today. NCM called  spoke with [facility] [phone]. She is aware that patient will be returning today. Nurse will call report  and send AVS with patient. NCM faxed discharge summary to [phone]

## 2022-09-07 NOTE — Progress Notes (Signed)
SWOT RN reviewed discharge information with patient. Report called to Keane Scrape DON at [facility]. Per Tammy inmates cannot be prescribed oxycodone. Paged on call provider for new orders.    Patient left before orders could be clarified and without having surgical dressing changed. Notified Tammy DON

## 2022-09-07 NOTE — Discharge Summary (Signed)
Physician Discharge Summary  Patient ID: Bethany Avery MRN: [id number] DOB/AGE: [birth year] 22 y.o.  Admit date: 09/05/2022 Discharge date: 09/07/2022  Admission Diagnoses: ruptured ectopic pregnancy  Discharge Diagnoses:  Principal Problem:   S/P exploratory laparotomy Active Problems:   Pregnancy, ectopic, cornual or cervical   H/O cesarean section   Discharged Condition: good  Hospital Course: Patient admitted with a ruptured ectopic pregnancy and underwent an exploratory laparotomy with wedge resection of right cornual ectopic pregnancy. Patient is doing well on POD#2 and reports pain is well controlled with pain medication. She is ambulating to the bathroom and is tolerating a regular diet. Patient is stable for discharge to jail. Post op precautions reviewed with the patient who verbalized understanding and all questions were answered. Patient is interested in IUD for contraception  Consults: None   Discharge Exam: Blood pressure 101/62, pulse 64, temperature 98.6 F (37 C), temperature source Oral, resp. rate 16, height 5\' 5"  (1.651 m), weight 100.9 kg, last menstrual period 06/12/2022, SpO2 100 %, currently breastfeeding. GENERAL: Well-developed, well-nourished female in no acute distress.  LUNGS: Clear to auscultation bilaterally.  HEART: Regular rate and rhythm. ABDOMEN: Soft, appropriately tender, non distended. Honeycomb dressing minimally stained- will be replaced prior to discharge EXTREMITIES: No cyanosis, clubbing, or edema, 2+ distal pulses.   Disposition:  There are no questions and answers to display.         Allergies as of 09/07/2022       Reactions   Nickel Rash        Medication List     TAKE these medications    cetirizine 10 MG tablet Commonly known as: ZYRTEC Take by mouth.   escitalopram 20 MG tablet Commonly known as: LEXAPRO Take by mouth.   fluticasone 50 MCG/ACT nasal spray Commonly known as: FLONASE Place into the  nose daily.   Hailey 1.5/30 1.5-30 MG-MCG tablet Generic drug: Norethindrone Acetate-Ethinyl Estradiol Take 1 tablet by mouth daily.   hydrOXYzine 25 MG tablet Commonly known as: ATARAX Take 25 mg by mouth 2 (two) times daily.   ibuprofen 600 MG tablet Commonly known as: ADVIL Take 1 tablet (600 mg total) by mouth every 6 (six) hours.   oxyCODONE-acetaminophen 5-325 MG tablet Commonly known as: PERCOCET/ROXICET Take 1 tablet by mouth every 6 (six) hours as needed.   traZODone 50 MG tablet Commonly known as: DESYREL Take 50 mg by mouth at bedtime.   valACYclovir 500 MG tablet Commonly known as: VALTREX Take by mouth daily.        Follow-up Information     [facility] for [facility] Follow up.   Specialty: Obstetrics and Gynecology Why: An appointment will be scheduled for you to follow in 2-3 weeks for post op chech and IUD insertion Contact information: [address] [place] [address] [phone]                Signed: Catalina Antigua 09/07/2022, 10:02 AM

## 2022-09-08 LAB — SURGICAL PATHOLOGY

## 2022-09-08 LAB — GC/CHLAMYDIA PROBE AMP (~~LOC~~) NOT AT ARMC
Chlamydia: NEGATIVE
Comment: NEGATIVE
Comment: NORMAL
Neisseria Gonorrhea: NEGATIVE

## 2022-09-08 NOTE — Anesthesia Postprocedure Evaluation (Signed)
Anesthesia Post Note  Patient: Bethany Avery  Procedure(s) Performed: EXPLORATORY LAPAROTOMY WITH RESECTION OF CORNUAL ECTOPIC PREGNANCY (Right: Abdomen) UNILATERAL SALPINGECTOMY (Right)     Patient location during evaluation: PACU Anesthesia Type: General Level of consciousness: awake and alert Pain management: pain level controlled Vital Signs Assessment: post-procedure vital signs reviewed and stable Respiratory status: spontaneous breathing, nonlabored ventilation, respiratory function stable and patient connected to nasal cannula oxygen Cardiovascular status: blood pressure returned to baseline and stable Postop Assessment: no apparent nausea or vomiting Anesthetic complications: no   No notable events documented.  Last Vitals:  Vitals:   09/07/22 0628 09/07/22 0742  BP: 117/66 101/62  Pulse: 66 64  Resp:  16  Temp: 36.7 C 37 C  SpO2: 100% 100%    Last Pain:  Vitals:   09/07/22 0958  TempSrc:   PainSc: 6                  Anshi Jalloh S

## 2022-09-23 ENCOUNTER — Other Ambulatory Visit: Payer: Self-pay

## 2022-09-23 ENCOUNTER — Encounter: Payer: Self-pay | Admitting: Obstetrics & Gynecology

## 2022-09-23 ENCOUNTER — Ambulatory Visit (INDEPENDENT_AMBULATORY_CARE_PROVIDER_SITE_OTHER): Payer: Medicaid Other | Admitting: Obstetrics & Gynecology

## 2022-09-23 VITALS — BP 106/59 | HR 74 | Wt 221.0 lb

## 2022-09-23 DIAGNOSIS — Z30430 Encounter for insertion of intrauterine contraceptive device: Secondary | ICD-10-CM | POA: Diagnosis not present

## 2022-09-23 DIAGNOSIS — Z09 Encounter for follow-up examination after completed treatment for conditions other than malignant neoplasm: Secondary | ICD-10-CM

## 2022-09-23 LAB — POCT PREGNANCY, URINE: Preg Test, Ur: NEGATIVE

## 2022-09-23 MED ORDER — LEVONORGESTREL 20.1 MCG/DAY IU IUD
1.0000 | INTRAUTERINE_SYSTEM | Freq: Once | INTRAUTERINE | Status: AC
Start: 2022-09-23 — End: 2022-09-23
  Administered 2022-09-23: 1 via INTRAUTERINE

## 2022-09-23 NOTE — Patient Instructions (Signed)

## 2022-09-23 NOTE — Progress Notes (Signed)
   GYNECOLOGY OFFICE VISIT NOTE  Subjective:     Bethany Avery is a 22 y.o. female who presents to the clinic  status post exploratory laparotomy, wedge resection of right cornual ectopic pregnancy and right salpingectomy on 09/05/2022. Eating a regular diet without difficulty. Bowel movements are normal. The patient is not having any significant pain.  She desires hormonal IUD for contraception.  The following portions of the patient's history were reviewed and updated as appropriate: allergies, current medications, past family history, past medical history, past social history, past surgical history, and problem list.  Pap done 12/30/2021 at [facility] and was normal.  Review of Systems Pertinent items noted in HPI and remainder of comprehensive ROS otherwise negative.    Objective:    BP (!) 106/59   Pulse 74   Wt 221 lb (100.2 kg)   LMP 06/12/2022   Breastfeeding Unknown   BMI 36.78 kg/m  General:  alert and no distress  Abdomen: soft, bowel sounds active, non-tender  Incision:   healing well, no drainage, no erythema, no hernia, no seroma, no swelling, no dehiscence, incision well approximated   IUD Insertion Procedure Note Patient identified, informed consent performed, consent signed.   Discussed risks of irregular bleeding, cramping, infection, malpositioning or misplacement of the IUD outside the uterus which may require further procedure such as laparoscopy. Also discussed >99% contraception efficacy, increased risk of ectopic pregnancy with failure of method.   Emphasized that this did not protect against STIs, condoms recommended during all sexual encounters. Time out was performed.  Urine pregnancy test negative. Speculum placed in the vagina.  Cervix visualized.  Cleaned with Betadine x 2.  Grasped anteriorly with a single tooth tenaculum.  Uterus sounded to 8 cm.  Liletta IUD placed per manufacturer's recommendations.  Strings trimmed to 3 cm. Tenaculum was  removed, good hemostasis noted.  Patient tolerated procedure well.    09/05/2022 Surgical Pathology FALLOPIAN TUBE, RIGHT WITH CORNUAL ECTOPIC PREGNANCY:  - Benign fallopian tube with chorionic villi, consistent with ectopic pregnancy     Assessment:    Doing well postoperatively.  Liletta IUD placed for contraception. Operative findings again reviewed. Pathology report discussed.    Plan:    1. Continue any current medications. 2. Wound care discussed. 3. Patient was given post-IUD insertion instructions.  She will follow up in 4 weeks for IUD check  .    Jaynie Collins, MD, FACOG Obstetrician & Gynecologist, [facility] for [employer], [facility]

## 2022-10-08 ENCOUNTER — Ambulatory Visit (INDEPENDENT_AMBULATORY_CARE_PROVIDER_SITE_OTHER): Payer: Medicaid Other | Admitting: Plastic Surgery

## 2022-10-08 ENCOUNTER — Encounter: Payer: Self-pay | Admitting: Plastic Surgery

## 2022-10-08 VITALS — BP 114/75 | HR 93 | Ht 65.0 in | Wt 221.8 lb

## 2022-10-08 DIAGNOSIS — Z6836 Body mass index (BMI) 36.0-36.9, adult: Secondary | ICD-10-CM

## 2022-10-08 DIAGNOSIS — N62 Hypertrophy of breast: Secondary | ICD-10-CM | POA: Diagnosis not present

## 2022-10-08 DIAGNOSIS — M546 Pain in thoracic spine: Secondary | ICD-10-CM | POA: Diagnosis not present

## 2022-10-08 DIAGNOSIS — M542 Cervicalgia: Secondary | ICD-10-CM

## 2022-10-08 DIAGNOSIS — F17210 Nicotine dependence, cigarettes, uncomplicated: Secondary | ICD-10-CM | POA: Diagnosis not present

## 2022-10-08 NOTE — Progress Notes (Signed)
Referring Provider Stevphen Rochester, MD [address] [place],  [place] [address]   CC:  Chief Complaint  Patient presents with   Consult      Bethany Avery is an 22 y.o. female.  HPI: Bethany Avery is a 22 year old female who presents today for evaluation for bilateral breast reduction.  Patient states that she has had upper back and neck pain which she attributes to the large size of her breasts since the birth of her child in 2022.  She is interested in surgical reduction in the size of her breast.  Allergies  Allergen Reactions   Nickel Rash    Outpatient Encounter Medications as of 10/08/2022  Medication Sig   cetirizine (ZYRTEC) 10 MG tablet Take by mouth.   escitalopram (LEXAPRO) 20 MG tablet Take by mouth.   hydrOXYzine (ATARAX) 25 MG tablet Take 25 mg by mouth 2 (two) times daily.   ibuprofen (ADVIL) 600 MG tablet Take 1 tablet (600 mg total) by mouth every 6 (six) hours.   traZODone (DESYREL) 50 MG tablet Take 50 mg by mouth at bedtime.   valACYclovir (VALTREX) 500 MG tablet Take by mouth daily.   fluticasone (FLONASE) 50 MCG/ACT nasal spray Place into the nose daily. (Patient not taking: Reported on 09/23/2022)   [DISCONTINUED] metoCLOPramide (REGLAN) 10 MG tablet Take 1 tablet (10 mg total) by mouth every 8 (eight) hours as needed (take with tylenol for headaches).   [DISCONTINUED] oxyCODONE-acetaminophen (PERCOCET/ROXICET) 5-325 MG tablet Take 1 tablet by mouth every 6 (six) hours as needed.   No facility-administered encounter medications on file as of 10/08/2022.     Past Medical History:  Diagnosis Date   ADHD (attention deficit hyperactivity disorder)    Anxiety    Chlamydia 09/05/2022   In 2017   Depression    Gestational diabetes mellitus, class A2 09/28/2020   [facility] Guidelines for Antenatal Testing and Sonography                       Updated  08/12/2020 with Dr. Noralee Space  INDICATION Growth U/S BPP weekly DELIVERY RECOMMENDATION  (GA)  Diabetes   A1 - good control     A2 - good control     A2  - poor control or poor compliance    (Macrosomia or polyhydramnios)     A2/B and B-C Pregestational Type II DM    Poor control B-C or D-R-F-T  or  Type I DM     Gestational hypertension 07/03/2020   Gonorrhea 02/02/2018   Headache    Ovarian cyst     Past Surgical History:  Procedure Laterality Date   CESAREAN SECTION N/A 12/02/2020   Procedure: CESAREAN SECTION;  Surgeon: Venora Maples, MD;  Location: [facility] LD ORS;  Service: Obstetrics;  Laterality: N/A;   LAPAROTOMY Right 09/05/2022   Procedure: EXPLORATORY LAPAROTOMY WITH RESECTION OF CORNUAL ECTOPIC PREGNANCY;  Surgeon: Tereso Newcomer, MD;  Location: [facility] OR;  Service: Gynecology;  Laterality: Right;   UNILATERAL SALPINGECTOMY Right 09/05/2022   Procedure: UNILATERAL SALPINGECTOMY;  Surgeon: Tereso Newcomer, MD;  Location: [facility] OR;  Service: Gynecology;  Laterality: Right;    Family History  Problem Relation Age of Onset   Hypertension Mother    Healthy Father     Social History   Social History Narrative   Not on file     Review of Systems General: Denies fevers, chills, weight loss CV: Denies chest pain,  shortness of breath, palpitations Breast: Patient has moderately large breasts which she feels is contributing to her upper back and neck pain and difficulty wearing bras that do not dig into her shoulders.  Physical Exam    10/08/2022   10:25 AM 09/23/2022    2:16 PM 09/07/2022    7:42 AM  Vitals with BMI  Height 5\' 5"     Weight 221 lbs 13 oz 221 lbs   BMI 36.91 36.78   Systolic 114 106 284  Diastolic 75 59 62  Pulse 93 74 64    General:  No acute distress,  Alert and oriented, Non-Toxic, Normal speech and affect Breast: Moderately large breasts with grade 3 ptosis.  She also has noticeable asymmetry with the right side larger than left.  Her sternal notch to nipple distance on the right is 37 cm and 37 cm on the left her nipple to fold distance is 15  cm on the right and 16 cm on the left.  She has no palpable abnormalities and the nipples are normal in appearance Mammogram: Patient is 22 years old and has not begun mammographic screening Assessment/Plan Macromastia: The patient is an acceptable candidate for bilateral breast reduction.  I believe that I can remove 700 g per breast.  We discussed at length the location of the incisions and the unpredictable nature of scarring.  We discussed the risks of bleeding, infection, and seroma formation.  She understands I will use drains postoperatively.  We discussed the risks of nipple loss due to nipple ischemia.  We discussed the postoperative limitations of no heavy lifting greater than 20 pounds, no vigorous activity, and no submerging the incisions in water for 6 weeks.  She understands will be important to begin ambulation immediately after surgery to prevent DVT formation.  Understands that it may be difficult to breast-feed after a breast reduction with up to 9% of women being unsuccessful in breast-feeding.  All questions were answered to her satisfaction.  Photographs were obtained today with her consent.  Bethany Avery admits to being a smoker.  I have told her that she will need to be smoke-free for 2 months so we will not be able to schedule her surgery prior to the mid portion of July.  We also discussed that smoking increases her risk of nipple loss significantly.  She will be tested for nicotine at her preoperative appointment   Bethany Avery 10/08/2022, 12:55 PM

## 2022-11-23 ENCOUNTER — Encounter (HOSPITAL_COMMUNITY): Payer: Self-pay

## 2022-11-23 ENCOUNTER — Ambulatory Visit (HOSPITAL_COMMUNITY)
Admission: EM | Admit: 2022-11-23 | Discharge: 2022-11-23 | Disposition: A | Discharge status: Home / Self Care | Payer: MEDICAID | Attending: Family Medicine | Admitting: Family Medicine

## 2022-11-23 ENCOUNTER — Telehealth: Payer: Self-pay | Admitting: Plastic Surgery

## 2022-11-23 DIAGNOSIS — M25561 Pain in right knee: Secondary | ICD-10-CM | POA: Diagnosis not present

## 2022-11-23 DIAGNOSIS — N898 Other specified noninflammatory disorders of vagina: Secondary | ICD-10-CM | POA: Diagnosis not present

## 2022-11-23 LAB — POCT URINE PREGNANCY: Preg Test, Ur: NEGATIVE

## 2022-11-23 LAB — POCT URINALYSIS DIP (MANUAL ENTRY)
Bilirubin, UA: NEGATIVE
Blood, UA: NEGATIVE
Glucose, UA: NEGATIVE mg/dL
Ketones, POC UA: NEGATIVE mg/dL
Leukocytes, UA: NEGATIVE
Nitrite, UA: NEGATIVE
Protein Ur, POC: NEGATIVE mg/dL
Spec Grav, UA: 1.025 (ref 1.010–1.025)
Urobilinogen, UA: 0.2 E.U./dL
pH, UA: 7 (ref 5.0–8.0)

## 2022-11-23 MED ORDER — DICLOFENAC SODIUM 75 MG PO TBEC: 75.0000 mg | DELAYED_RELEASE_TABLET | Freq: Two times a day (BID) | ORAL | 0 refills | Status: DC

## 2022-11-23 MED ORDER — FLUCONAZOLE 150 MG PO TABS: ORAL_TABLET | ORAL | 0 refills | Status: DC

## 2022-11-23 MED ORDER — METRONIDAZOLE 500 MG PO TABS: 500.0000 mg | ORAL_TABLET | Freq: Two times a day (BID) | ORAL | 0 refills | Status: DC

## 2022-11-23 NOTE — Discharge Instructions (Addendum)

## 2022-11-23 NOTE — ED Triage Notes (Signed)
Here for right pain and swelling x 3 days.  Pt c/o abdomen pain and vaginal discharge.

## 2022-11-23 NOTE — Telephone Encounter (Signed)
Bethany Avery, Bethany Avery [id number] saw Dr Ladona Ridgel 10-08-22 and asking an update on the status of sx

## 2022-11-24 LAB — CERVICOVAGINAL ANCILLARY ONLY
Bacterial Vaginitis (gardnerella): POSITIVE — AB
Candida Glabrata: NEGATIVE
Candida Vaginitis: NEGATIVE
Chlamydia: POSITIVE — AB
Comment: NEGATIVE
Comment: NEGATIVE
Comment: NEGATIVE
Comment: NEGATIVE
Comment: NEGATIVE
Comment: NORMAL
Neisseria Gonorrhea: NEGATIVE
Trichomonas: POSITIVE — AB

## 2022-11-25 ENCOUNTER — Telehealth (HOSPITAL_COMMUNITY): Payer: Self-pay | Admitting: Emergency Medicine

## 2022-11-25 MED ORDER — DOXYCYCLINE HYCLATE 100 MG PO CAPS: 100.0000 mg | ORAL_CAPSULE | Freq: Two times a day (BID) | ORAL | 0 refills | Status: AC

## 2022-11-25 NOTE — ED Provider Notes (Signed)
[facility] CARE CENTER   [id number] 11/23/22 Arrival Time: 1129  ASSESSMENT & PLAN:  1. Acute pain of right knee   2. Vaginal discharge    Knee pain likely strain/overuse. Activities as tolerated. NSAID.  Would like to be treated for BV/yeast. No empiric tx for gonorrhea/chlamydia; she prefers to await cytology results.  Discharge Medication List as of 11/23/2022  2:05 PM     START taking these medications   Details  diclofenac (VOLTAREN) 75 MG EC tablet Take 1 tablet (75 mg total) by mouth 2 (two) times daily., Starting Mon 11/23/2022, Normal    fluconazole (DIFLUCAN) 150 MG tablet Take one tablet by mouth as a single dose. May repeat in 3 days if symptoms persist., Normal    metroNIDAZOLE (FLAGYL) 500 MG tablet Take 1 tablet (500 mg total) by mouth 2 (two) times daily., Starting Mon 11/23/2022, Normal        Orders Placed This Encounter  Procedures   POCT urine pregnancy   POC urinalysis dipstick   Work/school excuse note: not needed. Recommend:  Follow-up Information     [facility].   Why: If worsening or failing to improve as anticipated. Contact information: [address] [place] [address] [phone]                Reviewed expectations re: course of current medical issues. Questions answered. Outlined signs and symptoms indicating need for more acute intervention. Patient verbalized understanding. After Visit Summary given.  SUBJECTIVE: History from: patient. Bethany Avery is a 22 y.o. female who reports intermittent moderate pain of her right knee; described as aching; without radiation. Onset: gradual. First noted:  approx 3 d ago . Injury/trama: denies. Symptoms have progressed to a point and plateaued since beginning. Aggravating factors: certain movements and weight bearing. Alleviating factors: have not been identified. Associated symptoms: none reported. Extremity sensation  changes or weakness: none. Self treatment: has not tried OTC therapies.  History of similar: no.  Past Surgical History:  Procedure Laterality Date   CESAREAN SECTION N/A 12/02/2020   Procedure: CESAREAN SECTION;  Surgeon: Venora Maples, MD;  Location: [facility] LD ORS;  Service: Obstetrics;  Laterality: N/A;   LAPAROTOMY Right 09/05/2022   Procedure: EXPLORATORY LAPAROTOMY WITH RESECTION OF CORNUAL ECTOPIC PREGNANCY;  Surgeon: Tereso Newcomer, MD;  Location: [facility] OR;  Service: Gynecology;  Laterality: Right;   UNILATERAL SALPINGECTOMY Right 09/05/2022   Procedure: UNILATERAL SALPINGECTOMY;  Surgeon: Tereso Newcomer, MD;  Location: [facility] OR;  Service: Gynecology;  Laterality: Right;    Also requests STD testing; slight vaginal discharge for a few days. Is sexually active.  OBJECTIVE:  Vitals:   11/23/22 1331  BP: 122/86  Pulse: 85  Resp: 18  Temp: 98.4 F (36.9 C)  TempSrc: Oral  SpO2: 99%    General appearance: alert; no distress HEENT: [place]; AT Neck: supple with FROM Resp: unlabored respirations Extremities: RLE: warm with well perfused appearance; poorly localized moderate tenderness over right superior knee at quadriceps; without gross deformities; swelling: none; bruising: none; knee ROM: normal; overall without bony tenderness to palpation CV: brisk extremity capillary refill of RLE; 2+ DP pulse of RLE. Skin: warm and dry; no visible rashes Neurologic: gait normal; normal sensation and strength of RLE Psychological: alert and cooperative; normal mood and affect    Allergies  Allergen Reactions   Nickel Rash    Past Medical History:  Diagnosis Date   ADHD (attention deficit hyperactivity disorder)  Anxiety    Chlamydia 09/05/2022   In 2017   Depression    Gestational diabetes mellitus, class A2 09/28/2020   [facility] Guidelines for Antenatal Testing and Sonography                       Updated  08/12/2020 with Dr. Noralee Space  INDICATION Growth U/S BPP weekly  DELIVERY RECOMMENDATION (GA)  Diabetes   A1 - good control     A2 - good control     A2  - poor control or poor compliance    (Macrosomia or polyhydramnios)     A2/B and B-C Pregestational Type II DM    Poor control B-C or D-R-F-T  or  Type I DM     Gestational hypertension 07/03/2020   Gonorrhea 02/02/2018   Headache    Ovarian cyst    Social History   Socioeconomic History   Marital status: Significant Other    Spouse name: Not on file   Number of children: Not on file   Years of education: Not on file   Highest education level: 10th grade  Occupational History   Not on file  Tobacco Use   Smoking status: Never   Smokeless tobacco: Never  Vaping Use   Vaping Use: Never used  Substance and Sexual Activity   Alcohol use: Yes    Comment: few times a wk, drank yesterday 2/21   Drug use: Yes    Frequency: 14.0 times per week    Types: Marijuana    Comment: daily   Sexual activity: Not Currently    Birth control/protection: None    Comment: last SI friday 4early morning  Other Topics Concern   Not on file  Social History Narrative   Not on file   Social Determinants of Health   Financial Resource Strain: Not on file  Food Insecurity: No Food Insecurity (11/20/2020)   Hunger Vital Sign    Worried About Running Out of Food in the Last Year: Never true    Ran Out of Food in the Last Year: Never true  Transportation Needs: No Transportation Needs (11/20/2020)   PRAPARE - Administrator, Civil Service (Medical): No    Lack of Transportation (Non-Medical): No  Physical Activity: Not on file  Stress: Not on file  Social Connections: Not on file   Family History  Problem Relation Age of Onset   Hypertension Mother    Healthy Father    Past Surgical History:  Procedure Laterality Date   CESAREAN SECTION N/A 12/02/2020   Procedure: CESAREAN SECTION;  Surgeon: Venora Maples, MD;  [facility];  Service: Obstetrics;  Laterality: N/A;   LAPAROTOMY Right  09/05/2022   Procedure: EXPLORATORY LAPAROTOMY WITH RESECTION OF CORNUAL ECTOPIC PREGNANCY;  Surgeon: Tereso Newcomer, MD;  Location: [facility] OR;  Service: Gynecology;  Laterality: Right;   UNILATERAL SALPINGECTOMY Right 09/05/2022   Procedure: UNILATERAL SALPINGECTOMY;  Surgeon: Tereso Newcomer, MD;  Location: [facility] OR;  Service: Gynecology;  Laterality: Right;       Mardella Layman, MD 11/25/22 1428

## 2022-11-25 NOTE — Telephone Encounter (Signed)
Patient confirmed she is not breastfeeding, no other questions, prescription sent

## 2022-11-25 NOTE — Telephone Encounter (Signed)
Per protocol, patient will need treatment with Doxycycline and Metronidazole Results seen on mychart Attempted to reach patient x 1, VM is not setup UNABLE TO SEND PRESCRIPTION UNTIL I AM ABLE TO REACH PATIENT AND CONFIRM IF SHE IS BREASTFEEDING (CHART STATES UNKNOWN) HHS notified

## 2022-12-15 NOTE — Telephone Encounter (Signed)
Message to patient inquiring about Physical therapy for breast reduction surgery.

## 2023-01-08 ENCOUNTER — Ambulatory Visit: Payer: Medicaid Other | Admitting: Obstetrics and Gynecology

## 2023-01-20 ENCOUNTER — Ambulatory Visit (HOSPITAL_COMMUNITY)
Admission: EM | Admit: 2023-01-20 | Discharge: 2023-01-20 | Disposition: A | Discharge status: Home / Self Care | Payer: Medicaid Other | Attending: Family Medicine | Admitting: Family Medicine

## 2023-01-20 ENCOUNTER — Encounter (HOSPITAL_COMMUNITY): Payer: Self-pay

## 2023-01-20 DIAGNOSIS — Z113 Encounter for screening for infections with a predominantly sexual mode of transmission: Secondary | ICD-10-CM | POA: Diagnosis not present

## 2023-01-20 DIAGNOSIS — Z3202 Encounter for pregnancy test, result negative: Secondary | ICD-10-CM | POA: Diagnosis not present

## 2023-01-20 LAB — HIV ANTIBODY (ROUTINE TESTING W REFLEX): HIV Screen 4th Generation wRfx: NONREACTIVE

## 2023-01-20 LAB — POCT URINE PREGNANCY: Preg Test, Ur: NEGATIVE

## 2023-01-20 NOTE — ED Provider Notes (Signed)
[facility] CARE CENTER   [id number] 01/20/23 Arrival Time: 1255  ASSESSMENT & PLAN:  1. Screening for STDs (sexually transmitted diseases)   2. Pregnancy examination or test, negative result    UPT negative.    Discharge Instructions      We have sent testing for sexually transmitted infections. We will notify you of any positive results once they are received. If required, we will prescribe any medications you might need.  Please refrain from all sexual activity for at least the next seven days.     Without s/s of PID.  Labs Reviewed  HIV ANTIBODY (ROUTINE TESTING W REFLEX)  RPR  CERVICOVAGINAL ANCILLARY ONLY     Discharge Instructions      We have sent testing for sexually transmitted infections. We will notify you of any positive results once they are received. If required, we will prescribe any medications you might need.  Please refrain from all sexual activity for at least the next seven days.      Reviewed expectations re: course of current medical issues. Questions answered. Outlined signs and symptoms indicating need for more acute intervention. Patient verbalized understanding. After Visit Summary given.   SUBJECTIVE:  Bethany Avery is a 22 y.o. female who requests to be tested for STDs and possible pregnancy. Patient denies any symptoms of STDs. States she removed her own IUD and now her periods have been irregular. States she did have one at the beginning of the month.   Patient's last menstrual period was 12/28/2022 (approximate).   OBJECTIVE:  Vitals:   01/20/23 1440  BP: 108/73  Pulse: 84  Resp: 18  Temp: 98.1 F (36.7 C)  TempSrc: Oral  SpO2: 98%     General appearance: alert, cooperative, appears stated age and no distress Lungs: unlabored respirations; speaks full sentences without difficulty Back: no CVA tenderness; FROM at waist Abdomen: soft, non-tender GU: deferred Skin: warm and dry Psychological: alert and  cooperative; normal mood and affect.  Results for orders placed or performed during the hospital encounter of 01/20/23  POCT urine pregnancy  Result Value Ref Range   Preg Test, Ur Negative Negative    Labs Reviewed  HIV ANTIBODY (ROUTINE TESTING W REFLEX)  RPR  POCT URINE PREGNANCY  CERVICOVAGINAL ANCILLARY ONLY    Allergies  Allergen Reactions   Nickel Rash    Past Medical History:  Diagnosis Date   ADHD (attention deficit hyperactivity disorder)    Anxiety    Chlamydia 09/05/2022   In 2017   Depression    Gestational diabetes mellitus, class A2 09/28/2020   [facility] Guidelines for Antenatal Testing and Sonography                       Updated  08/12/2020 with Dr. Noralee Space  INDICATION Growth U/S BPP weekly DELIVERY RECOMMENDATION (GA)  Diabetes   A1 - good control     A2 - good control     A2  - poor control or poor compliance    (Macrosomia or polyhydramnios)     A2/B and B-C Pregestational Type II DM    Poor control B-C or D-R-F-T  or  Type I DM     Gestational hypertension 07/03/2020   Gonorrhea 02/02/2018   Headache    Ovarian cyst    Family History  Problem Relation Age of Onset   Hypertension Mother    Healthy Father    Social History   Socioeconomic History   Marital status:  Significant Other    Spouse name: Not on file   Number of children: Not on file   Years of education: Not on file   Highest education level: 10th grade  Occupational History   Not on file  Tobacco Use   Smoking status: Never   Smokeless tobacco: Never  Vaping Use   Vaping status: Never Used  Substance and Sexual Activity   Alcohol use: Yes    Comment: few times a wk, drank yesterday 2/21   Drug use: Yes    Frequency: 14.0 times per week    Types: Marijuana    Comment: daily   Sexual activity: Not Currently    Birth control/protection: None    Comment: last SI friday 4early morning  Other Topics Concern   Not on file  Social History Narrative   Not on file   Social  Determinants of Health   Financial Resource Strain: Low Risk  (12/01/2022)   Received from [facility]   Overall Financial Resource Strain (CARDIA)    Difficulty of Paying Living Expenses: Not hard at all  Food Insecurity: No Food Insecurity (12/01/2022)   Received from [facility]   Hunger Vital Sign    Worried About Running Out of Food in the Last Year: Never true    Ran Out of Food in the Last Year: Never true  Transportation Needs: No Transportation Needs (12/01/2022)   Received from [facility] - Transportation    Lack of Transportation (Medical): No    Lack of Transportation (Non-Medical): No  Physical Activity: Sufficiently Active (04/16/2021)   Received from [facility], [facility]   Exercise Vital Sign    Days of Exercise per Week: 5 days    Minutes of Exercise per Session: 30 min  Stress: No Stress Concern Present (04/16/2021)   Received from [facility], [facility] - Occupational Stress Questionnaire    Feeling of Stress : Not at all  Social Connections: Unknown (09/23/2021)   Received from [facility], [facility]   Social Network    Social Network: Not on file  Intimate Partner Violence: Unknown (08/27/2021)   Received from [facility], [facility]   HITS    Physically Hurt: Not on file    Insult or Talk Down To: Not on file    Threaten Physical Harm: Not on file    Scream or Curse: Not on file           Mardella Layman, MD 01/20/23 1710

## 2023-01-20 NOTE — ED Triage Notes (Signed)
Patient states she wants to be tested for STDs and possible pregnancy. Patient denies any symptoms of STDs. States she removed her own IUD and now her periods have been irregular. States she did have one at the beginning of the month.

## 2023-01-20 NOTE — Discharge Instructions (Signed)
We have sent testing for sexually transmitted infections. We will notify you of any positive results once they are received. If required, we will prescribe any medications you might need.  Please refrain from all sexual activity for at least the next seven days.

## 2023-01-21 LAB — RPR: RPR Ser Ql: NONREACTIVE

## 2023-01-22 ENCOUNTER — Telehealth (HOSPITAL_COMMUNITY): Payer: Self-pay | Admitting: Emergency Medicine

## 2023-01-22 MED ORDER — METRONIDAZOLE 500 MG PO TABS
500.0000 mg | ORAL_TABLET | Freq: Two times a day (BID) | ORAL | 0 refills | Status: DC
Start: 2023-01-22 — End: 2023-11-12

## 2023-01-22 MED ORDER — FLUCONAZOLE 150 MG PO TABS: 150.0000 mg | ORAL_TABLET | Freq: Once | ORAL | 0 refills | Status: AC

## 2023-01-22 NOTE — Telephone Encounter (Signed)
Metronidazole for positive BV Diflucan for positive yeast

## 2023-01-28 LAB — CERVICOVAGINAL ANCILLARY ONLY
Bacterial Vaginitis (gardnerella): POSITIVE — AB
Candida Glabrata: NEGATIVE
Candida Vaginitis: POSITIVE — AB
Chlamydia: NEGATIVE
Comment: NEGATIVE
Comment: NEGATIVE
Comment: NEGATIVE
Comment: NEGATIVE
Comment: NEGATIVE
Comment: NORMAL
Neisseria Gonorrhea: NEGATIVE
Trichomonas: NEGATIVE

## 2023-01-29 ENCOUNTER — Ambulatory Visit (INDEPENDENT_AMBULATORY_CARE_PROVIDER_SITE_OTHER): Payer: Medicaid Other | Admitting: Obstetrics and Gynecology

## 2023-01-29 ENCOUNTER — Encounter: Payer: Self-pay | Admitting: Obstetrics and Gynecology

## 2023-01-29 ENCOUNTER — Other Ambulatory Visit: Payer: Self-pay

## 2023-01-29 VITALS — BP 114/66 | HR 62 | Wt 224.2 lb

## 2023-01-29 DIAGNOSIS — Z30432 Encounter for removal of intrauterine contraceptive device: Secondary | ICD-10-CM | POA: Diagnosis not present

## 2023-01-29 DIAGNOSIS — Z975 Presence of (intrauterine) contraceptive device: Secondary | ICD-10-CM

## 2023-01-29 DIAGNOSIS — N921 Excessive and frequent menstruation with irregular cycle: Secondary | ICD-10-CM

## 2023-01-29 DIAGNOSIS — Z3009 Encounter for other general counseling and advice on contraception: Secondary | ICD-10-CM

## 2023-01-29 MED ORDER — NORETHIN ACE-ETH ESTRAD-FE 1-20 MG-MCG(24) PO TABS
1.0000 | ORAL_TABLET | Freq: Every day | ORAL | 3 refills | Status: DC
Start: 2023-01-29 — End: 2023-11-12

## 2023-01-29 NOTE — Progress Notes (Addendum)
Obstetrics and Gynecology Visit Return Patient Evaluation  Appointment Date: 01/29/2023  Primary Care Provider: [facility]: [facility]  Chief Complaint: contraception management. Pt removed her Liletta last month  History of Present Illness:  Bethany Avery is a 22 y.o. with above CC. Patient here to make sure everything is okay after removing her IUD last month due to AUB. Period started today  Pap neg 2023. August 2024 STD blood work and swab negative  Review of Systems: as noted in the History of Present Illness.  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 Current Outpatient Medications  Medication Sig Dispense Refill   cetirizine (ZYRTEC) 10 MG tablet Take by mouth.     diclofenac (VOLTAREN) 75 MG EC tablet Take 1 tablet (75 mg total) by mouth 2 (two) times daily. 14 tablet 0   escitalopram (LEXAPRO) 20 MG tablet Take by mouth.     hydrOXYzine (ATARAX) 25 MG tablet Take 25 mg by mouth 2 (two) times daily.     metroNIDAZOLE (FLAGYL) 500 MG tablet Take 1 tablet (500 mg total) by mouth 2 (two) times daily. 14 tablet 0   traZODone (DESYREL) 50 MG tablet Take 50 mg by mouth at bedtime.     valACYclovir (VALTREX) 500 MG tablet Take by mouth daily.     fluticasone (FLONASE) 50 MCG/ACT nasal spray Place into the nose daily. (Patient not taking: Reported on 09/23/2022)     No current facility-administered medications for this  visit.    Allergies: is allergic to nickel.  Physical Exam:  BP 114/66   Pulse 62   Wt 224 lb 3.2 oz (101.7 kg)   LMP 12/28/2022 (Approximate)   BMI 37.31 kg/m  Body mass index is 37.31 kg/m. General appearance: Well nourished, well developed female in no acute distress.  Abdomen: diffusely non tender to palpation, non distended, and no masses, hernias Neuro/Psych:  Normal mood and affect.    Pelvic exam:  Cervical exam performed in the presence of a chaperone EGBUS: normal Vaginal vault: scant old blood in vault Cervix:  wnl Bimanual: negative   Assessment: patient doing well  Plan:  1. Breakthrough bleeding associated with intrauterine device (IUD)  2. Encounter for other general counseling or advice on contraception Patient using IUD for period control more so than for birth control but does desire both. She was on OCPs in the past and after d/w her re: several options, she would like to do OCPs again  Loestrin sent in   RTC: 46m for birth control follow up   Return in about 3 months (around 04/30/2023) for in person, md or app.  No future appointments.  Cornelia Copa MD Attending Center for [employer] [facility])

## 2023-08-25 ENCOUNTER — Ambulatory Visit (HOSPITAL_COMMUNITY): Admission: EM | Admit: 2023-08-25 | Discharge: 2023-08-25 | Disposition: A | Discharge status: Home / Self Care

## 2023-08-25 ENCOUNTER — Encounter (HOSPITAL_COMMUNITY): Payer: Self-pay | Admitting: *Deleted

## 2023-08-25 ENCOUNTER — Other Ambulatory Visit: Payer: Self-pay

## 2023-08-25 DIAGNOSIS — Z3202 Encounter for pregnancy test, result negative: Secondary | ICD-10-CM | POA: Diagnosis present

## 2023-08-25 DIAGNOSIS — Z113 Encounter for screening for infections with a predominantly sexual mode of transmission: Secondary | ICD-10-CM | POA: Diagnosis not present

## 2023-08-25 LAB — POCT URINE PREGNANCY: Preg Test, Ur: NEGATIVE

## 2023-08-25 NOTE — Discharge Instructions (Addendum)
 1. Screening for STD (sexually transmitted disease) (Primary) - Cervicovaginal swab collected in UC sent to lab for further testing results should be available in 1 to 2 days.  2. Pregnancy examination or test, negative result - POCT urine pregnancy completed in UC is negative for hCG.

## 2023-08-25 NOTE — ED Provider Notes (Signed)
 [facility]  Note:  This document was prepared using Dragon voice recognition software and may include unintentional dictation errors.  MRN: [id number] DOB: 2000/08/09  Subjective:   Bethany Avery is a 23 y.o. female presenting for STD testing and urine pregnancy test.  Patient denies any STD symptoms or known exposure.  Patient reports last menstrual period was in February she would like pregnancy testing today in UC.  Denies any abdominal pain, dysuria, increased urinary frequency, vaginal discharge or vaginal lesion.  No current facility-administered medications for this encounter.  Current Outpatient Medications:    cetirizine (ZYRTEC) 10 MG tablet, Take by mouth., Disp: , Rfl:    diclofenac (VOLTAREN) 75 MG EC tablet, Take 1 tablet (75 mg total) by mouth 2 (two) times daily., Disp: 14 tablet, Rfl: 0   fluticasone (FLONASE) 50 MCG/ACT nasal spray, Place into the nose daily. (Patient not taking: Reported on 09/23/2022), Disp: , Rfl:    hydrOXYzine (ATARAX) 25 MG tablet, Take 25 mg by mouth 2 (two) times daily., Disp: , Rfl:    metroNIDAZOLE (FLAGYL) 500 MG tablet, Take 1 tablet (500 mg total) by mouth 2 (two) times daily., Disp: 14 tablet, Rfl: 0   Norethindrone Acetate-Ethinyl Estrad-FE (LOESTRIN 24 FE) 1-20 MG-MCG(24) tablet, Take 1 tablet by mouth daily., Disp: 90 tablet, Rfl: 3   traZODone (DESYREL) 50 MG tablet, Take 50 mg by mouth at bedtime., Disp: , Rfl:    valACYclovir (VALTREX) 500 MG tablet, Take by mouth daily., Disp: , Rfl:    Allergies  Allergen Reactions   Nickel Rash    Past Medical History:  Diagnosis Date   ADHD (attention deficit hyperactivity disorder)    Adolescent behavior problem    Anxiety    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 Noralee Space  INDICATION Growth U/S BPP weekly DELIVERY RECOMMENDATION (GA)  Diabetes   A1 - good control     A2 - good control     A2  - poor control or poor compliance    (Macrosomia or polyhydramnios)     A2/B and B-C Pregestational Type II DM    Poor control B-C or D-R-F-T  or  Type I DM     Gestational hypertension 07/03/2020   Gonorrhea 02/02/2018   Headache    Ovarian cyst      Past Surgical History:  Procedure Laterality Date   CESAREAN SECTION N/A 12/02/2020   Procedure: CESAREAN SECTION;  Surgeon: Venora Maples, MD;  [facility]: [facility] LD [facility]S;  Service: Obstetrics;  Laterality: N/A;   LAPAROTOMY Right 09/05/2022   Procedure: EXPLORATORY LAPAROTOMY WITH RESECTION OF CORNUAL ECTOPIC PREGNANCY;  Surgeon: Tereso Newcomer, MD;  Location: [facility] OR;  Service: Gynecology;  Laterality: Right;   UNILATERAL SALPINGECTOMY Right 09/05/2022   Procedure: UNILATERAL SALPINGECTOMY;  Surgeon: Tereso Newcomer, MD;  Location: [facility] OR;  Service: Gynecology;  Laterality: Right;    Family History  Problem Relation Age of Onset   Hypertension Mother    Healthy Father     Social History   Tobacco Use   Smoking status: Never   Smokeless tobacco: Never  Vaping Use   Vaping status: Never Used  Substance Use Topics   Alcohol use: Yes    Comment: few times a wk, drank yesterday 2/21   Drug use: Yes    Frequency: 14.0 times per week    Types: Marijuana    Comment: daily    ROS Refer to HPI for ROS details.  Objective:   Vitals: BP 112/61   Pulse 97   Temp 99.1 F (37.3 C)   Resp 18   LMP 07/23/2023 (Approximate)   SpO2 97%   Physical Exam Vitals and nursing note reviewed.  Constitutional:      General: She is not in acute distress.    Appearance: Normal appearance. She is well-developed. She is not ill-appearing or toxic-appearing.  HENT:     Head: Normocephalic.  Cardiovascular:     Rate and Rhythm: Normal rate.  Pulmonary:     Effort: Pulmonary effort is  normal. No respiratory distress.  Abdominal:     Palpations: Abdomen is soft.     Tenderness: There is no abdominal tenderness.  Genitourinary:    Vagina: No vaginal discharge.  Skin:    General: Skin is warm and dry.  Neurological:     General: No focal deficit present.     Mental Status: She is alert and oriented to person, place, and time.  Psychiatric:        Mood and Affect: Mood normal.     Procedures  Results for orders placed or performed during the hospital encounter of 08/25/23 (from the past 24 hours)  POCT urine pregnancy     Status: Normal   Collection Time: 08/25/23 12:06 PM  Result Value Ref Range   Preg Test, Ur Negative     Assessment and Plan :   PDMP not reviewed this encounter.  1. Screening for STD (sexually transmitted disease)   2. Pregnancy examination or test, negative result    1. Screening for STD (sexually transmitted disease) (Primary) - Cervicovaginal swab collected in UC sent to lab for further testing results should be available in 1 to 2 days.  2. Pregnancy examination or test, negative result - POCT urine pregnancy completed in UC is negative for hCG. -Continue to monitor symptoms for any change in severity if there is any escalation of current symptoms or development of new symptoms follow-up in ER for further evaluation and management.  Lucky Cowboy   [place], [place], [place] 08/25/23 1212

## 2023-08-25 NOTE — ED Triage Notes (Signed)
 PT wants STD testing with out  Sx's. Pt last period was the end of FEB.

## 2023-08-26 ENCOUNTER — Telehealth (HOSPITAL_COMMUNITY): Payer: Self-pay

## 2023-08-26 LAB — CERVICOVAGINAL ANCILLARY ONLY
Bacterial Vaginitis (gardnerella): POSITIVE — AB
Candida Glabrata: NEGATIVE
Candida Vaginitis: NEGATIVE
Chlamydia: NEGATIVE
Comment: NEGATIVE
Comment: NEGATIVE
Comment: NEGATIVE
Comment: NEGATIVE
Comment: NEGATIVE
Comment: NORMAL
Neisseria Gonorrhea: NEGATIVE
Trichomonas: POSITIVE — AB

## 2023-08-26 MED ORDER — METRONIDAZOLE 500 MG PO TABS: 500.0000 mg | ORAL_TABLET | Freq: Two times a day (BID) | ORAL | 0 refills | Status: AC

## 2023-08-26 NOTE — Telephone Encounter (Signed)
 Per protocol, pt requires tx with metronidazole. Reviewed with patient, verified pharmacy, prescription sent.

## 2023-11-12 ENCOUNTER — Encounter (HOSPITAL_COMMUNITY): Payer: Self-pay

## 2023-11-12 ENCOUNTER — Ambulatory Visit (HOSPITAL_COMMUNITY): Admission: EM | Admit: 2023-11-12 | Discharge: 2023-11-12 | Disposition: A | Discharge status: Home / Self Care

## 2023-11-12 DIAGNOSIS — Z3202 Encounter for pregnancy test, result negative: Secondary | ICD-10-CM | POA: Diagnosis not present

## 2023-11-12 DIAGNOSIS — U071 COVID-19: Secondary | ICD-10-CM

## 2023-11-12 DIAGNOSIS — Z113 Encounter for screening for infections with a predominantly sexual mode of transmission: Secondary | ICD-10-CM

## 2023-11-12 HISTORY — DX: Unspecified ectopic pregnancy without intrauterine pregnancy: O00.90

## 2023-11-12 LAB — POCT URINE PREGNANCY: Preg Test, Ur: NEGATIVE

## 2023-11-12 LAB — POC COVID19/FLU A&B COMBO
Covid Antigen, POC: POSITIVE — AB
Influenza A Antigen, POC: NEGATIVE
Influenza B Antigen, POC: NEGATIVE

## 2023-11-12 NOTE — Discharge Instructions (Signed)
  1. COVID-19 (Primary) - POC Covid19/Flu A&B Antigen performed in UC is positive for COVID-19, negative for influenza  2. Screening for STD (sexually transmitted disease) - Cervicovaginal collected in UC and sent to lab for further testing results should be available in 2 to 3 days. - POCT urine pregnancy completed in UC is negative for pregnancy -Continue to monitor symptoms for any change in severity if there is any escalation of current symptoms or development of new symptoms follow-up in ER for further evaluation and management.

## 2023-11-12 NOTE — ED Provider Notes (Signed)
 [facility]  Note:  This document was prepared using Dragon voice recognition software and may include unintentional dictation errors.  MRN: [id number] DOB: 2001/05/05  Subjective:   Bethany Avery is a 23 y.o. female presenting for cough, nasal congestion, nausea, headache, body aches, abdominal cramping x 5 days.  Patient reports that most of her symptoms have subsided but her mother tested positive for COVID yesterday which prompted her to come for evaluation of herself and her son for COVID.  Patient would also like STD testing due to possible exposure to trichomonas from her partner.  Patient denies any current symptoms but would like STD testing and pregnancy testing today in urgent care.  No current facility-administered medications for this encounter. No current outpatient medications on file.   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 Cassandria Clever  INDICATION Growth U/S BPP weekly DELIVERY RECOMMENDATION (GA)  Diabetes   A1 - good control     A2 - good control     A2  - poor control or poor compliance    (Macrosomia or polyhydramnios)     A2/B and B-C Pregestational Type II DM    Poor control B-C or D-R-F-T  or  Type I DM     Gestational hypertension 07/03/2020   Gonorrhea 02/02/2018   Headache    Ovarian cyst      Past Surgical History:  Procedure Laterality Date   CESAREAN SECTION N/A 12/02/2020   Procedure: CESAREAN SECTION;  Surgeon: Teena Feast, MD;  Location: [facility];  Service: Obstetrics;  Laterality: N/A;   LAPAROTOMY  Right 09/05/2022   Procedure: EXPLORATORY LAPAROTOMY WITH RESECTION OF CORNUAL ECTOPIC PREGNANCY;  Surgeon: Julianne Octave, MD;  Location: [facility];  Service: Gynecology;  Laterality: Right;   UNILATERAL SALPINGECTOMY Right 09/05/2022   Procedure: UNILATERAL SALPINGECTOMY;  Surgeon: Julianne Octave, MD;  Location: [facility];  Service: Gynecology;  Laterality: Right;    Family History  Problem Relation Age of Onset   Hypertension Mother    Healthy Father     Social History   Tobacco Use   Smoking status: Never   Smokeless tobacco: Never  Vaping Use   Vaping status: Never Used  Substance Use Topics   Alcohol use: Yes    Comment: few times a wk, drank yesterday 2/21   Drug use: Yes    Frequency: 14.0 times per week    Types: Marijuana    Comment: daily    ROS Refer to HPI for ROS details.  Objective:   Vitals: BP 110/78 (BP Location: Right Arm)   Pulse 77   Temp 97.8 F (36.6 C) (Oral)   Resp 18   Ht 5' 5 (1.651 m)   Wt 250 lb (113.4 kg)   LMP 09/27/2023 (Approximate)   SpO2 98%   Breastfeeding No   BMI 41.60 kg/m   Physical Exam Vitals and nursing note reviewed.  Constitutional:  General: She is not in acute distress.    Appearance: Normal appearance. She is well-developed. She is not ill-appearing or toxic-appearing.  HENT:     Head: Normocephalic and atraumatic.     Nose: Nose normal. No congestion or rhinorrhea.     Mouth/Throat:     Mouth: Mucous membranes are moist.   Cardiovascular:     Rate and Rhythm: Normal rate and regular rhythm.     Heart sounds: Normal heart sounds. No murmur heard. Pulmonary:     Effort: Pulmonary effort is normal. No respiratory distress.     Breath sounds: Normal breath sounds. No stridor. No wheezing, rhonchi or rales.   Skin:    General: Skin is warm and dry.   Neurological:     General: No focal deficit present.     Mental Status: She is alert and oriented to person, place, and time.   Psychiatric:         Mood and Affect: Mood normal.        Behavior: Behavior normal.     Procedures  Results for orders placed or performed during the hospital encounter of 11/12/23 (from the past 24 hours)  POC Covid19/Flu A&B Antigen     Status: Abnormal   Collection Time: 11/12/23  5:18 PM  Result Value Ref Range   Influenza A Antigen, POC Negative Negative   Influenza B Antigen, POC Negative Negative   Covid Antigen, POC Positive (A) Negative  POCT urine pregnancy     Status: None   Collection Time: 11/12/23  5:18 PM  Result Value Ref Range   Preg Test, Ur Negative Negative    No results found.   Assessment and Plan :     Discharge Instructions       1. COVID-19 (Primary) - POC Covid19/Flu A&B Antigen performed in UC is positive for COVID-19, negative for influenza  2. Screening for STD (sexually transmitted disease) - Cervicovaginal collected in UC and sent to lab for further testing results should be available in 2 to 3 days. - POCT urine pregnancy completed in UC is negative for pregnancy -Continue to monitor symptoms for any change in severity if there is any escalation of current symptoms or development of new symptoms follow-up in ER for further evaluation and management.      Nahuel Wilbert B Edis Huish   Kayde Atkerson, [place], [place] 11/12/23 1743

## 2023-11-12 NOTE — ED Triage Notes (Addendum)
 Chief Complaint: Had a cough, headache, body aches, and SOB but that has passed. States after this her entire house got sick and her mother went to be tested. Current symptoms include nausea, abdominal cramping, and reduced appetite.   Sick exposure: Yes- Mother was positive for COVID; Partner had Trich  Onset: 5 days ago.   Prescriptions or OTC medications tried: No    New foods, medications, or products: No  Recent Travel: No

## 2023-11-15 ENCOUNTER — Ambulatory Visit (HOSPITAL_COMMUNITY): Payer: Self-pay

## 2023-11-15 LAB — CERVICOVAGINAL ANCILLARY ONLY
Bacterial Vaginitis (gardnerella): POSITIVE — AB
Candida Glabrata: NEGATIVE
Candida Vaginitis: POSITIVE — AB
Chlamydia: NEGATIVE
Comment: NEGATIVE
Comment: NEGATIVE
Comment: NEGATIVE
Comment: NEGATIVE
Comment: NEGATIVE
Comment: NORMAL
Neisseria Gonorrhea: NEGATIVE
Trichomonas: NEGATIVE

## 2023-12-23 ENCOUNTER — Ambulatory Visit (HOSPITAL_COMMUNITY)
Admission: EM | Admit: 2023-12-23 | Discharge: 2023-12-23 | Disposition: A | Discharge status: Home / Self Care | Attending: Emergency Medicine | Admitting: Emergency Medicine

## 2023-12-23 ENCOUNTER — Encounter (HOSPITAL_COMMUNITY): Payer: Self-pay

## 2023-12-23 DIAGNOSIS — N898 Other specified noninflammatory disorders of vagina: Secondary | ICD-10-CM | POA: Diagnosis not present

## 2023-12-23 DIAGNOSIS — Z113 Encounter for screening for infections with a predominantly sexual mode of transmission: Secondary | ICD-10-CM | POA: Diagnosis not present

## 2023-12-23 DIAGNOSIS — N6489 Other specified disorders of breast: Secondary | ICD-10-CM | POA: Insufficient documentation

## 2023-12-23 DIAGNOSIS — M549 Dorsalgia, unspecified: Secondary | ICD-10-CM

## 2023-12-23 DIAGNOSIS — M545 Low back pain, unspecified: Secondary | ICD-10-CM | POA: Diagnosis not present

## 2023-12-23 DIAGNOSIS — G8929 Other chronic pain: Secondary | ICD-10-CM | POA: Insufficient documentation

## 2023-12-23 MED ORDER — TRIAMCINOLONE ACETONIDE 40 MG/ML IJ SUSP
40.0000 mg | Freq: Once | INTRAMUSCULAR | Status: AC
  Administered 2023-12-23: 40 mg via INTRAMUSCULAR

## 2023-12-23 MED ORDER — TRIAMCINOLONE ACETONIDE 40 MG/ML IJ SUSP
INTRAMUSCULAR | Status: AC
Start: 2023-12-23 — End: 2023-12-23
  Filled 2023-12-23: qty 1

## 2023-12-23 MED ORDER — DEXAMETHASONE SODIUM PHOSPHATE 10 MG/ML IJ SOLN: 10.0000 mg | Freq: Once | INTRAMUSCULAR | Status: DC

## 2023-12-23 MED ORDER — FLUCONAZOLE 150 MG PO TABS: 150.0000 mg | ORAL_TABLET | Freq: Every day | ORAL | 0 refills | Status: DC

## 2023-12-23 NOTE — Discharge Instructions (Addendum)
 We have given you a steroid injection to help with your pain.  Take the muscle relaxer sparingly and follow-up with physical therapy for your chronic back pain.  Heat, gentle stretching and weight training may help strengthen the muscles.  Take the Diflucan  for your vaginal itching.  Will contact you if anything results is abnormal with your vaginal swab.  Return to clinic for any new or urgent symptoms.

## 2023-12-23 NOTE — ED Provider Notes (Signed)
 [facility]    CSN: [id number] Arrival date & time: 12/23/23  1856      History   Chief Complaint Chief Complaint  Patient presents with   STD testing   Back Pain   Vaginal Itching    HPI Bethany Avery is a 23 y.o. female.   Patient presents to clinic over concern of continued chronic back pain as well as vaginal itching.  Vaginal itching has been for the past 2 days.  Does not have vaginal odor or vaginal discharge.  She is sexually active and would like STI screening.  Was seen by her family med provider on 7/3 for chronic low back pain and trapezius strain.  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  Her breasts are pendulous and this seems to cause some back pain as well.  Has not had any recent trauma or falls.  Has been taking Robaxin, was prescribed 120 of these up to 4 times daily by her PCP.  Does have PT scheduled in August.  Does not have any urinary symptoms.  The history is provided by the patient and medical records.  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 Fredia Fresh  INDICATION Growth U/S BPP weekly DELIVERY RECOMMENDATION (GA)  Diabetes   A1 - good control     A2 - good control     A2  - poor control or poor compliance    (Macrosomia or polyhydramnios)     A2/B and B-C Pregestational Type II DM    Poor control B-C or D-R-F-T  or  Type I DM     Gestational hypertension 07/03/2020   Gonorrhea 02/02/2018   Headache    Ovarian cyst     Patient Active Problem List   Diagnosis Date Noted   History of ectopic pregnancy  09/05/2022   Adjustment disorder with mixed anxiety and depressed mood 04/02/2020   Marijuana user 06/22/2018   Attention deficit hyperactivity disorder 08/24/2013    Past Surgical History:  Procedure Laterality Date   CESAREAN SECTION N/A 12/02/2020   Procedure: CESAREAN SECTION;  Surgeon: Lola Donnice HERO, MD;  [facility];  Service: Obstetrics;  Laterality: N/A;   LAPAROTOMY Right 09/05/2022   Procedure: EXPLORATORY LAPAROTOMY WITH RESECTION OF CORNUAL ECTOPIC PREGNANCY;  Surgeon: Herchel Gloris LABOR, MD;  Location: [facility];  Service: Gynecology;  Laterality: Right;   UNILATERAL SALPINGECTOMY Right 09/05/2022   Procedure: UNILATERAL SALPINGECTOMY;  Surgeon: Herchel Gloris LABOR, MD;  Location: [facility];  Service: Gynecology;  Laterality: Right;    OB History     Gravida  3   Para  1   Term  1   Preterm      AB  2   Living  1      SAB  1   IAB      Ectopic  1   Multiple  0   Live Births  1  Home Medications    Prior to Admission medications   Medication Sig Start Date End Date Taking? Authorizing Provider  fluconazole  (DIFLUCAN ) 150 MG tablet Take 1 tablet (150 mg total) by mouth daily. 12/23/23  Yes Jung Ingerson  N, FNP  methocarbamol (ROBAXIN) 500 MG tablet Take 500 mg by mouth 4 (four) times daily.   Yes [provider]  metoCLOPramide  (REGLAN ) 10 MG tablet Take 1 tablet (10 mg total) by mouth every 8 (eight) hours as needed (take with tylenol  for headaches). 07/03/20 12/01/20  Jerilynn Longs, NP    Family History Family History  Problem Relation Age of Onset   Hypertension Mother    Healthy Father     Social History Social History   Tobacco Use   Smoking status: Never   Smokeless tobacco: Never  Vaping Use   Vaping status: Never Used  Substance Use Topics   Alcohol use: Yes   Drug use: Yes    Frequency: 14.0 times per week    Types: Marijuana    Comment: daily     Allergies   Nickel   Review of  Systems Review of Systems  Per HPI  Physical Exam Triage Vital Signs ED Triage Vitals  Encounter Vitals Group     BP 12/23/23 2021 113/76     Girls Systolic BP Percentile --      Girls Diastolic BP Percentile --      Boys Systolic BP Percentile --      Boys Diastolic BP Percentile --      Pulse Rate 12/23/23 2021 81     Resp 12/23/23 2021 16     Temp 12/23/23 2021 98.3 F (36.8 C)     Temp Source 12/23/23 2021 Oral     SpO2 12/23/23 2021 96 %     Weight --      Height --      Head Circumference --      Peak Flow --      Pain Score 12/23/23 2020 9     Pain Loc --      Pain Education --      Exclude from Growth Chart --    No data found.  Updated Vital Signs BP 113/76 (BP Location: Right Arm)   Pulse 81   Temp 98.3 F (36.8 C) (Oral)   Resp 16   LMP 12/23/2023 (Exact Date)   SpO2 96%   Visual Acuity Right Eye Distance:   Left Eye Distance:   Bilateral Distance:    Right Eye Near:   Left Eye Near:    Bilateral Near:     Physical Exam Vitals and nursing note reviewed.  Constitutional:      Appearance: Normal appearance.  HENT:     Head: Normocephalic and atraumatic.     Right Ear: External ear normal.     Left Ear: External ear normal.     Nose: Nose normal.     Mouth/Throat:     Mouth: Mucous membranes are moist.  Eyes:     Conjunctiva/sclera: Conjunctivae normal.  Cardiovascular:     Rate and Rhythm: Normal rate.  Pulmonary:     Effort: Pulmonary effort is normal. No respiratory distress.  Musculoskeletal:        General: Tenderness present.       Back:     Comments: Diffuse low back pain that is tender to palpation, consistent presentation with chronic low back pain.  Skin:    General: Skin is warm and dry.  Neurological:     General: No focal deficit present.     Mental Status: She is alert and oriented to person, place, and time.  Psychiatric:        Mood and Affect: Mood normal.        Behavior: Behavior normal.      UC Treatments /  Results  Labs (all labs ordered are listed, but only abnormal results are displayed) Labs Reviewed  CERVICOVAGINAL ANCILLARY ONLY    EKG   Radiology No results found.  Procedures Procedures (including critical care time)  Medications Ordered in UC Medications  triamcinolone  acetonide (KENALOG -40) injection 40 mg (40 mg Intramuscular Given 12/23/23 2046)    Initial Impression / Assessment and Plan / UC Course  I have reviewed the triage vital signs and the nursing notes.  Pertinent labs & imaging results that were available during my care of the patient were reviewed by me and considered in my medical decision making (see chart for details).  Vitals and triage reviewed, patient is hemodynamically stable.  Chronic low back pain, atraumatic, imaging deferred.  IM steroid given in clinic to help with inflammation and encouraged PT for strengthening.  Patient attempting weight loss.  Denies urinary symptoms, UA deferred.  Patient is on her menstrual cycle and is confident she is not pregnant.  Vaginal itching, treated empirically with Diflucan .  STI screening obtained and sent will contact if treatment is needed.  Plan of care, follow-up care return precautions given, no questions at this time.  Work note provided.     Final Clinical Impressions(s) / UC Diagnoses   Final diagnoses:  Back pain, unspecified back location, unspecified back pain laterality, unspecified chronicity  Screening examination for sexually transmitted disease  Vaginal itching     Discharge Instructions      We have given you a steroid injection to help with your pain.  Take the muscle relaxer sparingly and follow-up with physical therapy for your chronic back pain.  Heat, gentle stretching and weight training may help strengthen the muscles.  Take the Diflucan  for your vaginal itching.  Will contact you if anything results is abnormal with your vaginal swab.  Return to clinic for any new or urgent  symptoms.     ED Prescriptions     Medication Sig Dispense Auth. Provider   fluconazole  (DIFLUCAN ) 150 MG tablet Take 1 tablet (150 mg total) by mouth daily. 1 tablet Dreama, Ravyn Nikkel  N, FNP      PDMP not reviewed this encounter.   Dreama Lavanda SAILOR, FNP 12/23/23 [phone]

## 2023-12-23 NOTE — ED Triage Notes (Signed)
 Patient reports that she has been having lower back pain and sometimes right shoulder pain. X 1 week. 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  Patient states she is taking Robaxin and helps for approx 1 hour and then the pain continues.   Patient also c/o vaginal itching x 2 days and is requesting STD testing.

## 2023-12-27 ENCOUNTER — Ambulatory Visit (HOSPITAL_COMMUNITY): Payer: Self-pay

## 2023-12-27 LAB — CERVICOVAGINAL ANCILLARY ONLY
Bacterial Vaginitis (gardnerella): POSITIVE — AB
Candida Glabrata: NEGATIVE
Candida Vaginitis: POSITIVE — AB
Chlamydia: NEGATIVE
Comment: NEGATIVE
Comment: NEGATIVE
Comment: NEGATIVE
Comment: NEGATIVE
Comment: NEGATIVE
Comment: NORMAL
Neisseria Gonorrhea: NEGATIVE
Trichomonas: NEGATIVE

## 2023-12-27 MED ORDER — METRONIDAZOLE 500 MG PO TABS: 500.0000 mg | ORAL_TABLET | Freq: Two times a day (BID) | ORAL | 0 refills | Status: DC

## 2023-12-28 MED ORDER — METRONIDAZOLE 0.75 % VA GEL: 1.0000 | Freq: Every day | VAGINAL | 0 refills | Status: AC

## 2024-02-16 ENCOUNTER — Ambulatory Visit (HOSPITAL_COMMUNITY)
Admission: EM | Admit: 2024-02-16 | Discharge: 2024-02-16 | Disposition: A | Discharge status: Home / Self Care | Attending: Physician Assistant | Admitting: Physician Assistant

## 2024-02-16 ENCOUNTER — Encounter (HOSPITAL_COMMUNITY): Payer: Self-pay

## 2024-02-16 DIAGNOSIS — R051 Acute cough: Secondary | ICD-10-CM

## 2024-02-16 DIAGNOSIS — R197 Diarrhea, unspecified: Secondary | ICD-10-CM | POA: Diagnosis not present

## 2024-02-16 DIAGNOSIS — J4521 Mild intermittent asthma with (acute) exacerbation: Secondary | ICD-10-CM

## 2024-02-16 LAB — POCT URINE PREGNANCY: Preg Test, Ur: NEGATIVE

## 2024-02-16 MED ORDER — BENZONATATE 100 MG PO CAPS: 100.0000 mg | ORAL_CAPSULE | Freq: Three times a day (TID) | ORAL | 0 refills | Status: DC

## 2024-02-16 MED ORDER — AZITHROMYCIN 250 MG PO TABS: 250.0000 mg | ORAL_TABLET | Freq: Every day | ORAL | 0 refills | Status: DC

## 2024-02-16 NOTE — Discharge Instructions (Addendum)
 Take antibiotic as prescribed. Can take Tessalon  as needed for cough. Continue with albuterol  inhaler as needed. For diarrhea recommend Imodium  which is over-the-counter.  Make sure you are drinking plenty of fluids.

## 2024-02-16 NOTE — ED Triage Notes (Signed)
 Pt states diarrhea and vomit x1,  States she also has been having a cough. States she is wants to be tested for STD's and have a pregnancy test.

## 2024-02-17 ENCOUNTER — Ambulatory Visit (HOSPITAL_COMMUNITY): Payer: Self-pay

## 2024-02-17 LAB — CERVICOVAGINAL ANCILLARY ONLY
Bacterial Vaginitis (gardnerella): POSITIVE — AB
Candida Glabrata: NEGATIVE
Candida Vaginitis: NEGATIVE
Chlamydia: NEGATIVE
Comment: NEGATIVE
Comment: NEGATIVE
Comment: NEGATIVE
Comment: NEGATIVE
Comment: NEGATIVE
Comment: NORMAL
Neisseria Gonorrhea: NEGATIVE
Trichomonas: NEGATIVE

## 2024-03-08 NOTE — ED Provider Notes (Signed)
 [facility]    CSN: [id number] Arrival date & time: 02/16/24  1828      History   Chief Complaint Chief Complaint  Patient presents with   Diarrhea    HPI Bethany Avery is a 23 y.o. female.   Patient presents with diarrhea and vomiting that started about a week ago.  She reports only vomiting once.  She denies abdominal pain, fever, chills.  She also reports a cough intermittently.  She does have a history of asthma reports some worsening of wheezing recently.  Has been using her inhaler with some relief.  She is requesting STD testing today.  Reports she is asymptomatic.  She is also requesting a pregnancy test.  Last LMP 01/12/2024.    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 Fredia Fresh  INDICATION Growth U/S BPP weekly DELIVERY RECOMMENDATION (GA)  Diabetes   A1 - good control     A2 - good control     A2  - poor control or poor compliance    (Macrosomia or polyhydramnios)     A2/B and B-C Pregestational Type II DM    Poor control B-C or D-R-F-T  or  Type I DM     Gestational hypertension 07/03/2020   Gonorrhea 02/02/2018   Headache    Ovarian cyst     Patient Active Problem List   Diagnosis Date Noted   History of ectopic pregnancy 09/05/2022   Adjustment disorder with mixed anxiety and depressed mood 04/02/2020   Marijuana user 06/22/2018   Attention deficit hyperactivity disorder 08/24/2013    Past Surgical History:  Procedure Laterality Date   CESAREAN SECTION N/A 12/02/2020   Procedure: CESAREAN SECTION;  Surgeon: Lola Donnice HERO, MD;  [facility];  Service:  Obstetrics;  Laterality: N/A;   LAPAROTOMY Right 09/05/2022   Procedure: EXPLORATORY LAPAROTOMY WITH RESECTION OF CORNUAL ECTOPIC PREGNANCY;  Surgeon: Herchel Gloris LABOR, MD;  Location: [facility];  Service: Gynecology;  Laterality: Right;   UNILATERAL SALPINGECTOMY Right 09/05/2022   Procedure: UNILATERAL SALPINGECTOMY;  Surgeon: Herchel Gloris LABOR, MD;  Location: [facility];  Service: Gynecology;  Laterality: Right;    OB History     Gravida  3   Para  1   Term  1   Preterm      AB  2   Living  1      SAB  1   IAB      Ectopic  1   Multiple  0   Live Births  1            Home Medications    Prior to Admission medications   Medication Sig Start Date End Date Taking? Authorizing Provider  azithromycin  (ZITHROMAX  Z-PAK) 250 MG tablet Take 1 tablet (250 mg total) by mouth daily. Take 2 tablets by mouth on the first day and then 1 by mouth daily. 02/16/24  Yes Ward, Harlene PEDLAR, PA-C  benzonatate  (TESSALON ) 100 MG capsule Take 1 capsule (100  mg total) by mouth every 8 (eight) hours. 02/16/24  Yes Ward, Harlene PEDLAR, PA-C  fluconazole  (DIFLUCAN ) 150 MG tablet Take 1 tablet (150 mg total) by mouth daily. 12/23/23   Dreama, [place]  N, FNP  methocarbamol (ROBAXIN) 500 MG tablet Take 500 mg by mouth 4 (four) times daily.    [provider]  metoCLOPramide  (REGLAN ) 10 MG tablet Take 1 tablet (10 mg total) by mouth every 8 (eight) hours as needed (take with tylenol  for headaches). 07/03/20 12/01/20  Jerilynn Longs, NP    Family History Family History  Problem Relation Age of Onset   Hypertension Mother    Healthy Father     Social History Social History   Tobacco Use   Smoking status: Never   Smokeless tobacco: Never  Vaping Use   Vaping status: Never Used  Substance Use Topics   Alcohol use: Yes   Drug use: Yes    Frequency: 14.0 times per week    Types: Marijuana    Comment: daily     Allergies   Nickel   Review of Systems Review of Systems  Constitutional:   Negative for chills and fever.  HENT:  Negative for ear pain and sore throat.   Eyes:  Negative for pain and visual disturbance.  Respiratory:  Positive for cough. Negative for shortness of breath.   Cardiovascular:  Negative for chest pain and palpitations.  Gastrointestinal:  Positive for diarrhea and vomiting. Negative for abdominal pain.  Genitourinary:  Negative for dysuria and hematuria.  Musculoskeletal:  Negative for arthralgias and back pain.  Skin:  Negative for color change and rash.  Neurological:  Negative for seizures and syncope.  All other systems reviewed and are negative.    Physical Exam Triage Vital Signs ED Triage Vitals  Encounter Vitals Group     BP 02/16/24 1925 119/82     Girls Systolic BP Percentile --      Girls Diastolic BP Percentile --      Boys Systolic BP Percentile --      Boys Diastolic BP Percentile --      Pulse Rate 02/16/24 1925 82     Resp 02/16/24 1925 16     Temp 02/16/24 1925 98.2 F (36.8 C)     Temp Source 02/16/24 1925 Oral     SpO2 02/16/24 1925 98 %     Weight --      Height --      Head Circumference --      Peak Flow --      Pain Score 02/16/24 1924 0     Pain Loc --      Pain Education --      Exclude from Growth Chart --    No data found.  Updated Vital Signs BP 119/82 (BP Location: Left Arm)   Pulse 82   Temp 98.2 F (36.8 C) (Oral)   Resp 16   LMP 01/12/2024 (Approximate)   SpO2 98%   Visual Acuity Right Eye Distance:   Left Eye Distance:   Bilateral Distance:    Right Eye Near:   Left Eye Near:    Bilateral Near:     Physical Exam Vitals and nursing note reviewed.  Constitutional:      General: She is not in acute distress.    Appearance: She is well-developed.  HENT:     Head: Normocephalic and atraumatic.  Eyes:     Conjunctiva/sclera: Conjunctivae normal.  Cardiovascular:     Rate and Rhythm:  Normal rate and regular rhythm.     Heart sounds: No murmur heard. Pulmonary:     Effort:  Pulmonary effort is normal. No respiratory distress.     Breath sounds: Normal breath sounds.  Abdominal:     Palpations: Abdomen is soft.     Tenderness: There is no abdominal tenderness.  Musculoskeletal:        General: No swelling.     Cervical back: Neck supple.  Skin:    General: Skin is warm and dry.     Capillary Refill: Capillary refill takes less than 2 seconds.  Neurological:     Mental Status: She is alert.  Psychiatric:        Mood and Affect: Mood normal.      UC Treatments / Results  Labs (all labs ordered are listed, but only abnormal results are displayed) Labs Reviewed  CERVICOVAGINAL ANCILLARY ONLY - Abnormal; Notable for the following components:      Result Value   Bacterial Vaginitis (gardnerella) Positive (*)    All other components within normal limits  POCT URINE PREGNANCY - Normal    EKG   Radiology No results found.  Procedures Procedures (including critical care time)  Medications Ordered in UC Medications - No data to display  Initial Impression / Assessment and Plan / UC Course  I have reviewed the triage vital signs and the nursing notes.  Pertinent labs & imaging results that were available during my care of the patient were reviewed by me and considered in my medical decision making (see chart for details).     Will prescribe Tessalon  for cough as needed.  Will prescribe antibiotic and advised patient to continue albuterol  inhaler as needed.  Supportive care discussed.  STD test in clinic today, pregnancy test negative.  Return precautions discussed. Final Clinical Impressions(s) / UC Diagnoses   Final diagnoses:  Mild intermittent asthma with acute exacerbation  Acute cough  Diarrhea, unspecified type     Discharge Instructions      Take antibiotic as prescribed. Can take Tessalon  as needed for cough. Continue with albuterol  inhaler as needed. For diarrhea recommend Imodium  which is over-the-counter.  Make sure you are  drinking plenty of fluids.   ED Prescriptions     Medication Sig Dispense Auth. Provider   azithromycin  (ZITHROMAX  Z-PAK) 250 MG tablet Take 1 tablet (250 mg total) by mouth daily. Take 2 tablets by mouth on the first day and then 1 by mouth daily. 6 tablet Ward, Challis Crill Z, PA-C   benzonatate  (TESSALON ) 100 MG capsule Take 1 capsule (100 mg total) by mouth every 8 (eight) hours. 21 capsule Ward, Aleisha Paone Z, PA-C      PDMP not reviewed this encounter.   Ward, Harlene PEDLAR, PA-C 03/08/24 1526

## 2024-04-01 ENCOUNTER — Ambulatory Visit (HOSPITAL_COMMUNITY): Admission: EM | Admit: 2024-04-01 | Discharge: 2024-04-01 | Disposition: A | Discharge status: Home / Self Care

## 2024-04-01 ENCOUNTER — Encounter (HOSPITAL_COMMUNITY): Payer: Self-pay

## 2024-04-01 DIAGNOSIS — X58XXXD Exposure to other specified factors, subsequent encounter: Secondary | ICD-10-CM | POA: Diagnosis not present

## 2024-04-01 DIAGNOSIS — Z113 Encounter for screening for infections with a predominantly sexual mode of transmission: Secondary | ICD-10-CM | POA: Diagnosis present

## 2024-04-01 DIAGNOSIS — Z3202 Encounter for pregnancy test, result negative: Secondary | ICD-10-CM | POA: Diagnosis not present

## 2024-04-01 DIAGNOSIS — M5459 Other low back pain: Secondary | ICD-10-CM | POA: Diagnosis present

## 2024-04-01 DIAGNOSIS — S39012D Strain of muscle, fascia and tendon of lower back, subsequent encounter: Secondary | ICD-10-CM | POA: Insufficient documentation

## 2024-04-01 LAB — POCT URINE PREGNANCY: Preg Test, Ur: NEGATIVE

## 2024-04-01 MED ORDER — DICLOFENAC SODIUM 50 MG PO TBEC: 50.0000 mg | DELAYED_RELEASE_TABLET | Freq: Two times a day (BID) | ORAL | 1 refills | Status: DC

## 2024-04-01 MED ORDER — BACLOFEN 10 MG PO TABS: 10.0000 mg | ORAL_TABLET | Freq: Three times a day (TID) | ORAL | 0 refills | Status: DC

## 2024-04-01 NOTE — ED Provider Notes (Signed)
 [facility]  Note:  This document was prepared using Conservation officer, historic buildings and may include unintentional dictation errors.  MRN: [id number] DOB: 02-05-2001  Subjective:   Bethany Avery is a 23 y.o. female presenting for evaluation of left-sided lower back pain which radiates down bilateral legs when walking.  Patient reports that symptoms first started this morning when she woke up.  Patient reports that she works 2 jobs and is on her feet for long hours every day.  Patient reports she has taken some ibuprofen  with mild relief to symptoms.  Patient reports past history of lower back pain and injury.  Denies any recent exacerbation of injuries.  Patient would also like testing for STDs today swab only, no blood work, denies any current symptoms or known exposure.  No current facility-administered medications for this encounter.  Current Outpatient Medications:    albuterol  (VENTOLIN  HFA) 108 (90 Base) MCG/ACT inhaler, Inhale 2 puffs into the lungs every 6 (six) hours as needed for wheezing., Disp: , Rfl:    WEGOVY 0.5 MG/0.5ML SOAJ SQ injection, Inject 0.5 mg into the skin once a week., Disp: , Rfl:    baclofen (LIORESAL) 10 MG tablet, Take 1 tablet (10 mg total) by mouth 3 (three) times daily., Disp: 30 each, Rfl: 0   diclofenac  (VOLTAREN ) 50 MG EC tablet, Take 1 tablet (50 mg total) by mouth 2 (two) times daily., Disp: 30 tablet, Rfl: 1   Allergies  Allergen Reactions   Nickel Rash    Past Medical History:  Diagnosis Date   ADHD (attention deficit hyperactivity disorder)    Adolescent behavior problem    Anxiety    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 Fredia Fresh  INDICATION Growth U/S BPP weekly DELIVERY  RECOMMENDATION (GA)  Diabetes   A1 - good control     A2 - good control     A2  - poor control or poor compliance    (Macrosomia or polyhydramnios)     A2/B and B-C Pregestational Type II DM    Poor control B-C or D-R-F-T  or  Type I DM     Gestational hypertension 07/03/2020   Gonorrhea 02/02/2018   Headache    Ovarian cyst      Past Surgical History:  Procedure Laterality Date   CESAREAN SECTION N/A 12/02/2020   Procedure: CESAREAN SECTION;  Surgeon: Lola Donnice HERO, MD;  Location: [facility];  Service: Obstetrics;  Laterality: N/A;   LAPAROTOMY Right 09/05/2022   Procedure: EXPLORATORY LAPAROTOMY WITH RESECTION OF CORNUAL ECTOPIC PREGNANCY;  Surgeon: Herchel Gloris LABOR, MD;  Location: [facility];  Service: Gynecology;  Laterality: Right;   UNILATERAL SALPINGECTOMY Right 09/05/2022   Procedure: UNILATERAL SALPINGECTOMY;  Surgeon: Herchel Gloris LABOR, MD;  Location: [facility];  Service: Gynecology;  Laterality: Right;    Family History  Problem Relation Age of Onset   Hypertension Mother    Healthy Father     Social History   Tobacco Use   Smoking status: Never   Smokeless tobacco: Never  Vaping Use   Vaping status: Never Used  Substance Use Topics   Alcohol use: Yes  Drug use: Yes    Frequency: 14.0 times per week    Types: Marijuana    Comment: daily    ROS Refer to HPI for ROS details.  Objective:    Vitals: BP 105/71 (BP Location: Right Arm)   Pulse 85   Temp 98.4 F (36.9 C) (Oral)   Resp 16   LMP 03/17/2024 (Approximate)   SpO2 98%   Physical Exam Vitals and nursing note reviewed.  Constitutional:      General: She is not in acute distress.    Appearance: She is well-developed. She is not ill-appearing or toxic-appearing.  HENT:     Head: Normocephalic and atraumatic.     Mouth/Throat:     Mouth: Mucous membranes are moist.  Cardiovascular:     Rate and Rhythm: Normal rate.  Pulmonary:     Effort: Pulmonary effort is normal. No respiratory distress.   Musculoskeletal:     Lumbar back: Spasms and tenderness present. No swelling or bony tenderness. Decreased range of motion. Negative right straight leg raise test and negative left straight leg raise test.  Skin:    General: Skin is warm and dry.  Neurological:     General: No focal deficit present.     Mental Status: She is alert and oriented to person, place, and time.  Psychiatric:        Mood and Affect: Mood normal.        Behavior: Behavior normal.     Procedures  Results for orders placed or performed during the hospital encounter of 04/01/24 (from the past 24 hours)  POCT urine pregnancy     Status: None   Collection Time: 04/01/24  6:47 PM  Result Value Ref Range   Preg Test, Ur Negative Negative    Assessment and Plan :     Discharge Instructions       1. Screening for STD (sexually transmitted disease) (Primary) - Cervicovaginal swab collected in UC and sent to lab for further testing results should be available in 2 to 3 days. - POCT urine pregnancy completed in UC is negative  2. Strain of lumbar region, subsequent encounter - baclofen (LIORESAL) 10 MG tablet; Take 1 tablet (10 mg total) by mouth 3 (three) times daily.  Dispense: 30 each; Refill: 0 - diclofenac  (VOLTAREN ) 50 MG EC tablet; Take 1 tablet (50 mg total) by mouth 2 (two) times daily.  Dispense: 30 tablet; Refill: 1  -Continue to monitor symptoms for any change in severity if there is any escalation of current symptoms or development of new symptoms follow-up in ER for further evaluation and management.      Zaley Talley B Nasrin Lanzo   Nissim Fleischer, [place], [place] 04/01/24 [phone]

## 2024-04-01 NOTE — Discharge Instructions (Addendum)
  1. Screening for STD (sexually transmitted disease) (Primary) - Cervicovaginal swab collected in UC and sent to lab for further testing results should be available in 2 to 3 days. - POCT urine pregnancy completed in UC is negative  2. Strain of lumbar region, subsequent encounter - baclofen (LIORESAL) 10 MG tablet; Take 1 tablet (10 mg total) by mouth 3 (three) times daily.  Dispense: 30 each; Refill: 0 - diclofenac  (VOLTAREN ) 50 MG EC tablet; Take 1 tablet (50 mg total) by mouth 2 (two) times daily.  Dispense: 30 tablet; Refill: 1  -Continue to monitor symptoms for any change in severity if there is any escalation of current symptoms or development of new symptoms follow-up in ER for further evaluation and management.

## 2024-04-01 NOTE — ED Triage Notes (Signed)
 Patient here today with c/o left side LB pain that radiates down legs upon waking this morning. Patient states that she has a h/o back pain and had to call out of work today due to the pain. Patient has taken Ibuprofen  with some relief.   Patient would also like to be tested for Stds(swab only). Denies symptoms.

## 2024-04-03 LAB — CERVICOVAGINAL ANCILLARY ONLY
Bacterial Vaginitis (gardnerella): POSITIVE — AB
Candida Glabrata: NEGATIVE
Candida Vaginitis: NEGATIVE
Chlamydia: NEGATIVE
Comment: NEGATIVE
Comment: NEGATIVE
Comment: NEGATIVE
Comment: NEGATIVE
Comment: NEGATIVE
Comment: NORMAL
Neisseria Gonorrhea: NEGATIVE
Trichomonas: NEGATIVE

## 2024-04-04 ENCOUNTER — Ambulatory Visit (HOSPITAL_COMMUNITY): Payer: Self-pay

## 2024-04-04 MED ORDER — METRONIDAZOLE 0.75 % VA GEL: 1.0000 | Freq: Every day | VAGINAL | 0 refills | Status: AC

## 2024-04-10 ENCOUNTER — Encounter (HOSPITAL_COMMUNITY): Payer: Self-pay | Admitting: Emergency Medicine

## 2024-04-10 ENCOUNTER — Other Ambulatory Visit: Payer: Self-pay

## 2024-04-10 ENCOUNTER — Ambulatory Visit (HOSPITAL_COMMUNITY)
Admission: EM | Admit: 2024-04-10 | Discharge: 2024-04-10 | Disposition: A | Discharge status: Home / Self Care | Attending: Emergency Medicine | Admitting: Emergency Medicine

## 2024-04-10 DIAGNOSIS — Z3202 Encounter for pregnancy test, result negative: Secondary | ICD-10-CM

## 2024-04-10 DIAGNOSIS — R112 Nausea with vomiting, unspecified: Secondary | ICD-10-CM

## 2024-04-10 LAB — POCT URINE PREGNANCY: Preg Test, Ur: NEGATIVE

## 2024-04-10 MED ORDER — ONDANSETRON 4 MG PO TBDP: 4.0000 mg | ORAL_TABLET | Freq: Three times a day (TID) | ORAL | 0 refills | Status: DC | PRN

## 2024-04-10 NOTE — ED Provider Notes (Signed)
 [facility]    CSN: [id number] Arrival date & time: 04/10/24  0820      History   Chief Complaint Chief Complaint  Patient presents with   Possible Pregnancy    HPI Bethany Avery is a 23 y.o. female.   Patient presents to clinic for concern of possible pregnancy.  4 days ago she started to have nausea and vomiting with diarrhea.  Symptoms have gotten a little bit better but persisted.  Last emesis was early this morning, reports it was orange/red, did a chicken prior.  Daughter has been sick with similar symptoms.  Has not had fever.  Denies urinary frequency, urgency or dysuria.  Denies vaginal discharge.  Has had some lower abdominal discomfort, thinks this could be related to oncoming menstrual cycle.  Last menstrual cycle was October 18.  Has not tried medications or interventions prior to arrival.  The history is provided by the patient and medical records.  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 Fredia Fresh  INDICATION Growth U/S BPP weekly DELIVERY RECOMMENDATION (GA)  Diabetes   A1 - good control     A2 - good control     A2  - poor control or poor compliance    (Macrosomia or polyhydramnios)     A2/B and B-C Pregestational Type II DM    Poor control B-C or D-R-F-T  or  Type I DM     Gestational hypertension 07/03/2020   Gonorrhea 02/02/2018   Headache    Ovarian cyst     Patient Active Problem List   Diagnosis Date Noted   History of ectopic pregnancy 09/05/2022   Adjustment disorder with mixed anxiety and depressed mood 04/02/2020   Marijuana user  06/22/2018   Attention deficit hyperactivity disorder 08/24/2013    Past Surgical History:  Procedure Laterality Date   CESAREAN SECTION N/A 12/02/2020   Procedure: CESAREAN SECTION;  Surgeon: Lola Donnice HERO, MD;  Location: [facility];  Service: Obstetrics;  Laterality: N/A;   LAPAROTOMY Right 09/05/2022   Procedure: EXPLORATORY LAPAROTOMY WITH RESECTION OF CORNUAL ECTOPIC PREGNANCY;  Surgeon: Herchel Gloris LABOR, MD;  Location: [facility];  Service: Gynecology;  Laterality: Right;   UNILATERAL SALPINGECTOMY Right 09/05/2022   Procedure: UNILATERAL SALPINGECTOMY;  Surgeon: Herchel Gloris LABOR, MD;  Location: [facility];  Service: Gynecology;  Laterality: Right;    OB History     Gravida  3   Para  1   Term  1   Preterm      AB  2   Living  1      SAB  1   IAB      Ectopic  1   Multiple  0   Live Births  1            Home Medications    Prior to Admission medications   Medication Sig Start Date End Date Taking? Authorizing Provider  ondansetron  (ZOFRAN -ODT) 4 MG disintegrating tablet Take  1 tablet (4 mg total) by mouth every 8 (eight) hours as needed for nausea or vomiting. 04/10/24  Yes Para Cossey  N, FNP  albuterol  (VENTOLIN  HFA) 108 (90 Base) MCG/ACT inhaler Inhale 2 puffs into the lungs every 6 (six) hours as needed for wheezing. 01/11/24 01/10/25  [provider]  baclofen (LIORESAL) 10 MG tablet Take 1 tablet (10 mg total) by mouth 3 (three) times daily. 04/01/24   Reddick, Johnathan B, NP  diclofenac  (VOLTAREN ) 50 MG EC tablet Take 1 tablet (50 mg total) by mouth 2 (two) times daily. 04/01/24   Reddick, Johnathan B, NP  WEGOVY 0.5 MG/0.5ML SOAJ SQ injection Inject 0.5 mg into the skin once a week. 02/04/24   [provider]  metoCLOPramide  (REGLAN ) 10 MG tablet Take 1 tablet (10 mg total) by mouth every 8 (eight) hours as needed (take with tylenol  for headaches). 07/03/20 12/01/20  Jerilynn Longs, NP    Family History Family History  Problem  Relation Age of Onset   Hypertension Mother    Healthy Father     Social History Social History   Tobacco Use   Smoking status: Never   Smokeless tobacco: Never  Vaping Use   Vaping status: Never Used  Substance Use Topics   Alcohol use: Yes   Drug use: Yes    Frequency: 14.0 times per week    Types: Marijuana    Comment: daily     Allergies   Nickel   Review of Systems Review of Systems  Per HPI  Physical Exam Triage Vital Signs ED Triage Vitals  Encounter Vitals Group     BP 04/10/24 0900 123/76     Girls Systolic BP Percentile --      Girls Diastolic BP Percentile --      Boys Systolic BP Percentile --      Boys Diastolic BP Percentile --      Pulse Rate 04/10/24 0900 95     Resp 04/10/24 0900 18     Temp 04/10/24 0900 98.6 F (37 C)     Temp Source 04/10/24 0900 Oral     SpO2 04/10/24 0900 97 %     Weight --      Height --      Head Circumference --      Peak Flow --      Pain Score 04/10/24 0901 0     Pain Loc --      Pain Education --      Exclude from Growth Chart --    No data found.  Updated Vital Signs BP 123/76 (BP Location: Left Arm)   Pulse 95   Temp 98.6 F (37 C) (Oral)   Resp 18   LMP 03/11/2024 (Approximate)   SpO2 97%   Visual Acuity Right Eye Distance:   Left Eye Distance:   Bilateral Distance:    Right Eye Near:   Left Eye Near:    Bilateral Near:     Physical Exam Vitals and nursing note reviewed.  Constitutional:      Appearance: Normal appearance.  HENT:     Head: Normocephalic and atraumatic.     Right Ear: External ear normal.     Left Ear: External ear normal.     Nose: Nose normal.     Mouth/Throat:     Mouth: Mucous membranes are moist.  Eyes:     Conjunctiva/sclera: Conjunctivae normal.  Cardiovascular:     Rate and Rhythm: Normal rate.  Pulmonary:  Effort: Pulmonary effort is normal. No respiratory distress.  Abdominal:     General: Abdomen is flat. Bowel sounds are normal.     Palpations:  Abdomen is soft.     Tenderness: There is abdominal tenderness. There is no guarding.  Skin:    General: Skin is warm and dry.  Neurological:     General: No focal deficit present.     Mental Status: She is alert and oriented to person, place, and time.  Psychiatric:        Mood and Affect: Mood normal.        Behavior: Behavior normal.      UC Treatments / Results  Labs (all labs ordered are listed, but only abnormal results are displayed) Labs Reviewed  POCT URINE PREGNANCY - Normal    EKG   Radiology No results found.  Procedures Procedures (including critical care time)  Medications Ordered in UC Medications - No data to display  Initial Impression / Assessment and Plan / UC Course  I have reviewed the triage vital signs and the nursing notes.  Pertinent labs & imaging results that were available during my care of the patient were reviewed by me and considered in my medical decision making (see chart for details).  Vitals and triage reviewed, patient is hemodynamically stable.  Abdomen is soft with mild lower abdominal tenderness, without rebound or guarding.  Low concern for acute abdomen.  Urine pregnancy test negative.  Suspect viral gastritis with nausea, vomiting and diarrhea.  Symptomatic management discussed.  Plan of care, follow-up care return precautions given, no questions at this time.    Final Clinical Impressions(s) / UC Diagnoses   Final diagnoses:  Negative pregnancy test  Nausea and vomiting, unspecified vomiting type     Discharge Instructions      Use the nausea medicine every 8 hours as needed.  Ensure you are staying well-hydrated with at least 64 ounces of water  daily.  You can add Gatorade, Pedialyte or broth for electrolytes.  If you are able to hold down liquids you can progress to solids such as bananas, rice, toast, applesauce and boiled chicken.  Avoid fried, spicy or heavily processed foods as this can further irritate the GI  tract.  Symptoms should continue to improve over the next few days.  If no improvement or any changes return to clinic for reevaluation.     ED Prescriptions     Medication Sig Dispense Auth. Provider   ondansetron  (ZOFRAN -ODT) 4 MG disintegrating tablet Take 1 tablet (4 mg total) by mouth every 8 (eight) hours as needed for nausea or vomiting. 20 tablet Dreama, Keldric Poyer  N, FNP      PDMP not reviewed this encounter.   Dreama, [place] Viere[place]  N, FNP 04/10/24 1007

## 2024-04-10 NOTE — ED Triage Notes (Signed)
 Pt here requesting pregnancy test; pt sts LMP was 03/11/24; pt sts some nausea and possible positive home pregnancy test

## 2024-04-10 NOTE — Discharge Instructions (Addendum)
 Use the nausea medicine every 8 hours as needed.  Ensure you are staying well-hydrated with at least 64 ounces of water  daily.  You can add Gatorade, Pedialyte or broth for electrolytes.  If you are able to hold down liquids you can progress to solids such as bananas, rice, toast, applesauce and boiled chicken.  Avoid fried, spicy or heavily processed foods as this can further irritate the GI tract.  Symptoms should continue to improve over the next few days.  If no improvement or any changes return to clinic for reevaluation.

## 2024-05-03 ENCOUNTER — Ambulatory Visit (HOSPITAL_COMMUNITY)
Admission: EM | Admit: 2024-05-03 | Discharge: 2024-05-03 | Disposition: A | Discharge status: Home / Self Care | Attending: Nurse Practitioner | Admitting: Nurse Practitioner

## 2024-05-03 ENCOUNTER — Encounter (HOSPITAL_COMMUNITY): Payer: Self-pay

## 2024-05-03 DIAGNOSIS — J029 Acute pharyngitis, unspecified: Secondary | ICD-10-CM | POA: Insufficient documentation

## 2024-05-03 DIAGNOSIS — Z9189 Other specified personal risk factors, not elsewhere classified: Secondary | ICD-10-CM | POA: Diagnosis present

## 2024-05-03 DIAGNOSIS — Z3202 Encounter for pregnancy test, result negative: Secondary | ICD-10-CM | POA: Insufficient documentation

## 2024-05-03 DIAGNOSIS — N898 Other specified noninflammatory disorders of vagina: Secondary | ICD-10-CM | POA: Insufficient documentation

## 2024-05-03 LAB — POCT URINALYSIS DIP (MANUAL ENTRY)
Bilirubin, UA: NEGATIVE
Glucose, UA: NEGATIVE mg/dL
Ketones, POC UA: NEGATIVE mg/dL
Leukocytes, UA: NEGATIVE
Nitrite, UA: NEGATIVE
Protein Ur, POC: NEGATIVE mg/dL
Spec Grav, UA: 1.025 (ref 1.010–1.025)
Urobilinogen, UA: 1 U/dL
pH, UA: 6.5 (ref 5.0–8.0)

## 2024-05-03 LAB — POCT URINE PREGNANCY: Preg Test, Ur: NEGATIVE

## 2024-05-03 LAB — HIV ANTIBODY (ROUTINE TESTING W REFLEX): HIV Screen 4th Generation wRfx: NONREACTIVE

## 2024-05-03 LAB — POCT RAPID STREP A (OFFICE): Rapid Strep A Screen: NEGATIVE

## 2024-05-03 NOTE — Discharge Instructions (Signed)
 Strep test in the clinic is negative, staff will call you if the throat culture is positive for bacteria in the next 2-3 days and call in treatment if necessary based on result.   For now we will treat this as a viral illness with supportive care.   You may take ibuprofen  600mg  (3 over the counter 200mg  pills) every 6 hours as needed for pain and inflammation.  You may take tylenol  1,000mg  (2 over the counter extra strength 500mg  pills) every 6 hours as needed for pain.   - 1 tablespoon of honey in warm water  and/or salt water  gargles every 3-4 hours  Seek medical care if you develop changes to your voice, inability to swallow, drooling, or any new symptoms. If symptoms are severe, please go to the ER. I hope you feel better!    STD testing pending, this will take 2-3 days to result. We will only call you if your testing is positive for any infection(s) and we will provide treatment.  Avoid sexual intercourse until your STD results come back.  If any of your STD results are positive, you will need to avoid sexual intercourse for 7 days while you are being treated to prevent spread of STD.  Condom use is the best way to prevent spread of STDs. Notify partner(s) of any positive results.  Return to urgent care as needed.

## 2024-05-03 NOTE — ED Triage Notes (Signed)
 Patient c/o sore throat x 4 days. Patient is also requesting STD testing and states she has had lower abdominal pain x 4 days. Patient denies vaginal discharge.  Patient denies taking any medications for her symptoms.

## 2024-05-03 NOTE — ED Provider Notes (Signed)
 [facility]    CSN: [id number] Arrival date & time: 05/03/24  1520      History   Chief Complaint Chief Complaint  Patient presents with   Sore Throat    HPI Bethany Avery is a 23 y.o. female.   Bethany Avery is a 23 y.o. female presenting for chief complaint of Sore Throat, nasal congestion, and generalized fatigue that started 4 days ago.  Sore throat is worsened by swallowing.  Sore throat is 5/10.  She denies difficulty swallowing, difficulty maintaining secretions, fever, chills, nausea, vomiting, abdominal pain, diarrhea, rash, and shortness of breath.  Her son is sick with similar symptoms.  Her son goes to daycare where he brings home a lot of viral illnesses.  Patient additionally reports vaginal itching and suprapubic pelvic discomfort starting a few days ago.  She is sexually active with 1 female partner unprotected.  No known exposures to STDs.  She would like to be tested for STDs today.  She denies vaginal discharge and vaginal odor.  No flank pain, low back pain, or vaginal rash.  No recent antibiotic or steroid use.  She denies urinary symptoms.  Last menstrual cycle started on April 13, 2024, she is unsure of pregnancy status.  She has not attempted use of any over-the-counter medications to help with symptoms prior to arrival.   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 Fredia Fresh  INDICATION Growth U/S BPP weekly DELIVERY RECOMMENDATION ([place])  Diabetes   A1 - good control     A2 - good control     A2  - poor control or poor compliance    (Macrosomia or  polyhydramnios)     A2/B and B-C Pregestational Type II DM    Poor control B-C or D-R-F-T  or  Type I DM     Gestational hypertension 07/03/2020   Gonorrhea 02/02/2018   Headache    Ovarian cyst     Patient Active Problem List   Diagnosis Date Noted   History of ectopic pregnancy 09/05/2022   Adjustment disorder with mixed anxiety and depressed mood 04/02/2020   Marijuana user 06/22/2018   Attention deficit hyperactivity disorder 08/24/2013    Past Surgical History:  Procedure Laterality Date   CESAREAN SECTION N/A 12/02/2020   Procedure: CESAREAN SECTION;  Surgeon: Lola Donnice HERO, MD;  [facility];  Service: Obstetrics;  Laterality: N/A;   LAPAROTOMY Right 09/05/2022   Procedure: EXPLORATORY LAPAROTOMY WITH RESECTION OF CORNUAL ECTOPIC PREGNANCY;  Surgeon: Herchel Gloris LABOR, MD;  Location: [facility];  Service: Gynecology;  Laterality: Right;   UNILATERAL SALPINGECTOMY Right 09/05/2022   Procedure: UNILATERAL SALPINGECTOMY;  Surgeon: Herchel Gloris LABOR, MD;  [facility]: [facility];  Service: Gynecology;  Laterality: Right;    OB History     Gravida  3   Para  1   Term  1   Preterm      AB  2   Living  1  SAB  1   IAB      Ectopic  1   Multiple  0   Live Births  1            Home Medications    Prior to Admission medications   Medication Sig Start Date End Date Taking? Authorizing Provider  albuterol  (VENTOLIN  HFA) 108 (90 Base) MCG/ACT inhaler Inhale 2 puffs into the lungs every 6 (six) hours as needed for wheezing. 01/11/24 01/10/25  [provider]  baclofen  (LIORESAL ) 10 MG tablet Take 1 tablet (10 mg total) by mouth 3 (three) times daily. 04/01/24   Reddick, Johnathan B, NP  diclofenac  (VOLTAREN ) 50 MG EC tablet Take 1 tablet (50 mg total) by mouth 2 (two) times daily. 04/01/24   Reddick, Johnathan B, NP  ondansetron  (ZOFRAN -ODT) 4 MG disintegrating tablet Take 1 tablet (4 mg total) by mouth every 8 (eight) hours as needed for nausea or  vomiting. 04/10/24   Dreama, [place]  N, FNP  WEGOVY 0.5 MG/0.5ML SOAJ SQ injection Inject 0.5 mg into the skin once a week. Patient not taking: Reported on 05/03/2024 02/04/24   [provider]  metoCLOPramide  (REGLAN ) 10 MG tablet Take 1 tablet (10 mg total) by mouth every 8 (eight) hours as needed (take with tylenol  for headaches). 07/03/20 12/01/20  Jerilynn Longs, NP    Family History Family History  Problem Relation Age of Onset   Hypertension Mother    Healthy Father     Social History Social History   Tobacco Use   Smoking status: Never   Smokeless tobacco: Never  Vaping Use   Vaping status: Never Used  Substance Use Topics   Alcohol use: Yes   Drug use: Yes    Frequency: 14.0 times per week    Types: Marijuana    Comment: daily     Allergies   Nickel   Review of Systems Review of Systems Per HPI  Physical Exam Triage Vital Signs ED Triage Vitals [05/03/24 1646]  Encounter Vitals Group     BP 110/77     Girls Systolic BP Percentile      Girls Diastolic BP Percentile      Boys Systolic BP Percentile      Boys Diastolic BP Percentile      Pulse Rate 88     Resp 16     Temp 98.5 F (36.9 C)     Temp Source Oral     SpO2 96 %     Weight      Height      Head Circumference      Peak Flow      Pain Score 5     Pain Loc      Pain Education      Exclude from Growth Chart    No data found.  Updated Vital Signs BP 110/77 (BP Location: Right Arm)   Pulse 88   Temp 98.5 F (36.9 C) (Oral)   Resp 16   LMP 04/13/2024 (Approximate)   SpO2 96%   Visual Acuity Right Eye Distance:   Left Eye Distance:   Bilateral Distance:    Right Eye Near:   Left Eye Near:    Bilateral Near:     Physical Exam Vitals and nursing note reviewed.  Constitutional:      Appearance: She is not ill-appearing or toxic-appearing.  HENT:     Head: Normocephalic and atraumatic.     Right Ear: Hearing, tympanic membrane, ear canal and external  ear normal.      Left Ear: Hearing, tympanic membrane, ear canal and external ear normal.     Nose: Nose normal.     Mouth/Throat:     Lips: Pink.     Mouth: Mucous membranes are moist. No injury or oral lesions.     Dentition: Normal dentition.     Tongue: No lesions.     Pharynx: Oropharynx is clear. Uvula midline. Posterior oropharyngeal erythema present. No pharyngeal swelling, oropharyngeal exudate, uvula swelling or postnasal drip.     Tonsils: No tonsillar exudate. 1+ on the right. 1+ on the left.     Comments: Mild erythema to posterior oropharynx with small amount of clear postnasal drainage visualized. No trismus, phonation normal, maintaining secretions without difficulty.  Eyes:     General: Lids are normal. Vision grossly intact. Gaze aligned appropriately.     Extraocular Movements: Extraocular movements intact.     Conjunctiva/sclera: Conjunctivae normal.  Neck:     Trachea: Trachea and phonation normal.  Cardiovascular:     Rate and Rhythm: Normal rate and regular rhythm.     Heart sounds: Normal heart sounds, S1 normal and S2 normal.  Pulmonary:     Effort: Pulmonary effort is normal. No respiratory distress.     Breath sounds: Normal breath sounds and air entry. No wheezing, rhonchi or rales.  Chest:     Chest wall: No tenderness.  Musculoskeletal:     Cervical back: Neck supple.  Lymphadenopathy:     Cervical: No cervical adenopathy.  Skin:    General: Skin is warm and dry.     Capillary Refill: Capillary refill takes less than 2 seconds.     Findings: No rash.  Neurological:     General: No focal deficit present.     Mental Status: She is alert and oriented to person, place, and time. Mental status is at baseline.     Cranial Nerves: No dysarthria or facial asymmetry.  Psychiatric:        Mood and Affect: Mood normal.        Speech: Speech normal.        Behavior: Behavior normal.        Thought Content: Thought content normal.        Judgment: Judgment normal.       UC Treatments / Results  Labs (all labs ordered are listed, but only abnormal results are displayed) Labs Reviewed  POCT URINALYSIS DIP (MANUAL ENTRY) - Abnormal; Notable for the following components:      Result Value   Clarity, UA hazy (*)    Blood, UA trace-lysed (*)    All other components within normal limits  SYPHILIS: RPR W/REFLEX TO RPR TITER AND TREPONEMAL ANTIBODIES, TRADITIONAL SCREENING AND DIAGNOSIS ALGORITHM  HIV ANTIBODY (ROUTINE TESTING W REFLEX)  POCT URINE PREGNANCY  POCT RAPID STREP A (OFFICE)  CERVICOVAGINAL ANCILLARY ONLY    EKG   Radiology No results found.  Procedures Procedures (including critical care time)  Medications Ordered in UC Medications - No data to display  Initial Impression / Assessment and Plan / UC Course  I have reviewed the triage vital signs and the nursing notes.  Pertinent labs & imaging results that were available during my care of the patient were reviewed by me and considered in my medical decision making (see chart for details).   1.  Sore throat, viral pharyngitis Evaluation suggests viral pharyngitis etiology.   Group A strep POC testing negative, throat culture pending, will  treat based on throat culture results for bacterial pharyngitis if indicated.  Low suspicion for mononucleosis, epiglottitis, peritonsillar abscess, etc. HEENT exam stable without red flag signs. Will manage this conservatively with supportive care as outlined in AVS.  Salt water  gargles as needed, warm water  with honey.    2. Unprotected sex, vaginal itching, negative pregnancy test STI labs pending, will notify patient of positive results and treat accordingly per protocol when labs result.  Patient would like HIV and syphilis testing today.   Patient to avoid sexual intercourse until screening testing comes back.   Education provided regarding safe sexual practices and patient encouraged to use protection to prevent spread of STIs.     Urine pregnancy test is negative. Urinalysis ordered by nursing staff is unremarkable.   Counseled patient on potential for adverse effects with medications prescribed/recommended today, strict ER and return-to-clinic precautions discussed, patient verbalized understanding.    Final Clinical Impressions(s) / UC Diagnoses   Final diagnoses:  Sore throat  At risk for sexually transmitted disease due to unprotected sex  Vaginal itching     Discharge Instructions      Strep test in the clinic is negative, staff will call you if the throat culture is positive for bacteria in the next 2-3 days and call in treatment if necessary based on result.   For now we will treat this as a viral illness with supportive care.   You may take ibuprofen  600mg  (3 over the counter 200mg  pills) every 6 hours as needed for pain and inflammation.  You may take tylenol  1,000mg  (2 over the counter extra strength 500mg  pills) every 6 hours as needed for pain.   - 1 tablespoon of honey in warm water  and/or salt water  gargles every 3-4 hours  Seek medical care if you develop changes to your voice, inability to swallow, drooling, or any new symptoms. If symptoms are severe, please go to the ER. I hope you feel better!    STD testing pending, this will take 2-3 days to result. We will only call you if your testing is positive for any infection(s) and we will provide treatment.  Avoid sexual intercourse until your STD results come back.  If any of your STD results are positive, you will need to avoid sexual intercourse for 7 days while you are being treated to prevent spread of STD.  Condom use is the best way to prevent spread of STDs. Notify partner(s) of any positive results.  Return to urgent care as needed.       ED Prescriptions   None    PDMP not reviewed this encounter.   Enedelia Dorna HERO, [place] 05/03/24 1749

## 2024-05-04 ENCOUNTER — Ambulatory Visit (HOSPITAL_COMMUNITY): Payer: Self-pay

## 2024-05-04 LAB — CERVICOVAGINAL ANCILLARY ONLY
Bacterial Vaginitis (gardnerella): POSITIVE — AB
Candida Glabrata: NEGATIVE
Candida Vaginitis: POSITIVE — AB
Chlamydia: NEGATIVE
Comment: NEGATIVE
Comment: NEGATIVE
Comment: NEGATIVE
Comment: NEGATIVE
Comment: NEGATIVE
Comment: NORMAL
Neisseria Gonorrhea: NEGATIVE
Trichomonas: NEGATIVE

## 2024-05-04 LAB — SYPHILIS: RPR W/REFLEX TO RPR TITER AND TREPONEMAL ANTIBODIES, TRADITIONAL SCREENING AND DIAGNOSIS ALGORITHM: RPR Ser Ql: NONREACTIVE

## 2024-05-04 MED ORDER — METRONIDAZOLE 0.75 % VA GEL: 1.0000 | Freq: Every day | VAGINAL | 0 refills | Status: AC

## 2024-05-04 MED ORDER — METRONIDAZOLE 500 MG PO TABS: 500.0000 mg | ORAL_TABLET | Freq: Two times a day (BID) | ORAL | 0 refills | Status: AC

## 2024-05-04 MED ORDER — FLUCONAZOLE 150 MG PO TABS: 150.0000 mg | ORAL_TABLET | Freq: Once | ORAL | 0 refills | Status: AC

## 2024-05-09 ENCOUNTER — Encounter (HOSPITAL_COMMUNITY): Payer: Self-pay | Admitting: *Deleted

## 2024-05-09 ENCOUNTER — Ambulatory Visit (HOSPITAL_COMMUNITY)
Admission: EM | Admit: 2024-05-09 | Discharge: 2024-05-09 | Disposition: A | Discharge status: Home / Self Care | Attending: Internal Medicine | Admitting: Internal Medicine

## 2024-05-09 DIAGNOSIS — Z3201 Encounter for pregnancy test, result positive: Secondary | ICD-10-CM | POA: Diagnosis not present

## 2024-05-09 LAB — POCT URINE DIPSTICK
Bilirubin, UA: NEGATIVE
Glucose, UA: NEGATIVE mg/dL
Ketones, POC UA: NEGATIVE mg/dL
Leukocytes, UA: NEGATIVE
Nitrite, UA: NEGATIVE
POC PROTEIN,UA: NEGATIVE
Spec Grav, UA: 1.02 (ref 1.010–1.025)
Urobilinogen, UA: 0.2 U/dL
pH, UA: 7 (ref 5.0–8.0)

## 2024-05-09 LAB — POCT URINE PREGNANCY: Preg Test, Ur: POSITIVE — AB

## 2024-05-09 MED ORDER — PRENATAL 27-0.8 MG PO TABS: 1.0000 | ORAL_TABLET | Freq: Every day | ORAL | 0 refills | Status: AC

## 2024-05-09 NOTE — ED Triage Notes (Addendum)
 Pt reports positive home pregnancy test; states has not missed her menstrual period yet. Wishes to confirm pregnancy. States had some slight abd cramping approx 2 hrs ago which has resolved.

## 2024-05-09 NOTE — ED Provider Notes (Signed)
 [facility]    CSN: [id number] Arrival date & time: 05/09/24  0932      History   Chief Complaint Chief Complaint  Patient presents with   Possible Pregnancy    HPI Bethany Avery is a 23 y.o. female.   Bethany Avery is a 23 y.o. female presenting for chief complaint of possible pregnancy.  Patient took a home pregnancy test yesterday and it was positive.  She is here for pregnancy confirmation.  Last menstrual cycle started on April 13, 2024.  She had a little bit of abdominal cramping on the left lower abdomen this morning which has completely resolved at this time without intervention.  History of ectopic pregnancy in October 2025, she had a right salpingectomy.  She has been recovering well from recent ectopic pregnancy.  Denies low back pain, urinary symptoms, dizziness, current abdominal pain, pelvic pain, and vaginal symptoms.  She has felt nauseous and has had a few episodes of nonbloody/nonbilious emesis over the last week or so.  Tolerating foods and fluids well without vomiting currently.   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 Fredia Fresh  INDICATION Growth U/S BPP weekly DELIVERY RECOMMENDATION ([place])  Diabetes   A1 - good control     A2 - good control     A2  - poor control or poor compliance    (Macrosomia or polyhydramnios)     A2/B and B-C Pregestational Type II DM    Poor control B-C or D-R-F-T  or  Type I DM     Gestational hypertension 07/03/2020   Gonorrhea 02/02/2018   Headache    Ovarian cyst     Patient Active Problem List    Diagnosis Date Noted   History of ectopic pregnancy 09/05/2022   Adjustment disorder with mixed anxiety and depressed mood 04/02/2020   Marijuana user 06/22/2018   Attention deficit hyperactivity disorder 08/24/2013    Past Surgical History:  Procedure Laterality Date   CESAREAN SECTION N/A 12/02/2020   Procedure: CESAREAN SECTION;  Surgeon: Lola Donnice HERO, MD;  [facility];  Service: Obstetrics;  Laterality: N/A;   LAPAROTOMY Right 09/05/2022   Procedure: EXPLORATORY LAPAROTOMY WITH RESECTION OF CORNUAL ECTOPIC PREGNANCY;  Surgeon: Herchel Gloris LABOR, MD;  Location: [facility];  Service: Gynecology;  Laterality: Right;   UNILATERAL SALPINGECTOMY Right 09/05/2022   Procedure: UNILATERAL SALPINGECTOMY;  Surgeon: Herchel Gloris LABOR, MD;  Location: [facility];  Service: Gynecology;  Laterality: Right;    OB History     Gravida  4   Para  1   Term  1   Preterm      AB  2   Living  1      SAB  1   IAB      Ectopic  1   Multiple  0   Live Births  1            Home Medications    Prior  to Admission medications  Medication Sig Start Date End Date Taking? Authorizing Provider  Prenatal Vit-Fe Fumarate-FA (MULTIVITAMIN-PRENATAL) 27-0.8 MG TABS tablet Take 1 tablet by mouth daily at 12 noon. 05/09/24  Yes Enedelia Dorna HERO, FNP  albuterol  (VENTOLIN  HFA) 108 (90 Base) MCG/ACT inhaler Inhale 2 puffs into the lungs every 6 (six) hours as needed for wheezing. 01/11/24 01/10/25  [provider]  baclofen  (LIORESAL ) 10 MG tablet Take 1 tablet (10 mg total) by mouth 3 (three) times daily. 04/01/24   Reddick, Johnathan B, NP  diclofenac  (VOLTAREN ) 50 MG EC tablet Take 1 tablet (50 mg total) by mouth 2 (two) times daily. 04/01/24   Reddick, Johnathan B, NP  metroNIDAZOLE  (FLAGYL ) 500 MG tablet Take 1 tablet (500 mg total) by mouth 2 (two) times daily for 7 days. 05/04/24 05/11/24  Vonna Sharlet POUR, MD  metroNIDAZOLE  (METROGEL ) 0.75 % vaginal gel Place 1 Applicatorful  vaginally at bedtime for 5 days. 05/04/24 05/09/24  Vonna Sharlet POUR, MD  ondansetron  (ZOFRAN -ODT) 4 MG disintegrating tablet Take 1 tablet (4 mg total) by mouth every 8 (eight) hours as needed for nausea or vomiting. 04/10/24   Dreama, [place]  N, FNP  WEGOVY 0.5 MG/0.5ML SOAJ SQ injection Inject 0.5 mg into the skin once a week. Patient not taking: Reported on 05/03/2024 02/04/24   [provider]  metoCLOPramide  (REGLAN ) 10 MG tablet Take 1 tablet (10 mg total) by mouth every 8 (eight) hours as needed (take with tylenol  for headaches). 07/03/20 12/01/20  Jerilynn Longs, NP    Family History Family History  Problem Relation Age of Onset   Hypertension Mother    Healthy Father     Social History Social History[1]   Allergies   Nickel   Review of Systems Review of Systems Per HPI  Physical Exam Triage Vital Signs ED Triage Vitals  Encounter Vitals Group     BP 05/09/24 0943 109/76     Girls Systolic BP Percentile --      Girls Diastolic BP Percentile --      Boys Systolic BP Percentile --      Boys Diastolic BP Percentile --      Pulse Rate 05/09/24 0943 93     Resp 05/09/24 0943 16     Temp 05/09/24 0943 98.3 F (36.8 C)     Temp Source 05/09/24 0943 Oral     SpO2 05/09/24 0943 97 %     Weight --      Height --      Head Circumference --      Peak Flow --      Pain Score 05/09/24 0944 0     Pain Loc --      Pain Education --      Exclude from Growth Chart --    No data found.  Updated Vital Signs BP 109/76   Pulse 93   Temp 98.3 F (36.8 C) (Oral)   Resp 16   LMP 04/13/2024 (Exact Date)   SpO2 97%   Visual Acuity Right Eye Distance:   Left Eye Distance:   Bilateral Distance:    Right Eye Near:   Left Eye Near:    Bilateral Near:     Physical Exam Vitals and nursing note reviewed.  Constitutional:      Appearance: She is not ill-appearing or toxic-appearing.  HENT:     Head: Normocephalic and atraumatic.     Right Ear: Hearing and  external ear normal.     Left  Ear: Hearing and external ear normal.     Nose: Nose normal.     Mouth/Throat:     Lips: Pink.  Eyes:     General: Lids are normal. Vision grossly intact. Gaze aligned appropriately.     Extraocular Movements: Extraocular movements intact.     Conjunctiva/sclera: Conjunctivae normal.  Cardiovascular:     Rate and Rhythm: Normal rate and regular rhythm.     Heart sounds: Normal heart sounds, S1 normal and S2 normal.  Pulmonary:     Effort: Pulmonary effort is normal. No respiratory distress.     Breath sounds: Normal breath sounds and air entry.  Abdominal:     General: Bowel sounds are normal.     Palpations: Abdomen is soft.     Tenderness: There is no abdominal tenderness. There is no right CVA tenderness, left CVA tenderness or guarding.  Musculoskeletal:     Cervical back: Neck supple.  Skin:    General: Skin is warm and dry.     Capillary Refill: Capillary refill takes less than 2 seconds.     Findings: No rash.  Neurological:     General: No focal deficit present.     Mental Status: She is alert and oriented to person, place, and time. Mental status is at baseline.     Cranial Nerves: No dysarthria or facial asymmetry.  Psychiatric:        Mood and Affect: Mood normal.        Speech: Speech normal.        Behavior: Behavior normal.        Thought Content: Thought content normal.        Judgment: Judgment normal.      UC Treatments / Results  Labs (all labs ordered are listed, but only abnormal results are displayed) Labs Reviewed  POCT URINE PREGNANCY - Abnormal; Notable for the following components:      Result Value   Preg Test, Ur Positive (*)    All other components within normal limits  POCT URINE DIPSTICK    EKG   Radiology No results found.  Procedures Procedures (including critical care time)  Medications Ordered in UC Medications - No data to display  Initial Impression / Assessment and Plan / UC Course  I  have reviewed the triage vital signs and the nursing notes.  Pertinent labs & imaging results that were available during my care of the patient were reviewed by me and considered in my medical decision making (see chart for details).   1.  Positive pregnancy test Urine pregnancy test is positive in the clinic.   Prenatal vitamin daily, stop ibuprofen , may take tylenol  for pain instead.  Given list of safe OTC medications in pregnancy.  OB/GYN provider list given, advised to schedule appointment for prenatal care MAU precautions given, no current red flag signs of pregnancy related emergency.  Urinalysis is unremarkable for signs of UTI in pregnancy.  Counseled patient on potential for adverse effects with medications prescribed/recommended today, strict ER and return-to-clinic precautions discussed, patient verbalized understanding.    Final Clinical Impressions(s) / UC Diagnoses   Final diagnoses:  Positive pregnancy test     Discharge Instructions      Your pregnancy test is positive.  - Refer to the list of safe medications in pregnancy you were given at urgent care today and do not take any other medications over-the-counter other than what is on this list.  You may only take Tylenol  for pain and  discomfort/fever.  Do not take ibuprofen  in pregnancy.  - Drink plenty of water  (at least 8 cups/day) to stay well-hydrated.  - Purchase a prenatal vitamin over the counter and take this once daily.  - Avoid smoking cigarettes, marijuana, and other drug use. Do not drink alcohol while pregnant.  If you develop any severe abdominal pain, severe low back pain, vaginal bleeding, or any other pregnancy related emergency, please go to the maternity assessment unit located at entrance C of [facility].  Call the OB/GYN listed below to schedule an appointment to establish care so that you can be monitored throughout your pregnancy.   [facility]            [address], [place], [place] [address] [phone]   [facility]                                                            [address], [place], [place], [address] [phone]      ED Prescriptions     Medication Sig Dispense Auth. Provider   Prenatal Vit-Fe Fumarate-FA (MULTIVITAMIN-PRENATAL) 27-0.8 MG TABS tablet Take 1 tablet by mouth daily at 12 noon. 30 tablet Enedelia Dorna HERO, FNP      PDMP not reviewed this encounter.     [1]  Social History Tobacco Use   Smoking status: Never   Smokeless tobacco: Never  Vaping Use   Vaping status: Never Used  Substance Use Topics   Alcohol use: Yes   Drug use: Yes    Types: Marijuana     Enedelia Dorna HERO, FNP 05/09/24 1055

## 2024-05-09 NOTE — Discharge Instructions (Signed)
Your pregnancy test is positive.  - Refer to the list of safe medications in pregnancy you were given at urgent care today and do not take any other medications over-the-counter other than what is on this list.  You may only take Tylenol for pain and discomfort/fever.  Do not take ibuprofen in pregnancy.  - Drink plenty of water (at least 8 cups/day) to stay well-hydrated.  - Purchase a prenatal vitamin over the counter and take this once daily.  - Avoid smoking cigarettes, marijuana, and other drug use. Do not drink alcohol while pregnant.  If you develop any severe abdominal pain, severe low back pain, vaginal bleeding, or any other pregnancy related emergency, please go to the maternity assessment unit located at entrance C of [facility].  Call the OB/GYN listed below to schedule an appointment to establish care so that you can be monitored throughout your pregnancy.   [facility]            [address], [place], [place] [address] [phone]   [facility] for [facility] at [facility]                                                            [address], [address], [place], [place], [address] [phone]

## 2024-05-12 ENCOUNTER — Inpatient Hospital Stay (HOSPITAL_COMMUNITY)

## 2024-05-12 ENCOUNTER — Other Ambulatory Visit: Payer: Self-pay

## 2024-05-12 ENCOUNTER — Encounter (HOSPITAL_COMMUNITY): Payer: Self-pay | Admitting: *Deleted

## 2024-05-12 ENCOUNTER — Inpatient Hospital Stay (HOSPITAL_COMMUNITY)
Admission: AD | Admit: 2024-05-12 | Discharge: 2024-05-12 | Disposition: A | Discharge status: Home / Self Care | Attending: Obstetrics and Gynecology | Admitting: Obstetrics and Gynecology

## 2024-05-12 DIAGNOSIS — Z3A01 Less than 8 weeks gestation of pregnancy: Secondary | ICD-10-CM | POA: Diagnosis not present

## 2024-05-12 DIAGNOSIS — R1032 Left lower quadrant pain: Secondary | ICD-10-CM | POA: Diagnosis present

## 2024-05-12 DIAGNOSIS — O3680X0 Pregnancy with inconclusive fetal viability, not applicable or unspecified: Secondary | ICD-10-CM | POA: Diagnosis not present

## 2024-05-12 DIAGNOSIS — R109 Unspecified abdominal pain: Secondary | ICD-10-CM

## 2024-05-12 LAB — CBC
HCT: 38.1 % (ref 36.0–46.0)
Hemoglobin: 13 g/dL (ref 12.0–15.0)
MCH: 31.3 pg (ref 26.0–34.0)
MCHC: 34.1 g/dL (ref 30.0–36.0)
MCV: 91.6 fL (ref 80.0–100.0)
Platelets: 430 K/uL — ABNORMAL HIGH (ref 150–400)
RBC: 4.16 MIL/uL (ref 3.87–5.11)
RDW: 12.4 % (ref 11.5–15.5)
WBC: 9.2 K/uL (ref 4.0–10.5)
nRBC: 0 % (ref 0.0–0.2)

## 2024-05-12 LAB — URINALYSIS, ROUTINE W REFLEX MICROSCOPIC
Bilirubin Urine: NEGATIVE
Glucose, UA: NEGATIVE mg/dL
Hgb urine dipstick: NEGATIVE
Ketones, ur: NEGATIVE mg/dL
Leukocytes,Ua: NEGATIVE
Nitrite: NEGATIVE
Protein, ur: NEGATIVE mg/dL
Specific Gravity, Urine: >1.03 — ABNORMAL HIGH (ref 1.005–1.030)
pH: 6 (ref 5.0–8.0)

## 2024-05-12 LAB — WET PREP, GENITAL
Clue Cells Wet Prep HPF POC: NONE SEEN
Sperm: NONE SEEN
Trich, Wet Prep: NONE SEEN
WBC, Wet Prep HPF POC: <10
Yeast Wet Prep HPF POC: NONE SEEN

## 2024-05-12 LAB — HCG, QUANTITATIVE, PREGNANCY: hCG, Beta Chain, Quant, S: 708 m[IU]/mL — ABNORMAL HIGH

## 2024-05-12 NOTE — MAU Note (Signed)
 Bethany Avery is a 24 y.o. at [redacted]w[redacted]d here in MAU reporting: she's having LLQ cramping that's radiating into her lower back that began yesterday.  Also reports has anterior right leg pain that travels up into buttocks that began today.  Denies VB, had spotting yesterday morning.  Denies taking meds to treat pain.  LMP: 04/13/2024 Onset of complaint: yesterday Pain score: 7 abdomen, 9 back, 8leg & butt Vitals:   05/12/24 1129  BP: 121/74  Pulse: 83  Resp: 19  Temp: 98.2 F (36.8 C)  SpO2: 100%     FHT: NA  Lab orders placed from triage: UA

## 2024-05-12 NOTE — MAU Provider Note (Signed)
 " History     [id number]  Arrival date and time: 05/12/24 1059    Chief Complaint  Patient presents with   Abdominal Pain     HPI Bethany Avery is a 23 y.o. at [redacted]w[redacted]d who presents for ectopic rule out. She has been having some intermittent LLQ pain over the past few days and is worried about ectopic as she has had one in October of 2025. LMP 04/13/24. Denies VB, LOF. Asymptomatic at this time.       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 Fredia Fresh  INDICATION Growth U/S BPP weekly DELIVERY RECOMMENDATION (GA)  Diabetes   A1 - good control     A2 - good control     A2  - poor control or poor compliance    (Macrosomia or polyhydramnios)     A2/B and B-C Pregestational Type II DM    Poor control B-C or D-R-F-T  or  Type I DM     Gestational hypertension 07/03/2020   Gonorrhea 02/02/2018   Headache    Ovarian cyst     Past Surgical History:  Procedure Laterality Date   CESAREAN SECTION N/A 12/02/2020   Procedure: CESAREAN SECTION;  Surgeon: Lola Donnice HERO, MD;  Location: [facility];  Service: Obstetrics;  Laterality: N/A;   LAPAROTOMY Right 09/05/2022   Procedure: EXPLORATORY LAPAROTOMY WITH RESECTION OF CORNUAL ECTOPIC PREGNANCY;  Surgeon: Herchel Gloris LABOR, MD;  Location: [facility] OR;  Service: Gynecology;  Laterality: Right;   UNILATERAL SALPINGECTOMY Right 09/05/2022   Procedure: UNILATERAL SALPINGECTOMY;  Surgeon: Herchel Gloris LABOR, MD;  Location: [facility] OR;  Service: Gynecology;  Laterality: Right;    Family History  Problem Relation Age of Onset   Hypertension Mother    Healthy Father     Social  History   Socioeconomic History   Marital status: Significant Other    Spouse name: Not on file   Number of children: Not on file   Years of education: Not on file   Highest education level: 10th grade  Occupational History   Not on file  Tobacco Use   Smoking status: Never   Smokeless tobacco: Never  Vaping Use   Vaping status: Never Used  Substance and Sexual Activity   Alcohol use: Yes   Drug use: Yes    Types: Marijuana   Sexual activity: Yes    Birth control/protection: None  Other Topics Concern   Not on file  Social History Narrative   Not on file   Social Drivers of Health   Tobacco Use: Low Risk (05/09/2024)   Patient History    Smoking Tobacco Use: Never    Smokeless Tobacco Use: Never    Passive Exposure: Not on file  Financial Resource Strain: Low Risk (11/25/2023)   Received from [facility]   Overall Financial Resource Strain (CARDIA)    Difficulty of Paying Living Expenses: Not hard at all  Food Insecurity: No Food Insecurity (11/25/2023)   Received from [facility]  Epic    Within the past 12 months, you worried that your food would run out before you got the money to buy more.: Never true    Within the past 12 months, the food you bought just didn't last and you didn't have money to get more.: Never true  Transportation Needs: No Transportation Needs (11/25/2023)   Received from [facility]   PRAPARE - Transportation    Lack of Transportation (Medical): No    Lack of Transportation (Non-Medical): No  Physical Activity: Inactive (11/25/2023)   Received from [facility]   Exercise Vital Sign    On average, how many days per week do you engage in moderate to strenuous exercise (like a brisk walk)?: 0 days    On average, how many minutes do you engage in exercise at this level?: 20 min  Stress: No Stress Concern Present (11/25/2023)   Received from [facility] - Occupational Stress Questionnaire    Feeling  of Stress : Only a little  Social Connections: Socially Integrated (11/25/2023)   Received from [facility]   Social Network    How would you rate your social network (family, work, friends)?: Good participation with social networks  Intimate Partner Violence: Not At Risk (11/25/2023)   Received from [facility]   HITS    Over the last 12 months how often did your partner physically hurt you?: Never    Over the last 12 months how often did your partner insult you or talk down to you?: Never    Over the last 12 months how often did your partner threaten you with physical harm?: Never    Over the last 12 months how often did your partner scream or curse at you?: Never  Depression (PHQ2-9): Medium Risk (01/29/2023)   Depression (PHQ2-9)    PHQ-2 Score: 8  Alcohol Screen: Not on file  Housing: Low Risk (11/25/2023)   Received from [facility]    In the last 12 months, was there a time when you were not able to pay the mortgage or rent on time?: No    In the past 12 months, how many times have you moved where you were living?: 0    At any time in the past 12 months, were you homeless or living in a shelter (including now)?: No  Utilities: Not At Risk (11/25/2023)   Received from [facility] Utilities    Threatened with loss of utilities: No  Health Literacy: Not on file    Allergies[1]  Medications Ordered Prior to Encounter[2]  Pertinent positives and negative per HPI, all others reviewed and negative  Physical Exam   BP 121/74 (BP Location: Right Arm)   Pulse 83   Temp 98.2 F (36.8 C) (Oral)   Resp 19   Ht 5' 5 (1.651 m)   Wt 108.7 kg   LMP 04/13/2024 (Exact Date)   SpO2 100%   BMI 39.87 kg/m   Patient Vitals for the past 24 hrs:  BP Temp Temp src Pulse Resp SpO2 Height Weight  05/12/24 1129 121/74 98.2 F (36.8 C) Oral 83 19 100 % -- --  05/12/24 1119 -- -- -- -- -- -- 5' 5 (1.651 m) 108.7 kg    Physical Exam Vitals and nursing note reviewed.   Constitutional:      Appearance: She is well-developed.  HENT:     Head: Normocephalic and atraumatic.  Mouth/Throat:     Mouth: Mucous membranes are moist.  Eyes:     Extraocular Movements: Extraocular movements intact.  Cardiovascular:     Rate and Rhythm: Normal rate and regular rhythm.  Pulmonary:     Effort: Pulmonary effort is normal.  Abdominal:     Palpations: Abdomen is soft.     Tenderness: There is no abdominal tenderness.  Skin:    Capillary Refill: Capillary refill takes less than 2 seconds.  Neurological:     General: No focal deficit present.     Mental Status: She is alert.     Labs Results for orders placed or performed during the hospital encounter of 05/12/24 (from the past 24 hours)  Urinalysis, Routine w reflex microscopic -Urine, Clean Catch     Status: Abnormal   Collection Time: 05/12/24 11:36 AM  Result Value Ref Range   Color, Urine YELLOW YELLOW   APPearance CLEAR CLEAR   Specific Gravity, Urine >1.030 (H) 1.005 - 1.030   pH 6.0 5.0 - 8.0   Glucose, UA NEGATIVE NEGATIVE mg/dL   Hgb urine dipstick NEGATIVE NEGATIVE   Bilirubin Urine NEGATIVE NEGATIVE   Ketones, ur NEGATIVE NEGATIVE mg/dL   Protein, ur NEGATIVE NEGATIVE mg/dL   Nitrite NEGATIVE NEGATIVE   Leukocytes,Ua NEGATIVE NEGATIVE  Wet prep, genital     Status: None   Collection Time: 05/12/24 11:46 AM  Result Value Ref Range   Yeast Wet Prep HPF POC NONE SEEN NONE SEEN   Trich, Wet Prep NONE SEEN NONE SEEN   Clue Cells Wet Prep HPF POC NONE SEEN NONE SEEN   WBC, Wet Prep HPF POC <10 <10   Sperm NONE SEEN   CBC     Status: Abnormal   Collection Time: 05/12/24 12:18 PM  Result Value Ref Range   WBC 9.2 4.0 - 10.5 K/uL   RBC 4.16 3.87 - 5.11 MIL/uL   Hemoglobin 13.0 12.0 - 15.0 g/dL   HCT 61.8 63.9 - 53.9 %   MCV 91.6 80.0 - 100.0 fL   MCH 31.3 26.0 - 34.0 pg   MCHC 34.1 30.0 - 36.0 g/dL   RDW 87.5 88.4 - 84.4 %   Platelets 430 (H) 150 - 400 K/uL   nRBC 0.0 0.0 - 0.2 %  hCG,  quantitative, pregnancy     Status: Abnormal   Collection Time: 05/12/24 12:18 PM  Result Value Ref Range   hCG, Beta Chain, Quant, S 708 (H) <5 mIU/mL    Imaging US  OB LESS THAN 14 WEEKS WITH OB TRANSVAGINAL Result Date: 05/12/2024 EXAM: OBSTETRIC ULTRASOUND FIRST TRIMESTER TECHNIQUE: Transvaginal first trimester obstetric pelvic duplex ultrasound was performed with real-time imaging, color flow Doppler imaging, and spectral analysis. COMPARISON: US  Pregnancy 09/05/2022. CLINICAL HISTORY: Pregnancy, location unknown. FINDINGS: UTERUS: No focal myometrial mass. GESTATIONAL SAC(S): 1.9 mm cystic area within the endometrial cavity likely represents a small gestational sac. No subchorionic hemorrhage. YOLK SAC: Not present. EMBRYO(<11WK) /FETUS(>=11WK): Not present. RATE OF CARDIAC ACTIVITY: Not present. RIGHT OVARY: Unremarkable. Normal arterial and venous flow. LEFT OVARY: Unremarkable. Normal arterial and venous flow. FREE FLUID: No free fluid. MEASUREMENTS ESTIMATED GESTATIONAL AGE BY CURRENT ULTRASOUND: Not determined due to lack of yolk sac, embryo, or cardiac activity. ESTIMATED GESTATIONAL AGE BY LMP/PRIOR ULTRASOUND: Based on LMP 04/13/2024, the gestational age is approximately 4 weeks and 1 day. ESTIMATED DUE DATE: Not determined due to lack of yolk sac, embryo, or cardiac activity. IMPRESSION: 1. Probable early intrauterine pregnancy with a 1.9 mm gestational  sac and no yolk sac, embryo, or cardiac activity identified. 2. Recommend follow-up with serial beta hCG and repeat ultrasound as indicated. Electronically signed by: Lonni Necessary MD 05/12/2024 02:59 PM EST RP Workstation: [id number]    MAU Course  Procedures  Lab Orders         Wet prep, genital         Urinalysis, Routine w reflex microscopic -Urine, Clean Catch         CBC         hCG, quantitative, pregnancy    No orders of the defined types were placed in this encounter.   Imaging Orders         US  OB LESS THAN 14  WEEKS WITH OB TRANSVAGINAL     MDM Moderate (Level 3-4)  Assessment and Plan  [redacted] weeks gestation of pregnancy  Abdominal cramping  Bethany Avery is a 23 y.o. at [redacted]w[redacted]d who presents for ectopic rule out.   -U/A and wet prep not concerning for infectious process -GC/Ch pending -CBC with no evidence of anemia -HCG 708 which is consistent with US  findings -US  shows probable early intrauterine pregnancy with a 1.9 mm gestational sac and no yolk sac, embryo, or cardiac activity identified.  -Recommend follow up every 48 hours with serial HCG's to ensure proper rise. Patient is familiar with this protocol as she had to do it with her previous ectopic. -All questions answered, anticipatory guidance and detailed bleeding/ectopic precautions provided.  -Stable for discharge home      Tomasa Dobransky L Brianah Hopson, MD/MHA 05/12/2024 3:09 PM  Allergies as of 05/12/2024       Reactions   Nickel Rash        Medication List     STOP taking these medications    baclofen  10 MG tablet Commonly known as: LIORESAL    diclofenac  50 MG EC tablet Commonly known as: VOLTAREN    ondansetron  4 MG disintegrating tablet Commonly known as: ZOFRAN -ODT   Wegovy 0.5 MG/0.5ML Soaj SQ injection Generic drug: semaglutide-weight management       TAKE these medications    albuterol  108 (90 Base) MCG/ACT inhaler Commonly known as: VENTOLIN  HFA Inhale 2 puffs into the lungs every 6 (six) hours as needed for wheezing.   multivitamin-prenatal 27-0.8 MG Tabs tablet Take 1 tablet by mouth daily at 12 noon.           [1]  Allergies Allergen Reactions   Nickel Rash  [2]  No current facility-administered medications on file prior to encounter.   Current Outpatient Medications on File Prior to Encounter  Medication Sig Dispense Refill   albuterol  (VENTOLIN  HFA) 108 (90 Base) MCG/ACT inhaler Inhale 2 puffs into the lungs every 6 (six) hours as needed for wheezing.     Prenatal Vit-Fe  Fumarate-FA (MULTIVITAMIN-PRENATAL) 27-0.8 MG TABS tablet Take 1 tablet by mouth daily at 12 noon. 30 tablet 0   baclofen  (LIORESAL ) 10 MG tablet Take 1 tablet (10 mg total) by mouth 3 (three) times daily. 30 each 0   diclofenac  (VOLTAREN ) 50 MG EC tablet Take 1 tablet (50 mg total) by mouth 2 (two) times daily. 30 tablet 1   ondansetron  (ZOFRAN -ODT) 4 MG disintegrating tablet Take 1 tablet (4 mg total) by mouth every 8 (eight) hours as needed for nausea or vomiting. 20 tablet 0   WEGOVY 0.5 MG/0.5ML SOAJ SQ injection Inject 0.5 mg into the skin once a week. (Patient not taking: Reported on 05/03/2024)     [DISCONTINUED] metoCLOPramide  (REGLAN )  10 MG tablet Take 1 tablet (10 mg total) by mouth every 8 (eight) hours as needed (take with tylenol  for headaches). 30 tablet 0   "

## 2024-05-14 ENCOUNTER — Inpatient Hospital Stay (HOSPITAL_COMMUNITY): Attending: Registered Nurse

## 2024-05-15 LAB — GC/CHLAMYDIA PROBE AMP (~~LOC~~) NOT AT ARMC
Chlamydia: NEGATIVE
Comment: NEGATIVE
Comment: NORMAL
Neisseria Gonorrhea: NEGATIVE

## 2024-05-22 ENCOUNTER — Inpatient Hospital Stay (HOSPITAL_COMMUNITY)

## 2024-05-22 ENCOUNTER — Encounter (HOSPITAL_COMMUNITY): Payer: Self-pay | Admitting: Obstetrics & Gynecology

## 2024-05-22 ENCOUNTER — Other Ambulatory Visit: Payer: Self-pay

## 2024-05-22 ENCOUNTER — Inpatient Hospital Stay (HOSPITAL_COMMUNITY)
Admission: AD | Admit: 2024-05-22 | Discharge: 2024-05-22 | Disposition: A | Discharge status: Home / Self Care | Attending: Obstetrics & Gynecology | Admitting: Obstetrics & Gynecology

## 2024-05-22 DIAGNOSIS — O26891 Other specified pregnancy related conditions, first trimester: Secondary | ICD-10-CM

## 2024-05-22 DIAGNOSIS — Z3A01 Less than 8 weeks gestation of pregnancy: Secondary | ICD-10-CM | POA: Diagnosis not present

## 2024-05-22 DIAGNOSIS — O3680X0 Pregnancy with inconclusive fetal viability, not applicable or unspecified: Secondary | ICD-10-CM | POA: Insufficient documentation

## 2024-05-22 DIAGNOSIS — R109 Unspecified abdominal pain: Secondary | ICD-10-CM

## 2024-05-22 DIAGNOSIS — O30041 Twin pregnancy, dichorionic/diamniotic, first trimester: Secondary | ICD-10-CM | POA: Insufficient documentation

## 2024-05-22 LAB — URINALYSIS, ROUTINE W REFLEX MICROSCOPIC
Bilirubin Urine: NEGATIVE
Glucose, UA: NEGATIVE mg/dL
Hgb urine dipstick: NEGATIVE
Ketones, ur: 5 mg/dL — AB
Leukocytes,Ua: NEGATIVE
Nitrite: NEGATIVE
Protein, ur: 30 mg/dL — AB
Specific Gravity, Urine: 1.026 (ref 1.005–1.030)
pH: 7 (ref 5.0–8.0)

## 2024-05-22 LAB — HCG, QUANTITATIVE, PREGNANCY: hCG, Beta Chain, Quant, S: 27312 m[IU]/mL — ABNORMAL HIGH

## 2024-05-22 NOTE — MAU Provider Note (Signed)
 " History     CSN: [id number]  Arrival date and time: 05/22/24 9283   Event Date/Time   First Provider Initiated Contact with Patient 05/22/24 [phone]      Chief Complaint  Patient presents with   Abdominal Pain   HPI [name] is a 23 y.o. year old [id number] female at [redacted]w[redacted]d weeks gestation who presents to MAU reporting left-sided cramping since the last time she was seen in MAU on 05/12/2024.  She only has her left tube as a result of a right salpingectomy after an ectopic pregnancy on 09/05/2022 (by Dr. Herchel).  She reports her pain as 7/10; no meds have been taken for the pain.  She was supposed to follow-up in MAU last Sunday, 05/14/2024, but was not able to come due to sickness of her and her son.  She reports having no energy; works 15-hour shifts.   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 Fredia Fresh  INDICATION Growth U/S BPP weekly DELIVERY RECOMMENDATION (GA)  Diabetes   A1 - good control     A2 - good control     A2  - poor control or poor compliance    (Macrosomia or polyhydramnios)     A2/B and B-C Pregestational Type II DM    Poor control B-C or D-R-F-T  or  Type I DM     Gestational hypertension 07/03/2020   Gonorrhea 02/02/2018   Headache    Ovarian cyst     Past Surgical History:  Procedure Laterality Date   CESAREAN SECTION N/A 12/02/2020   Procedure: CESAREAN SECTION;  Surgeon: Lola Donnice HERO, MD;   Location: [facility]S;  Service: Obstetrics;  Laterality: N/A;   LAPAROTOMY Right 09/05/2022   Procedure: EXPLORATORY LAPAROTOMY WITH RESECTION OF CORNUAL ECTOPIC PREGNANCY;  Surgeon: Herchel Gloris LABOR, MD;  Location: [facility] OR;  Service: Gynecology;  Laterality: Right;   UNILATERAL SALPINGECTOMY Right 09/05/2022   Procedure: UNILATERAL SALPINGECTOMY;  Surgeon: Herchel Gloris LABOR, MD;  Location: [facility] OR;  Service: Gynecology;  Laterality: Right;    Family History  Problem Relation Age of Onset   Hypertension Mother    Healthy Father     Social History[1]  Allergies: Allergies[2]  Medications Prior to Admission  Medication Sig Dispense Refill Last Dose/Taking   albuterol  (VENTOLIN  HFA) 108 (90 Base) MCG/ACT inhaler Inhale 2 puffs into the lungs every 6 (six) hours as needed for wheezing.      Prenatal Vit-Fe Fumarate-FA (MULTIVITAMIN-PRENATAL) 27-0.8 MG TABS tablet Take 1 tablet by mouth daily at 12 noon. 30 tablet  0     Review of Systems  Constitutional: Negative.   HENT: Negative.    Eyes: Negative.   Respiratory: Negative.    Cardiovascular: Negative.   Gastrointestinal: Negative.   Endocrine: Negative.   Genitourinary:  Positive for pelvic pain (LT sided cramping since being in MAU 05/12/2024).  Musculoskeletal: Negative.   Skin: Negative.   Allergic/Immunologic: Negative.   Neurological: Negative.   Hematological: Negative.   Psychiatric/Behavioral: Negative.     Physical Exam   Blood pressure 115/64, pulse 79, resp. rate 16, height 5' 5 (1.651 m), weight 109.5 kg, last menstrual period 04/13/2024, SpO2 99%.  Physical Exam Vitals and nursing note reviewed.  Constitutional:      Appearance: Normal appearance. She is obese.  Cardiovascular:     Rate and Rhythm: Normal rate.  Pulmonary:     Effort: Pulmonary effort is normal.  Genitourinary:    Comments: deferred Musculoskeletal:        General: Normal range of motion.  Neurological:     Mental Status: She is alert  and oriented to person, place, and time.  Psychiatric:        Mood and Affect: Mood normal.        Behavior: Behavior normal.        Thought Content: Thought content normal.        Judgment: Judgment normal.    MAU Course  Procedures  MDM CCUA HCG OB TVUS  Results for orders placed or performed during the hospital encounter of 05/22/24 (from the past 24 hours)  Urinalysis, Routine w reflex microscopic -Urine, Clean Catch     Status: Abnormal   Collection Time: 05/22/24  7:33 AM  Result Value Ref Range   Color, Urine YELLOW YELLOW   APPearance HAZY (A) CLEAR   Specific Gravity, Urine 1.026 1.005 - 1.030   pH 7.0 5.0 - 8.0   Glucose, UA NEGATIVE NEGATIVE mg/dL   Hgb urine dipstick NEGATIVE NEGATIVE   Bilirubin Urine NEGATIVE NEGATIVE   Ketones, ur 5 (A) NEGATIVE mg/dL   Protein, ur 30 (A) NEGATIVE mg/dL   Nitrite NEGATIVE NEGATIVE   Leukocytes,Ua NEGATIVE NEGATIVE   RBC / HPF 0-5 0 - 5 RBC/hpf   WBC, UA 0-5 0 - 5 WBC/hpf   Bacteria, UA RARE (A) NONE SEEN   Squamous Epithelial / HPF 0-5 0 - 5 /HPF   Mucus PRESENT   hCG, quantitative, pregnancy     Status: Abnormal   Collection Time: 05/22/24  8:07 AM  Result Value Ref Range   hCG, Beta Chain, Quant, S 27,312 (H) <5 mIU/mL    US  OB Transvaginal Result Date: 05/22/2024 EXAM: OBSTETRIC ULTRASOUND FIRST TRIMESTER TECHNIQUE: Transvaginal first trimester obstetric pelvic duplex ultrasound was performed with real-time imaging, color flow Doppler imaging, and spectral analysis. COMPARISON: None available. CLINICAL HISTORY: abdominal pain in pregnancy, pregnancy of unknown location. Gestational age by last menstrual period of 5 weeks and 4 days. Estimated due date by last menstrual period: 01/18/2025. FINDINGS: UTERUS: No focal myometrial mass. GESTATIONAL SAC(S): Twin intrauterine pregnancy. Dichorionic diamniotic findings for chorionicity and amnioticity. No subchorionic hemorrhage. Fetus 1 demonstrates a yolk sac of 2.9 mm. Embryo  and cardiac activity are not observed. Mean sac diameter is 10.6 mm, which corresponds to a gestational age by ultrasound of 5 weeks and 6 days. Fetus 2 demonstrates a yolk sac of 3.1 mm. Embryo and cardiac activity are not observed. Mean sagittal diameter is 10.5 mm, which corresponds to a gestational age by ultrasound of  5 weeks and 6 days. YOLK SAC: Present (detailed for each fetus above). EMBRYO(<11WK) /FETUS(>=11WK): Not observed for either fetus. CROWN RUMP LENGTH: Not measured. RATE OF CARDIAC ACTIVITY: Not measured. RIGHT OVARY: Unremarkable. Normal arterial and venous flow. LEFT OVARY: Unremarkable. Normal arterial and venous flow. FREE FLUID: No free fluid. No adnexal mass. MEASUREMENTS ESTIMATED GESTATIONAL AGE BY CURRENT ULTRASOUND: 5 weeks and 6 days (for both fetuses). ESTIMATED GESTATIONAL AGE BY LMP/PRIOR ULTRASOUND: 5 weeks and 4 days. ESTIMATED DUE DATE: 01/18/2025. IMPRESSION: 1. Twin intrauterine dichorionic diamniotic pregnancy with yolk sacs identified in both gestational sacs. No embryos or cardiac activity observed in either gestational sac likely due to early gestation. Gestational age by ultrasound is 5 weeks and 6 days which is congruent with gestational age by last menstrual period of 5 weeks and 4 days. Recommend trending of beta-hcg and repeat ultrasound in 7 to 10 days to evaluate for viability. Electronically signed by: Morgane Naveau MD 05/22/2024 11:50 AM EST RP Workstation: [id number]   Assessment and Plan  1. Abdominal pain during pregnancy in first trimester (Primary) - Information provided on abdominal pain in pregnancy  2. Pregnancy with uncertain fetal viability, single or unspecified fetus Comments: uncertain viability of both twins - Advised will need a follow-up ultrasound in 2 weeks  3. Dichorionic diamniotic twin pregnancy in first trimester - Information provided on multiple pregnancies  4. [redacted] weeks gestation of pregnancy   - Discharge home - Needs to  schedule an appt with [facility] in 2 weeks. Message sent to [facility] to get patient scheduled for viability ultrasound and to see a provider immediately after the scan in 2 weeks. - Patient verbalized an understanding of the plan of care and agrees.   Ala Cart, CNM 05/22/2024, 8:33 AM      [1]  Social History Tobacco Use   Smoking status: Never   Smokeless tobacco: Never  Vaping Use   Vaping status: Never Used  Substance Use Topics   Alcohol use: Yes   Drug use: Yes    Types: Marijuana  [2]  Allergies Allergen Reactions   Nickel Rash   "

## 2024-05-22 NOTE — MAU Note (Signed)
.  Bethany Avery is a 24 y.o. at [redacted]w[redacted]d here in MAU reporting: cramping in left side states that she only has her left tube left. Patient reports she was suppose to come back to MAU last Sunday, but pt was sick and her son was sick. Patient reports she has no energy and works 15 hour shifts r  Onset of complaint: Tuesday last week  Pain score: 7/10 Vitals:   05/22/24 0735  BP: 115/64  Pulse: 79  Resp: 16  SpO2: 99%      Lab orders placed from triage:   ua

## 2024-05-22 NOTE — Discharge Instructions (Signed)

## 2024-05-27 ENCOUNTER — Emergency Department (HOSPITAL_COMMUNITY)
Admission: EM | Admit: 2024-05-27 | Discharge: 2024-05-27 | Discharge status: Against Medical Advice | Source: Home / Self Care

## 2024-05-27 DIAGNOSIS — O209 Hemorrhage in early pregnancy, unspecified: Secondary | ICD-10-CM | POA: Insufficient documentation

## 2024-05-27 DIAGNOSIS — Z3A01 Less than 8 weeks gestation of pregnancy: Secondary | ICD-10-CM | POA: Insufficient documentation

## 2024-05-27 DIAGNOSIS — Z5321 Procedure and treatment not carried out due to patient leaving prior to being seen by health care provider: Secondary | ICD-10-CM | POA: Insufficient documentation

## 2024-05-27 LAB — COMPREHENSIVE METABOLIC PANEL WITH GFR
ALT: 15 U/L (ref 0–44)
AST: 19 U/L (ref 15–41)
Albumin: 4.1 g/dL (ref 3.5–5.0)
Alkaline Phosphatase: 96 U/L (ref 38–126)
Anion gap: 10 (ref 5–15)
BUN: 7 mg/dL (ref 6–20)
CO2: 24 mmol/L (ref 22–32)
Calcium: 9.4 mg/dL (ref 8.9–10.3)
Chloride: 102 mmol/L (ref 98–111)
Creatinine, Ser: 0.61 mg/dL (ref 0.44–1.00)
GFR, Estimated: >60 mL/min
Glucose, Bld: 107 mg/dL — ABNORMAL HIGH (ref 70–99)
Potassium: 3.9 mmol/L (ref 3.5–5.1)
Sodium: 135 mmol/L (ref 135–145)
Total Bilirubin: 0.4 mg/dL (ref 0.0–1.2)
Total Protein: 7.7 g/dL (ref 6.5–8.1)

## 2024-05-27 LAB — CBC WITH DIFFERENTIAL/PLATELET
Abs Immature Granulocytes: 0.05 K/uL (ref 0.00–0.07)
Basophils Absolute: 0 K/uL (ref 0.0–0.1)
Basophils Relative: 0 %
Eosinophils Absolute: 0.1 K/uL (ref 0.0–0.5)
Eosinophils Relative: 1 %
HCT: 39.7 % (ref 36.0–46.0)
Hemoglobin: 13.3 g/dL (ref 12.0–15.0)
Immature Granulocytes: 1 %
Lymphocytes Relative: 19 %
Lymphs Abs: 1.9 K/uL (ref 0.7–4.0)
MCH: 31.1 pg (ref 26.0–34.0)
MCHC: 33.5 g/dL (ref 30.0–36.0)
MCV: 92.8 fL (ref 80.0–100.0)
Monocytes Absolute: 0.6 K/uL (ref 0.1–1.0)
Monocytes Relative: 6 %
Neutro Abs: 7.2 K/uL (ref 1.7–7.7)
Neutrophils Relative %: 73 %
Platelets: 406 K/uL — ABNORMAL HIGH (ref 150–400)
RBC: 4.28 MIL/uL (ref 3.87–5.11)
RDW: 12.6 % (ref 11.5–15.5)
WBC: 9.9 K/uL (ref 4.0–10.5)
nRBC: 0 % (ref 0.0–0.2)

## 2024-05-27 LAB — URINALYSIS, ROUTINE W REFLEX MICROSCOPIC
Bilirubin Urine: NEGATIVE
Glucose, UA: NEGATIVE mg/dL
Hgb urine dipstick: NEGATIVE
Ketones, ur: NEGATIVE mg/dL
Leukocytes,Ua: NEGATIVE
Nitrite: NEGATIVE
Protein, ur: NEGATIVE mg/dL
Specific Gravity, Urine: 1.024 (ref 1.005–1.030)
pH: 6 (ref 5.0–8.0)

## 2024-05-27 LAB — HCG, QUANTITATIVE, PREGNANCY: hCG, Beta Chain, Quant, S: 67706 m[IU]/mL — ABNORMAL HIGH

## 2024-05-27 NOTE — ED Notes (Signed)
 Patient had to leave AMA she had to go pick up her son. States she will be back later on.

## 2024-05-27 NOTE — ED Triage Notes (Addendum)
 Patient reports she is pregnant with twins Patient says she has one tube and her pregnancy is high risk Patient says her back hurts Patient says she has had cramping since yesterday Patient says she went to MAU on 05/22/24 but states they did not assess her the way she wanted to be assessed

## 2024-05-27 NOTE — ED Notes (Signed)
 Patient states she has had spotting over last 1-2 days . Light pink

## 2024-06-01 ENCOUNTER — Other Ambulatory Visit: Payer: Self-pay | Admitting: *Deleted

## 2024-06-01 DIAGNOSIS — O3680X0 Pregnancy with inconclusive fetal viability, not applicable or unspecified: Secondary | ICD-10-CM

## 2024-06-05 ENCOUNTER — Ambulatory Visit: Payer: Self-pay | Admitting: Obstetrics & Gynecology

## 2024-06-05 ENCOUNTER — Other Ambulatory Visit

## 2024-06-05 ENCOUNTER — Other Ambulatory Visit: Payer: Self-pay | Admitting: Obstetrics and Gynecology

## 2024-06-05 ENCOUNTER — Other Ambulatory Visit: Payer: Self-pay

## 2024-06-05 DIAGNOSIS — O3680X0 Pregnancy with inconclusive fetal viability, not applicable or unspecified: Secondary | ICD-10-CM

## 2024-06-05 DIAGNOSIS — Z3491 Encounter for supervision of normal pregnancy, unspecified, first trimester: Secondary | ICD-10-CM | POA: Diagnosis not present

## 2024-06-05 DIAGNOSIS — Z3A01 Less than 8 weeks gestation of pregnancy: Secondary | ICD-10-CM

## 2024-06-22 ENCOUNTER — Telehealth: Payer: Self-pay

## 2024-06-27 ENCOUNTER — Encounter: Payer: Self-pay | Admitting: Student
